# Patient Record
Sex: Female | Born: 1940 | Race: White | Hispanic: No | Smoking: Never smoker
Health system: Southern US, Community
[De-identification: ages and names within clinical notes are randomized; demographics above are authoritative.]

## PROBLEM LIST (undated history)

## (undated) DIAGNOSIS — K649 Unspecified hemorrhoids: Secondary | ICD-10-CM

## (undated) DIAGNOSIS — B029 Zoster without complications: Secondary | ICD-10-CM

## (undated) DIAGNOSIS — I10 Essential (primary) hypertension: Secondary | ICD-10-CM

## (undated) DIAGNOSIS — Z8719 Personal history of other diseases of the digestive system: Secondary | ICD-10-CM

## (undated) DIAGNOSIS — K635 Polyp of colon: Secondary | ICD-10-CM

## (undated) DIAGNOSIS — Z87898 Personal history of other specified conditions: Secondary | ICD-10-CM

## (undated) DIAGNOSIS — F419 Anxiety disorder, unspecified: Secondary | ICD-10-CM

## (undated) DIAGNOSIS — K5900 Constipation, unspecified: Secondary | ICD-10-CM

## (undated) DIAGNOSIS — M8580 Other specified disorders of bone density and structure, unspecified site: Secondary | ICD-10-CM

## (undated) DIAGNOSIS — Z91030 Bee allergy status: Secondary | ICD-10-CM

## (undated) DIAGNOSIS — M109 Gout, unspecified: Secondary | ICD-10-CM

## (undated) DIAGNOSIS — E871 Hypo-osmolality and hyponatremia: Secondary | ICD-10-CM

## (undated) DIAGNOSIS — E785 Hyperlipidemia, unspecified: Secondary | ICD-10-CM

## (undated) DIAGNOSIS — I4891 Unspecified atrial fibrillation: Secondary | ICD-10-CM

## (undated) HISTORY — DX: Unspecified hemorrhoids: K64.9

## (undated) HISTORY — DX: Other specified disorders of bone density and structure, unspecified site: M85.80

## (undated) HISTORY — DX: Zoster without complications: B02.9

## (undated) HISTORY — DX: Bee allergy status: Z91.030

## (undated) HISTORY — DX: Personal history of other diseases of the digestive system: Z87.19

## (undated) HISTORY — DX: Gout, unspecified: M10.9

## (undated) HISTORY — DX: Personal history of other specified conditions: Z87.898

## (undated) HISTORY — DX: Hypo-osmolality and hyponatremia: E87.1

## (undated) HISTORY — DX: Essential (primary) hypertension: I10

## (undated) HISTORY — DX: Anxiety disorder, unspecified: F41.9

## (undated) HISTORY — DX: Constipation, unspecified: K59.00

## (undated) HISTORY — DX: Hyperlipidemia, unspecified: E78.5

## (undated) HISTORY — DX: Polyp of colon: K63.5

---

## 1968-11-02 HISTORY — PX: ABDOMINAL HYSTERECTOMY: SHX81

## 1998-07-01 ENCOUNTER — Other Ambulatory Visit: Admission: RE | Admit: 1998-07-01 | Discharge: 1998-07-01 | Payer: Self-pay | Admitting: *Deleted

## 2000-03-15 ENCOUNTER — Encounter: Admission: RE | Admit: 2000-03-15 | Discharge: 2000-03-15 | Payer: Self-pay | Admitting: *Deleted

## 2001-03-16 ENCOUNTER — Encounter: Admission: RE | Admit: 2001-03-16 | Discharge: 2001-03-16 | Payer: Self-pay | Admitting: Family Medicine

## 2001-03-16 ENCOUNTER — Encounter: Payer: Self-pay | Admitting: Family Medicine

## 2002-03-30 ENCOUNTER — Encounter: Admission: RE | Admit: 2002-03-30 | Discharge: 2002-03-30 | Payer: Self-pay | Admitting: Family Medicine

## 2002-03-30 ENCOUNTER — Encounter: Payer: Self-pay | Admitting: Family Medicine

## 2003-03-20 ENCOUNTER — Encounter (INDEPENDENT_AMBULATORY_CARE_PROVIDER_SITE_OTHER): Payer: Self-pay | Admitting: Specialist

## 2003-03-20 ENCOUNTER — Ambulatory Visit (HOSPITAL_COMMUNITY): Admission: RE | Admit: 2003-03-20 | Discharge: 2003-03-20 | Payer: Self-pay | Admitting: Gastroenterology

## 2003-04-10 ENCOUNTER — Encounter: Payer: Self-pay | Admitting: Family Medicine

## 2003-04-10 ENCOUNTER — Encounter: Admission: RE | Admit: 2003-04-10 | Discharge: 2003-04-10 | Payer: Self-pay | Admitting: Family Medicine

## 2003-11-03 HISTORY — PX: BILATERAL SALPINGOOPHORECTOMY: SHX1223

## 2004-01-07 ENCOUNTER — Encounter: Admission: RE | Admit: 2004-01-07 | Discharge: 2004-01-07 | Payer: Self-pay | Admitting: Family Medicine

## 2004-01-23 ENCOUNTER — Other Ambulatory Visit: Admission: RE | Admit: 2004-01-23 | Discharge: 2004-01-23 | Payer: Self-pay | Admitting: Obstetrics and Gynecology

## 2004-01-30 ENCOUNTER — Ambulatory Visit: Admission: RE | Admit: 2004-01-30 | Discharge: 2004-01-30 | Payer: Self-pay | Admitting: Gynecology

## 2004-02-21 ENCOUNTER — Ambulatory Visit (HOSPITAL_COMMUNITY): Admission: RE | Admit: 2004-02-21 | Discharge: 2004-02-21 | Payer: Self-pay | Admitting: Gynecology

## 2004-02-26 ENCOUNTER — Ambulatory Visit (HOSPITAL_COMMUNITY): Admission: RE | Admit: 2004-02-26 | Discharge: 2004-02-26 | Payer: Self-pay | Admitting: Gynecology

## 2004-02-26 ENCOUNTER — Encounter (INDEPENDENT_AMBULATORY_CARE_PROVIDER_SITE_OTHER): Payer: Self-pay | Admitting: Specialist

## 2004-03-25 ENCOUNTER — Ambulatory Visit: Admission: RE | Admit: 2004-03-25 | Discharge: 2004-03-25 | Payer: Self-pay | Admitting: Gynecology

## 2004-04-10 ENCOUNTER — Encounter: Admission: RE | Admit: 2004-04-10 | Discharge: 2004-04-10 | Payer: Self-pay | Admitting: Family Medicine

## 2005-04-22 ENCOUNTER — Encounter: Admission: RE | Admit: 2005-04-22 | Discharge: 2005-04-22 | Payer: Self-pay | Admitting: Family Medicine

## 2006-06-04 ENCOUNTER — Encounter: Admission: RE | Admit: 2006-06-04 | Discharge: 2006-06-04 | Payer: Self-pay | Admitting: Family Medicine

## 2007-06-20 ENCOUNTER — Encounter: Admission: RE | Admit: 2007-06-20 | Discharge: 2007-06-20 | Payer: Self-pay | Admitting: Family Medicine

## 2008-06-20 ENCOUNTER — Encounter: Admission: RE | Admit: 2008-06-20 | Discharge: 2008-06-20 | Payer: Self-pay | Admitting: Family Medicine

## 2008-06-27 ENCOUNTER — Encounter: Admission: RE | Admit: 2008-06-27 | Discharge: 2008-06-27 | Payer: Self-pay | Admitting: Family Medicine

## 2009-03-01 ENCOUNTER — Ambulatory Visit (HOSPITAL_COMMUNITY): Admission: RE | Admit: 2009-03-01 | Discharge: 2009-03-01 | Payer: Self-pay | Admitting: Family Medicine

## 2009-06-28 ENCOUNTER — Ambulatory Visit (HOSPITAL_COMMUNITY): Admission: RE | Admit: 2009-06-28 | Discharge: 2009-06-28 | Payer: Self-pay | Admitting: Family Medicine

## 2009-07-02 ENCOUNTER — Encounter: Admission: RE | Admit: 2009-07-02 | Discharge: 2009-07-02 | Payer: Self-pay | Admitting: Diagnostic Radiology

## 2009-07-04 ENCOUNTER — Encounter: Admission: RE | Admit: 2009-07-04 | Discharge: 2009-07-04 | Payer: Self-pay | Admitting: Family Medicine

## 2009-11-07 ENCOUNTER — Emergency Department (HOSPITAL_COMMUNITY): Admission: EM | Admit: 2009-11-07 | Discharge: 2009-11-07 | Payer: Self-pay | Admitting: Emergency Medicine

## 2010-03-13 LAB — HM DEXA SCAN

## 2010-07-08 ENCOUNTER — Ambulatory Visit (HOSPITAL_COMMUNITY): Admission: RE | Admit: 2010-07-08 | Discharge: 2010-07-08 | Payer: Self-pay

## 2010-11-24 ENCOUNTER — Encounter: Payer: Self-pay | Admitting: Family Medicine

## 2011-01-18 LAB — DIFFERENTIAL
Basophils Absolute: 0.1 10*3/uL (ref 0.0–0.1)
Eosinophils Absolute: 0.2 10*3/uL (ref 0.0–0.7)
Eosinophils Relative: 1 % (ref 0–5)
Lymphocytes Relative: 31 % (ref 12–46)
Lymphs Abs: 3.6 10*3/uL (ref 0.7–4.0)
Monocytes Relative: 6 % (ref 3–12)
Neutro Abs: 7.1 10*3/uL (ref 1.7–7.7)
Neutrophils Relative %: 61 % (ref 43–77)

## 2011-01-18 LAB — BASIC METABOLIC PANEL
BUN: 14 mg/dL (ref 6–23)
CO2: 24 mEq/L (ref 19–32)
Calcium: 9.1 mg/dL (ref 8.4–10.5)
GFR calc Af Amer: >60 mL/min (ref >60)
Sodium: 132 mEq/L — ABNORMAL LOW (ref 135–145)

## 2011-01-18 LAB — CBC
MCV: 96.8 fL (ref 78.0–100.0)
RBC: 3.99 MIL/uL (ref 3.87–5.11)
WBC: 11.6 10*3/uL — ABNORMAL HIGH (ref 4.0–10.5)

## 2011-01-18 LAB — POCT CARDIAC MARKERS
CKMB, poc: 3.8 ng/mL (ref 1.0–8.0)
Myoglobin, poc: 277 ng/mL (ref 12–200)

## 2011-01-29 ENCOUNTER — Ambulatory Visit (INDEPENDENT_AMBULATORY_CARE_PROVIDER_SITE_OTHER): Payer: Medicare Other | Admitting: Family Medicine

## 2011-01-29 DIAGNOSIS — Z0000 Encounter for general adult medical examination without abnormal findings: Secondary | ICD-10-CM

## 2011-01-29 DIAGNOSIS — E559 Vitamin D deficiency, unspecified: Secondary | ICD-10-CM

## 2011-01-29 DIAGNOSIS — E871 Hypo-osmolality and hyponatremia: Secondary | ICD-10-CM

## 2011-01-29 DIAGNOSIS — Z01419 Encounter for gynecological examination (general) (routine) without abnormal findings: Secondary | ICD-10-CM

## 2011-01-29 DIAGNOSIS — I10 Essential (primary) hypertension: Secondary | ICD-10-CM

## 2011-03-20 NOTE — Op Note (Signed)
   NAME:  Monique James, Monique James                        ACCOUNT NO.:  [id number]   MEDICAL RECORD NO.:  [id number]                   PATIENT TYPE:  AMB   LOCATION:  ENDO                                 FACILITY:  [facility]   PHYSICIAN:  James L. Malon Kindle., M.D.          DATE OF BIRTH:  1941-01-17   DATE OF PROCEDURE:  03/16/2003  DATE OF DISCHARGE:                                 OPERATIVE REPORT   PROCEDURE:  Colonoscopy with coagulation of polyp.   MEDICATIONS:  Fentanyl 150 mcg, Versed 13 mg IV.   INDICATIONS FOR PROCEDURE:  The patient had a sigmoidoscopy done by Dr.  Manus Gunning with a small polyp biopsied at 40 cm that appears to be abnormal.  This procedure is done to evaluate the colon for further polyps.   DESCRIPTION OF PROCEDURE:  The procedure had been explained to the patient  and consent obtained. With the patient in the left lateral decubitus  position, the Olympus scope was inserted and advanced. The patient had a  long tortuous colon. It took some time to reach the cecum, finally in the  right lateral decubitus position, we were able to reach the cecum. The  ileocecal valve and appendiceal orifice were seen.  The scope was withdrawn  and the cecum, ascending colon, hepatic flexure, transverse colon, splenic  flexure, descending and sigmoid colon were seen well. After some time and  multiple positions, we were able to find the polyp it was about 3 mm and  sessile located about 40 cm. It was cauterized with the hot biopsy forceps.  The scope was withdrawn. There was no significant diverticular disease. No  other polyps were seen.   ASSESSMENT:  Sigmoid colon polyp cauterized.   PLAN:  Routine post polypectomy instructions. Would recommend repeating in  three years with the adult colonoscope.                                               James L. Malon Kindle., M.D.    Waldron Session  D:  03/20/2003  T:  03/20/2003  Job:  [id number]   cc:   Bryan Lemma. Manus Gunning, M.D.  [address]  [place]  [place] [address]  Fax: [phone]

## 2011-03-20 NOTE — Consult Note (Signed)
NAME:  Monique James, Monique James                        ACCOUNT NO.:  [id number]   MEDICAL RECORD NO.:  [id number]                   PATIENT TYPE:  OUT   LOCATION:  GYN                                  FACILITY:  [facility]   PHYSICIAN:  De Blanch, M.D.         DATE OF BIRTH:  Jan 20, 1941   DATE OF CONSULTATION:  03/25/2004  DATE OF DISCHARGE:                                   CONSULTATION   IDENTIFYING INFORMATION:  Return patient postoperative note.   REASON FOR CONSULTATION:  A 70 year old white female returns for  postoperative checkup.  She underwent laparoscopic bilateral salpingo-  oophorectomy on April 26th.  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  No evidence of malignancy was noted.  The patient has had  an uncomplicated postoperative course.  She denies any pelvic pain,  pressure, any incisional pain, appetite is good.  She has no GI or GU  symptoms.   PHYSICAL EXAMINATION:  ABDOMEN:  Soft, nontender.  No masses, organomegaly,  ascites, or hernias are noted.  Her 4 laparoscopic incisions are healing  well.  PELVIC:  EGBUS, vagina, bladder, urethra are normal.  Cervix and uterus  surgically absent.  Bimanual reveals no masses, nodularity, or fluid  collections.   IMPRESSION:  Status post bilateral salpingo-oophorectomy (laparoscopic) with  excellent postoperative recovery.  The patient is given the okay to return  to full levels of activity including that she can go return to work  tomorrow.   With regard to follow up, we return her to the care of her primary physician  for annual gynecologic exams and well-woman care.                                               De Blanch, M.D.    DC/MEDQ  D:  03/25/2004  T:  03/25/2004  Job:  [id number]   cc:   Telford Nab, R.N.  [address]  [place], [place] [address]

## 2011-03-20 NOTE — Consult Note (Signed)
NAME:  Monique James, Monique James                        ACCOUNT NO.:  [id number]   MEDICAL RECORD NO.:  [id number]                   PATIENT TYPE:  OUT   LOCATION:  GYN                                  FACILITY:  [facility]   PHYSICIAN:  De Blanch, M.D.         DATE OF BIRTH:  02/04/1941   DATE OF CONSULTATION:  01/30/2004  DATE OF DISCHARGE:  01/30/2004                                   CONSULTATION   REASON FOR CONSULTATION:  A 70 year old white female seen in consultation at  the request of Dr. Dois Davenport A. Rivard regarding management of complex pelvic  mass.  The patient was found to have an incidentally enlarged ovary on  annual exam.  Subsequently underwent an ultrasound examination that showed a  complex cystic structure measuring 5.8 x 5.4 x 6 cm.  There are internal  septations and at least one single nodule.  The patient is essentially  asymptomatic.   PAST MEDICAL HISTORY:  1. Hypertension.  2. Elevated cholesterol.   PAST SURGICAL HISTORY:  In 1971, total abdominal hysterectomy.   CURRENT MEDICATIONS:  1. Cardura.  2. Lotrel.  3. Premarin.  4. Baby aspirin.   ALLERGIES:  1. VANCOMYCIN.  2. IVP DYE.  3. CODEINE.  4. PENICILLIN (nausea).  5. EPINEPHRINE (elevated heart rate).  6. BEE STINGS.  7. SULFA (rash).   FAMILY HISTORY:  An aunt with breast cancer.  There is no other gynecologic  or colon cancer in the family history.   OBSTETRICAL HISTORY:  Gravida 3.   SOCIAL HISTORY:  The patient does not smoke.   REVIEW OF SYSTEMS:  Negative except as noted above.   PHYSICAL EXAMINATION:  VITAL SIGNS:  Weight 200 pounds, height 5 feet 5  inches, blood pressure 150/90.  GENERAL:  The patient is an obese white female in no acute distress.  HEENT:  Negative.  NECK:  Supple without thyromegaly.  There is no supraclavicular or inguinal  adenopathy.  ABDOMEN:  Obese, soft, nontender.  No mass, organomegaly, ascites, or  hernias noted.  The Pfannenstiel incision is  well-healed.  PELVIC:  EGBUS, vagina, bladder, and urethra are normal.  The cuff is white  female.  No lesions are noted.  Bimanual and rectovaginal exam reveal some  fullness at the apex of the vagina without a discrete mass which is probably  hidden by the patient's obesity.   IMPRESSION:  Complex pelvic mass in a menopausal patient.  Her CA-125 is  5.6.   PLAN:  I would recommend the patient undergo surgery to resect the mass.  I  think it would be reasonable to attempt this laparoscopically with the plan  of avoiding rupture by placing the ovary and cyst in an Endo bag and  extracting it through the suprapubic site.  The patient understands that if  this cannot be accomplished, laparotomy would be required.   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  The risks of surgery, including  hemorrhage, infection, injury to adjacent viscera, thrombo-embolic  complications, and anesthetic risks were all discussed with the patient.  She accepts these risks.  We will contact Dr. Estanislado Pandy and coordinate surgery.                                               De Blanch, M.D.    DC/MEDQ  D:  02/04/2004  T:  02/05/2004  Job:  [id number]   cc:   Dois Davenport A. Rivard, M.D.  [address]  [place]  [place] [address]  Fax: [phone]   Telford Nab, R.N.  [phone] [address]  [place], [place] [address]

## 2011-03-20 NOTE — Op Note (Signed)
NAME:  Monique James, Monique James                        ACCOUNT NO.:  [id number]   MEDICAL RECORD NO.:  [id number]                   PATIENT TYPE:  AMB   LOCATION:  DAY                                  FACILITY:  [facility]   PHYSICIAN:  De Blanch, M.D.         DATE OF BIRTH:  22-Feb-1941   DATE OF PROCEDURE:  DATE OF DISCHARGE:                                 OPERATIVE REPORT   PREOPERATIVE DIAGNOSIS:  Complex pelvic mass.   POSTOPERATIVE DIAGNOSIS:  Right ovarian cyst adenofibroma.   PROCEDURE:  Laparoscopic bilateral salpingo-oophorectomy and lysis of  adhesions.   SURGEON:  De Blanch, M.D.   ASSISTANT:  Telford Nab, R.N.   ANESTHESIA:  General with orotracheal tube.   ESTIMATED BLOOD LOSS:  10 cc.   SURGICAL FINDINGS:  At the time of laparoscopy, the upper abdomen, including  the liver, stomach, spleen, omentum, and diaphragms were normal.  The small  bowel appeared normal.  The right ovary was replaced by a 6 cm ovarian cyst,  which was densely adherent in the pelvic sidewall to the vaginal cuff.  The  left tube and ovary appeared essentially normal but were adherent to the  pelvic side wall as well.   PROCEDURE:  The patient was brought to the operating room and after  satisfactory obtainment of general anesthesia, was placed in a modified  lithotomy position in Lyman stirrups.  Her arms were carefully placed at her  side and wrapped and padded.  The anterior abdominal wall, perineum, and  vagina were prepped with Betadine.  A Foley catheter was inserted.  A sponge  stick was placed into the vagina for manipulation, and the patient was  draped.  A subumbilical incision was made, and the peritoneal cavity entered  directly.  The Hasson cannula was placed, and a laparoscope was placed.  The  peritoneal cavity was insufflated, and the pelvis and abdomen explored at  the above-noted findings.  A 13 mm suprapubic port was then placed under  direct  visualization along with two 5 mm ports placed laterally.  The  inferior epigastric vessels were visualized throughout, and the ports were  placed lateral to the vessels.  Peritoneal washings were obtained and sent  for cytopathology.  Patient was placed in steep Trendelenburg position.  The  right pelvic side wall peritoneum was opened.  I identified the external  iliac artery and the ureter.  The ovarian vessels were skeletonized, then  divided and controlled using an Endo GIA stapler.  The peritoneum on the  pelvic side wall was then incised beneath the cyst.  Incision of the  peritoneum extended caudad overlying the bladder flap.  The retroperitoneal  space was further opened.  Further dissection was required in a  circumferential manner around the cyst.  This cyst was densely adherent to  the vaginal cuff.  This is divided using monopolar cautery and Endo shears.  Ultimately, the cyst was totally freed from all attachments.  An EndoCatch  bag was placed in the peritoneal cavity, and the cyst placed in it.  The  cyst was drained and then removed from the peritoneal cavity.  Frozen  section was obtained, showing this to be a benign ovarian cyst adenofibroma.   Attention was turned to the left pelvic side wall.  The lateral peritoneum  was incised.  The vessels were identified.  The ovarian vessels were the  skeletonized and then divided with the Endo GIA stapler.  The peritoneum on  the medial aspect of the pelvic side wall was then incised until the  fallopian tube and ovary were entirely excised.  The pelvis was irrigated  with saline and found to be hemostatic.  Intraperitoneal pressure was  dropped to 8 mmHg, and the pelvis reinspected.  No bleeding was noted.  A #1  Vicryl suture was then placed to close the fascia in the suprapubic site to  avoid subsequent hernias.  The suprapubic port and the lateral ports were  removed under direct visualization.  The pneumoperitoneum was  reduced, and  the umbilical trocar removed.  The umbilical fascia was closed with  interrupted figure-of-eight sutures of 0 Vicryl.  The skin was close with  interrupted subcuticular sutures of 3-0 Vicryl.  Steri-Strips were applied.  Patient was awakened from anesthesia and taken to the recovery room in  satisfactory condition.  Sponge, needle, and instrument counts were correct  x2.                                               De Blanch, M.D.    DC/MEDQ  D:  02/26/2004  T:  02/26/2004  Job:  [id number]   cc:   Dois Davenport A. Rivard, M.D.  [address]  [place]  [place] [address]  Fax: [phone]   Telford Nab, R.N.  [phone] [address]  [place], [place] [address]

## 2011-07-07 ENCOUNTER — Other Ambulatory Visit: Payer: Self-pay | Admitting: Family Medicine

## 2011-07-07 DIAGNOSIS — Z139 Encounter for screening, unspecified: Secondary | ICD-10-CM

## 2011-07-14 ENCOUNTER — Ambulatory Visit (HOSPITAL_COMMUNITY)
Admission: RE | Admit: 2011-07-14 | Discharge: 2011-07-14 | Disposition: A | Discharge status: Home / Self Care | Payer: Medicare Other | Source: Ambulatory Visit | Attending: Family Medicine | Admitting: Family Medicine

## 2011-07-14 DIAGNOSIS — Z1231 Encounter for screening mammogram for malignant neoplasm of breast: Secondary | ICD-10-CM | POA: Insufficient documentation

## 2011-07-14 DIAGNOSIS — Z139 Encounter for screening, unspecified: Secondary | ICD-10-CM

## 2011-07-28 ENCOUNTER — Encounter: Payer: Self-pay | Admitting: Family Medicine

## 2011-07-30 ENCOUNTER — Encounter: Payer: Self-pay | Admitting: Family Medicine

## 2011-07-30 ENCOUNTER — Ambulatory Visit (INDEPENDENT_AMBULATORY_CARE_PROVIDER_SITE_OTHER): Payer: Medicare Other | Admitting: Family Medicine

## 2011-07-30 VITALS — BP 150/80 | HR 68 | Ht 64.0 in | Wt 203.0 lb

## 2011-07-30 DIAGNOSIS — I10 Essential (primary) hypertension: Secondary | ICD-10-CM

## 2011-07-30 DIAGNOSIS — E871 Hypo-osmolality and hyponatremia: Secondary | ICD-10-CM

## 2011-07-30 MED ORDER — VALSARTAN 160 MG PO TABS: 160.0000 mg | ORAL_TABLET | Freq: Every day | ORAL | Status: DC

## 2011-07-30 NOTE — Progress Notes (Signed)
Patient presents for 6 month f/u on hypertension.  BP's at home running 124-133/63-73.  Denies headaches, dizziness, palpitations, chest pain, SOB, edema.  Patient has long standing h/o low sodiums.  Denies excessive water intake, denies any symptoms  Past Medical History  Diagnosis Date  . Hypertension   . Macular degeneration of right eye     Dr. Denman George (mild)  . Colon polyps     Dr. Randa Evens  . Osteoporosis     (awaiting [facility] records for DEXA results)    Past Surgical History  Procedure Date  . Bilateral salpingoophorectomy 2005    benign tumor/cyst (Dr. Loree Fee)  . Abdominal hysterectomy 1970    for perforated uterus from IUD    History   Social History  . Marital Status: Widowed    Spouse Name: N/A    Number of Children: 3  . Years of Education: N/A   Occupational History  . retired [employer] fundraiser    Social History Main Topics  . Smoking status: Never Smoker   . Smokeless tobacco: Never Used  . Alcohol Use: Yes     1-2 glass of wine daily.  . Drug Use: No  . Sexually Active: Not on file   Other Topics Concern  . Not on file   Social History Narrative   Lives alone, 2 dogs    Family History  Problem Relation Age of Onset  . Hypertension Mother   . Heart disease Mother     MI  . Dementia Mother   . Cancer Father     stomach  . Cancer Brother     multiple myeloma  . Diabetes Neg Hx     Current outpatient prescriptions:aspirin 81 MG tablet, Take 81 mg by mouth daily.  , Disp: , Rfl: ;  Calcium Carbonate-Vitamin D (CALCIUM 600+D) 600-400 MG-UNIT per tablet, Take 2 tablets by mouth daily.  , Disp: , Rfl: ;  Multiple Vitamins-Minerals (MULTIVITAMIN WITH MINERALS) tablet, Take 1 tablet by mouth daily.  , Disp: , Rfl: ;  valsartan (DIOVAN) 160 MG tablet, Take 1 tablet (160 mg total) by mouth daily., Disp: 30 tablet, Rfl: 5 DISCONTD: valsartan (DIOVAN) 160 MG tablet, Take 160 mg by mouth daily.  , Disp: , Rfl:   Allergies  Allergen Reactions  . Bee Venom  Anaphylaxis  . Epinephrine Other (See Comments)    High pulse rate.  . Sulfa Antibiotics Nausea And Vomiting  . Vancomycin Nausea And Vomiting  . Influenza Vaccine Live Swelling and Rash  . Ivp Dye (Iodinated Diagnostic Agents) Rash  . Zyloprim (Allopurinol) Rash   ROS: Denies fever, URI symptoms, cough, SOB, rash, GI complaints or other concerns.  Denies confusion or mental status issues  PHYSICAL EXAM: BP 150/80  Pulse 68  Ht 5\' 4"  (1.626 m)  Wt 203 lb (92.08 kg)  BMI 34.84 kg/m2 Well developed, pleasant, overweight female in no distress Neck: no lymphadenopathy, thyromegaly or mass Heart: regular rate and rhythm without murmur Lungs: clear bilaterally without wheezes, rales or ronchi Abdomen: soft, nontender, no mass Extremities: no clubbing, cyanosis or edema Skin: no rash Psych: normal mood, affect, hygiene and grooming  ASSESSMENT/PLAN; 1. Essential hypertension, benign  valsartan (DIOVAN) 160 MG tablet, Basic metabolic panel   well controlled per home numbers  2. Hyponatremia  Basic metabolic panel   HTN--well controlled Hyponatremia--re-check  Records from [facility] never came--will resent [facility] form

## 2011-07-30 NOTE — Patient Instructions (Signed)
Weight loss is encouraged. Daily exercise, of at least 30-60 minutes cardio/aerobic exercise Continue current medications.  We will try again to get records from [facility]

## 2011-07-31 ENCOUNTER — Encounter: Payer: Self-pay | Admitting: Family Medicine

## 2011-07-31 LAB — BASIC METABOLIC PANEL
BUN: 11 mg/dL (ref 6–23)
Calcium: 9.6 mg/dL (ref 8.4–10.5)
Creat: 0.65 mg/dL (ref 0.50–1.10)
Glucose, Bld: 82 mg/dL (ref 70–99)

## 2011-09-10 ENCOUNTER — Other Ambulatory Visit (HOSPITAL_COMMUNITY): Payer: Self-pay | Admitting: General Surgery

## 2011-09-15 ENCOUNTER — Ambulatory Visit (HOSPITAL_COMMUNITY): Admission: RE | Admit: 2011-09-15 | Payer: Medicare Other | Source: Ambulatory Visit

## 2011-09-16 ENCOUNTER — Ambulatory Visit (HOSPITAL_COMMUNITY)
Admission: RE | Admit: 2011-09-16 | Discharge: 2011-09-16 | Disposition: A | Discharge status: Home / Self Care | Payer: Medicare Other | Source: Ambulatory Visit | Attending: General Surgery | Admitting: General Surgery

## 2011-09-16 DIAGNOSIS — R229 Localized swelling, mass and lump, unspecified: Secondary | ICD-10-CM | POA: Insufficient documentation

## 2011-09-16 DIAGNOSIS — M7120 Synovial cyst of popliteal space [Baker], unspecified knee: Secondary | ICD-10-CM | POA: Insufficient documentation

## 2011-09-16 DIAGNOSIS — M25469 Effusion, unspecified knee: Secondary | ICD-10-CM | POA: Insufficient documentation

## 2011-09-16 DIAGNOSIS — R937 Abnormal findings on diagnostic imaging of other parts of musculoskeletal system: Secondary | ICD-10-CM | POA: Insufficient documentation

## 2011-09-16 LAB — CREATININE, SERUM
Creatinine, Ser: 0.79 mg/dL (ref 0.50–1.10)
GFR calc Af Amer: >90 mL/min (ref >90)
GFR calc non Af Amer: 82 mL/min — ABNORMAL LOW (ref >90)

## 2011-09-16 MED ORDER — GADOBENATE DIMEGLUMINE 529 MG/ML IV SOLN: 20.0000 mL | Freq: Once | INTRAVENOUS | Status: AC | PRN

## 2012-01-25 ENCOUNTER — Telehealth: Payer: Self-pay | Admitting: Internal Medicine

## 2012-01-25 DIAGNOSIS — I10 Essential (primary) hypertension: Secondary | ICD-10-CM

## 2012-01-25 MED ORDER — VALSARTAN 160 MG PO TABS: 160.0000 mg | ORAL_TABLET | Freq: Every day | ORAL | Status: DC

## 2012-01-25 NOTE — Telephone Encounter (Signed)
Has appt 4/4.  Refilled #30.  Will due additional refills at [facility]

## 2012-02-04 ENCOUNTER — Ambulatory Visit (INDEPENDENT_AMBULATORY_CARE_PROVIDER_SITE_OTHER): Payer: BLUE CROSS/BLUE SHIELD | Admitting: Family Medicine

## 2012-02-04 ENCOUNTER — Encounter: Payer: Self-pay | Admitting: Family Medicine

## 2012-02-04 VITALS — BP 140/90 | HR 64 | Ht 64.0 in | Wt 207.0 lb

## 2012-02-04 DIAGNOSIS — Z0000 Encounter for general adult medical examination without abnormal findings: Secondary | ICD-10-CM

## 2012-02-04 DIAGNOSIS — I10 Essential (primary) hypertension: Secondary | ICD-10-CM

## 2012-02-04 LAB — COMPREHENSIVE METABOLIC PANEL
ALT: 13 U/L (ref 0–35)
Albumin: 4.4 g/dL (ref 3.5–5.2)
Alkaline Phosphatase: 78 U/L (ref 39–117)
Glucose, Bld: 91 mg/dL (ref 70–99)
Potassium: 4.4 mEq/L (ref 3.5–5.3)
Sodium: 134 mEq/L — ABNORMAL LOW (ref 135–145)
Total Bilirubin: 0.4 mg/dL (ref 0.3–1.2)
Total Protein: 7.4 g/dL (ref 6.0–8.3)

## 2012-02-04 LAB — TSH: TSH: 1.947 u[IU]/mL (ref 0.350–4.500)

## 2012-02-04 LAB — LIPID PANEL
LDL Cholesterol: 133 mg/dL — ABNORMAL HIGH (ref 0–99)
Triglycerides: 62 mg/dL (ref <150)
VLDL: 12 mg/dL (ref 0–40)

## 2012-02-04 LAB — POCT URINALYSIS DIPSTICK
Bilirubin, UA: NEGATIVE
Nitrite, UA: NEGATIVE
pH, UA: 5

## 2012-02-04 MED ORDER — LOSARTAN POTASSIUM 100 MG PO TABS: 100.0000 mg | ORAL_TABLET | Freq: Every day | ORAL | Status: DC

## 2012-02-04 NOTE — Patient Instructions (Addendum)
HEALTH MAINTENANCE RECOMMENDATIONS:  It is recommended that you get at least 30 minutes of aerobic exercise at least 5 days/week (for weight loss, you may need as much as 60-90 minutes). This can be any activity that gets your heart rate up. This can be divided in 10-15 minute intervals if needed, but try and build up your endurance at least once a week.  Weight bearing exercise is also recommended twice weekly.  Eat a healthy diet with lots of vegetables, fruits and fiber.  "Colorful" foods have a lot of vitamins (ie green vegetables, tomatoes, red peppers, etc).  Limit sweet tea, regular sodas and alcoholic beverages, all of which has a lot of calories and sugar.  Up to 1 alcoholic drink daily may be beneficial for women (unless trying to lose weight, watch sugars).  Drink a lot of water.  Calcium recommendations are 1200-1500 mg daily (1500 mg for postmenopausal women or women without ovaries), and vitamin D 1000 IU daily.  This should be obtained from diet and/or supplements (vitamins), and calcium should not be taken all at once, but in divided doses. MAKE SURE YOUR TOTAL INTAKE IS 1500MG , NOT MORE--YOU MAY NEED TO TAKE JUST 1 CALCIUM SUPPLEMENT, DEPENDING ON AMOUNTS IN YOUR VITAMINS AND YOUR DIET  Monthly self breast exams and yearly mammograms for women over the age of 32 is recommended.  Sunscreen of at least SPF 30 should be used on all sun-exposed parts of the skin when outside between the hours of 10 am and 4 pm (not just when at beach or pool, but even with exercise, golf, tennis, and yard work!)  Use a sunscreen that says "broad spectrum" so it covers both UVA and UVB rays, and make sure to reapply every 1-2 hours.  Remember to change the batteries in your smoke detectors when changing your clock times in the spring and fall.  Use your seat belt every time you are in a car, and please drive safely and not be distracted with cell phones and texting while driving.

## 2012-02-04 NOTE — Progress Notes (Signed)
Monique James is a 71 y.o. female who presents for a complete physical.  She has the following concerns:  She is here for physical and to f/u on HTN.  Review of chart shows that her records from [facility] were never received.  BP's have been running 127-137/73-82, pulse 73-82.  Denies headaches, dizziness, chest pain, palpitations. Cost of her med went from $30 to $70/month, wondering if it could be changed to less expensive med.  She noticed a hard lump in her leg in November--had MRI which showed a large Baker's cyst.  Lump eventually resolved. Saw ortho in [place].  Health Maintenance: There is no immunization history on file for this patient. She is allergic to flu shots, and reports allergy to tetanus, last shot in the 70's. She has declined pneumovax and zostavax in previous years. Last Pap smear: n/a, s/p hysterectomy Last mammogram: 07/16/11 Last colonoscopy: due again May, 2013 (h/o polyps, last probably 2008) Last DEXA: done at [facility]--?date and results Dentist: twice yearly Ophtho: appt in 2 weeks, goes every 6 months Exercise: water aerobics 3x/week for an hour.  Past Medical History  Diagnosis Date  . Hypertension   . Macular degeneration of right eye     Dr. Denman George (mild)  . Colon polyps     Dr. Randa Evens  . Osteoporosis     (awaiting [facility] records for DEXA results)    Past Surgical History  Procedure Date  . Bilateral salpingoophorectomy 2005    benign tumor/cyst (Dr. Loree Fee)  . Abdominal hysterectomy 1970    for perforated uterus from IUD    History   Social History  . Marital Status: Widowed    Spouse Name: N/A    Number of Children: 3  . Years of Education: N/A   Occupational History  . retired Charity fundraiser    Social History Main Topics  . Smoking status: Never Smoker   . Smokeless tobacco: Never Used  . Alcohol Use: Yes     1-2 glass of wine daily.  . Drug Use: No  . Sexually Active: Not on file   Other Topics Concern  . Not on file   Social  History Narrative   Lives alone, 2 dogs    Family History  Problem Relation Age of Onset  . Hypertension Mother   . Heart disease Mother     MI  . Dementia Mother   . Cancer Father     stomach  . Cancer Brother     multiple myeloma  . Diabetes Neg Hx    Current Outpatient Prescriptions on File Prior to Visit  Medication Sig Dispense Refill  . aspirin 81 MG tablet Take 81 mg by mouth daily.        . Calcium Carbonate-Vitamin D (CALCIUM 600+D) 600-400 MG-UNIT per tablet Take 2 tablets by mouth daily.        . Multiple Vitamins-Minerals (MULTIVITAMIN WITH MINERALS) tablet Take 1 tablet by mouth daily.        [name] Kitchen losartan (COZAAR) 100 MG tablet Take 1 tablet (100 mg total) by mouth daily.  30 tablet  2  Diovan 160mg  (switched to losartan today)  Allergies  Allergen Reactions  . Bee Venom Anaphylaxis  . Epinephrine Other (See Comments)    High pulse rate.  . Sulfa Antibiotics Nausea And Vomiting  . Vancomycin Nausea And Vomiting  . Adhesive (Tape) Rash    Her skin peels off.  . Influenza Vaccine Live Swelling and Rash  . Ivp Dye (Iodinated Diagnostic  Agents) Rash  . Zyloprim (Allopurinol) Rash   ROS:  The patient denies anorexia, fever, headaches,  vision changes, decreased hearing, ear pain, sore throat, breast concerns, chest pain, palpitations, dizziness, syncope, dyspnea on exertion, cough, swelling, nausea, vomiting, diarrhea, constipation, abdominal pain, melena, hematochezia, indigestion/heartburn, hematuria, incontinence, dysuria, vaginal bleeding, discharge, odor or itch, genital lesions, joint pains, numbness, tingling, weakness, tremor, suspicious skin lesions, depression, anxiety, abnormal bleeding/bruising, or enlarged lymph nodes. +arthritis in hands and R knee +weight gain  PHYSICAL EXAM: BP 140/90  Pulse 64  Ht 5\' 4"  (1.626 m)  Wt 207 lb (93.895 kg)  BMI 35.53 kg/m2  General Appearance:    Alert, cooperative, no distress, appears stated age  Head:     Normocephalic, without obvious abnormality, atraumatic  Eyes:    PERRL, conjunctiva/corneas clear, EOM's intact, fundi    benign  Ears:    Normal TM's and external ear canals  Nose:   Nares normal, mucosa normal, no drainage or sinus   tenderness  Throat:   Lips, mucosa, and tongue normal; teeth and gums normal  Neck:   Supple, no lymphadenopathy;  thyroid:  no   enlargement/tenderness/nodules; no carotid   bruit or JVD  Back:    Spine nontender, no curvature, ROM normal, no CVA     tenderness  Lungs:     Clear to auscultation bilaterally without wheezes, rales or     ronchi; respirations unlabored  Chest Wall:    No tenderness or deformity   Heart:    Regular rate and rhythm, S1 and S2 normal, no murmur, rub   or gallop  Breast Exam:    No tenderness, masses, or nipple discharge or inversion.      No axillary lymphadenopathy  Abdomen:     Soft, non-tender, nondistended, normoactive bowel sounds,    no masses, no hepatosplenomegaly  Genitalia:    Normal external genitalia without lesions.  BUS and vagina normal;  No abnormal vaginal discharge.  Bimanual exam was normal, without any masses appreciable, some slight scar tissue present at R adnexa.  Rectal:    Normal tone, no masses or tenderness; guaiac negative stool  Extremities:   No clubbing, cyanosis or edema  Pulses:   2+ and symmetric all extremities  Skin:   Skin color, texture, turgor normal, no rashes or lesions  Lymph nodes:   Cervical, supraclavicular, and axillary nodes normal  Neurologic:   CNII-XII intact, normal strength, sensation and gait; reflexes 2+ and symmetric throughout          Psych:   Normal mood, affect, hygiene and grooming.    ASSESSMENT/PLAN: 1. Routine general medical examination at a health care facility  POCT Urinalysis Dipstick, Visual acuity screening, TSH  2. Essential hypertension, benign  losartan (COZAAR) 100 MG tablet, Comprehensive metabolic panel, Lipid panel    HTN--cost of medication increased.   Trial changing to losartan 100mg .  She will continue monitoring her BP's, and if they rise >140/90 at home on a regular basis, then will need to switch back to Diovan, or see if there is another preferred ARB from her insurance company. Given just 3 months, so she will be required to notify us of her blood pressure results in order to get refills.  Weight gain--discussed adequate exercise. Restart recumbent bike. Cut back on alcohol.  Await [facility] DEXA results. If it showed osteopenia, then recommend repeat exam --order at [facility], as is convenient for pt and Deboraha Sprang will not allow a f/u exam bc she  isn't [facility] pt anymore.  ADDENDUM:  DEXA results from 03/2010--T-1.2 at L neck.  Only very mild osteopenia.  Doesn't need to be repeated at this time.  Discussed monthly self breast exams and yearly mammograms after the age of 26; at least 30 minutes of aerobic activity at least 5 days/week; proper sunscreen use reviewed; healthy diet, including goals of calcium and vitamin D intake and alcohol recommendations (less than or equal to 1 drink/day) reviewed; regular seatbelt use; changing batteries in smoke detectors.  Immunization recommendations discussed--refuses zostavax and pneumovax.  Colonoscopy recommendations reviewed--due next month

## 2012-02-05 ENCOUNTER — Encounter: Payer: Self-pay | Admitting: Family Medicine

## 2012-02-22 ENCOUNTER — Encounter: Payer: Self-pay | Admitting: Family Medicine

## 2012-04-13 LAB — HM COLONOSCOPY

## 2012-04-27 ENCOUNTER — Telehealth: Payer: Self-pay | Admitting: Family Medicine

## 2012-04-27 DIAGNOSIS — I10 Essential (primary) hypertension: Secondary | ICD-10-CM

## 2012-04-27 MED ORDER — LOSARTAN POTASSIUM 100 MG PO TABS: 100.0000 mg | ORAL_TABLET | Freq: Every day | ORAL | Status: DC

## 2012-04-27 NOTE — Telephone Encounter (Signed)
done

## 2012-07-13 ENCOUNTER — Other Ambulatory Visit: Payer: Self-pay | Admitting: Family Medicine

## 2012-07-13 DIAGNOSIS — Z139 Encounter for screening, unspecified: Secondary | ICD-10-CM

## 2012-07-19 ENCOUNTER — Ambulatory Visit (HOSPITAL_COMMUNITY)
Admission: RE | Admit: 2012-07-19 | Discharge: 2012-07-19 | Disposition: A | Discharge status: Home / Self Care | Payer: Medicare Other | Source: Ambulatory Visit | Attending: Family Medicine | Admitting: Family Medicine

## 2012-07-19 DIAGNOSIS — Z1231 Encounter for screening mammogram for malignant neoplasm of breast: Secondary | ICD-10-CM | POA: Insufficient documentation

## 2012-07-19 DIAGNOSIS — Z139 Encounter for screening, unspecified: Secondary | ICD-10-CM

## 2012-08-04 ENCOUNTER — Ambulatory Visit (INDEPENDENT_AMBULATORY_CARE_PROVIDER_SITE_OTHER): Payer: Medicare Other | Admitting: Family Medicine

## 2012-08-04 ENCOUNTER — Encounter: Payer: Self-pay | Admitting: Family Medicine

## 2012-08-04 VITALS — BP 140/94 | HR 68 | Ht 64.0 in | Wt 203.0 lb

## 2012-08-04 DIAGNOSIS — I10 Essential (primary) hypertension: Secondary | ICD-10-CM

## 2012-08-04 MED ORDER — LOSARTAN POTASSIUM 100 MG PO TABS: 100.0000 mg | ORAL_TABLET | Freq: Every day | ORAL | Status: DC

## 2012-08-04 NOTE — Patient Instructions (Addendum)
BP's at home look good.  Continue to check periodically, and bring a list to your next appointment.  Also bring your monitor to your physical in April, just to make sure it is accurate  Try and get at least 30-60 minutes every day, and continue to try and lose weight.

## 2012-08-04 NOTE — Progress Notes (Signed)
Chief Complaint  Patient presents with  . Hypertension    6 month follow up. Pt declines flu vaccine as she is allergic-see list.   Hypertension follow-up:  Blood pressures elsewhere are 130's/70's (125-141/65-83), pulse 70-82.  Denies dizziness, headaches, chest pain.  Denies side effects of medications..  She was changed from Diovan to losartan due to cost, about 3 months ago.  BP's have remained about the same (slightly better) than when on Diovan.  She denies other complaints.  Past Medical History  Diagnosis Date  . Hypertension   . Macular degeneration of right eye     Dr. Denman George (mild)  . Colon polyps     Dr. Randa Evens  . Osteoporosis     (awaiting [facility] records for DEXA results)   Past Surgical History  Procedure Date  . Bilateral salpingoophorectomy 2005    benign tumor/cyst (Dr. Loree Fee)  . Abdominal hysterectomy 1970    for perforated uterus from IUD   History   Social History  . Marital Status: Widowed    Spouse Name: N/A    Number of Children: 3  . Years of Education: N/A   Occupational History  . retired Charity fundraiser    Social History Main Topics  . Smoking status: Never Smoker   . Smokeless tobacco: Never Used  . Alcohol Use: Yes     1-2 glass of wine daily.  . Drug Use: No  . Sexually Active: Not on file   Other Topics Concern  . Not on file   Social History Narrative   Lives alone, 2 dogs   Current Outpatient Prescriptions on File Prior to Visit  Medication Sig Dispense Refill  . aspirin 81 MG tablet Take 81 mg by mouth daily.        . Calcium Carbonate-Vitamin D (CALCIUM 600+D) 600-400 MG-UNIT per tablet Take 2 tablets by mouth daily.        [name] Kitchen losartan (COZAAR) 100 MG tablet Take 1 tablet (100 mg total) by mouth daily.  30 tablet  5  . Multiple Vitamins-Minerals (HAIR/SKIN/NAILS PO) Take by mouth.      . Multiple Vitamins-Minerals (MULTIVITAMIN WITH MINERALS) tablet Take 1 tablet by mouth daily.        . multivitamin-lutein (OCUVITE-LUTEIN) CAPS  Take 1 capsule by mouth daily.       Allergies  Allergen Reactions  . Bee Venom Anaphylaxis  . Epinephrine Other (See Comments)    High pulse rate.  . Sulfa Antibiotics Nausea And Vomiting  . Tetanus Toxoids Swelling    Entire arm swelled  . Vancomycin Nausea And Vomiting  . Adhesive (Tape) Rash    Her skin peels off.  . Influenza Vaccine Live Swelling and Rash  . Ivp Dye (Iodinated Diagnostic Agents) Rash  . Zyloprim (Allopurinol) Rash   ROS:  Denies fever, URI symptoms, cough, shortness of breath, headaches, dizziness, edema, urinary complaints, joint pains, skin rash, chest pain, palpitations or other concerns.  PHYSICAL EXAM: BP 140/94  Pulse 68  Ht 5\' 4"  (1.626 m)  Wt 203 lb (92.08 kg)  BMI 34.84 kg/m2 150/84 (excited in discussing granddaughter's visit) on repeat by MD RA Well developed, pleasant, overweight female in no distress Neck: no lymphadenopathy, thyromegaly or mass Heart: regular rate and rhythm without murmur Lungs: clear bilaterally Abdomen: soft, nontender Extremities: no edema, 2+ pulse Skin: no rash Neuro: grossly normal Psych: normal mood  ASSESSMENT/PLAN: 1. Essential hypertension, benign  losartan (COZAAR) 100 MG tablet  HTN well controlled on current regimen  F/u 6 months CPE, fasting (labs to be drawn at visit)  Declines pneumovax.  Allergic to tetanus and flu shots. Declines shingles vaccine

## 2012-08-18 ENCOUNTER — Encounter: Payer: Self-pay | Admitting: *Deleted

## 2013-02-02 ENCOUNTER — Encounter: Payer: Self-pay | Admitting: Family Medicine

## 2013-02-02 ENCOUNTER — Ambulatory Visit (INDEPENDENT_AMBULATORY_CARE_PROVIDER_SITE_OTHER): Payer: Medicare Other | Admitting: Family Medicine

## 2013-02-02 ENCOUNTER — Encounter (INDEPENDENT_AMBULATORY_CARE_PROVIDER_SITE_OTHER): Payer: Medicare Other | Admitting: Family Medicine

## 2013-02-02 VITALS — BP 128/82 | HR 99 | Ht 63.5 in | Wt 201.0 lb

## 2013-02-02 DIAGNOSIS — R5383 Other fatigue: Secondary | ICD-10-CM

## 2013-02-02 DIAGNOSIS — I10 Essential (primary) hypertension: Secondary | ICD-10-CM

## 2013-02-02 DIAGNOSIS — E871 Hypo-osmolality and hyponatremia: Secondary | ICD-10-CM

## 2013-02-02 DIAGNOSIS — E7800 Pure hypercholesterolemia, unspecified: Secondary | ICD-10-CM | POA: Insufficient documentation

## 2013-02-02 DIAGNOSIS — Z0000 Encounter for general adult medical examination without abnormal findings: Secondary | ICD-10-CM

## 2013-02-02 DIAGNOSIS — R5381 Other malaise: Secondary | ICD-10-CM

## 2013-02-02 LAB — CBC WITH DIFFERENTIAL/PLATELET
Eosinophils Absolute: 0.2 10*3/uL (ref 0.0–0.7)
Eosinophils Relative: 2 % (ref 0–5)
HCT: 41.9 % (ref 36.0–46.0)
Lymphocytes Relative: 35 % (ref 12–46)
Lymphs Abs: 3.1 10*3/uL (ref 0.7–4.0)
MCH: 31.9 pg (ref 26.0–34.0)
MCV: 95.4 fL (ref 78.0–100.0)
Monocytes Absolute: 0.5 10*3/uL (ref 0.1–1.0)
Platelets: 317 10*3/uL (ref 150–400)
RBC: 4.39 MIL/uL (ref 3.87–5.11)
RDW: 13.2 % (ref 11.5–15.5)

## 2013-02-02 LAB — LIPID PANEL
HDL: 81 mg/dL (ref >39)
Total CHOL/HDL Ratio: 3.2 Ratio
VLDL: 15 mg/dL (ref 0–40)

## 2013-02-02 LAB — COMPREHENSIVE METABOLIC PANEL
AST: 14 U/L (ref 0–37)
Albumin: 4.8 g/dL (ref 3.5–5.2)
Alkaline Phosphatase: 76 U/L (ref 39–117)
Potassium: 4.9 mEq/L (ref 3.5–5.3)
Sodium: 132 mEq/L — ABNORMAL LOW (ref 135–145)
Total Bilirubin: 0.5 mg/dL (ref 0.3–1.2)
Total Protein: 7.2 g/dL (ref 6.0–8.3)

## 2013-02-02 LAB — POCT URINALYSIS DIPSTICK
Bilirubin, UA: NEGATIVE
Blood, UA: NEGATIVE
Ketones, UA: NEGATIVE
Protein, UA: NEGATIVE
Spec Grav, UA: 1.02
pH, UA: 5

## 2013-02-02 MED ORDER — LOSARTAN POTASSIUM 100 MG PO TABS: 100.0000 mg | ORAL_TABLET | Freq: Every day | ORAL | Status: DC

## 2013-02-02 NOTE — Patient Instructions (Signed)
HEALTH MAINTENANCE RECOMMENDATIONS:  It is recommended that you get at least 30 minutes of aerobic exercise at least 5 days/week (for weight loss, you may need as much as 60-90 minutes). This can be any activity that gets your heart rate up. This can be divided in 10-15 minute intervals if needed, but try and build up your endurance at least once a week.  Weight bearing exercise is also recommended twice weekly.  Eat a healthy diet with lots of vegetables, fruits and fiber.  "Colorful" foods have a lot of vitamins (ie green vegetables, tomatoes, red peppers, etc).  Limit sweet tea, regular sodas and alcoholic beverages, all of which has a lot of calories and sugar.  Up to 1 alcoholic drink daily may be beneficial for women (unless trying to lose weight, watch sugars).  Drink a lot of water.  Calcium recommendations are 1200-1500 mg daily (1500 mg for postmenopausal women or women without ovaries), and vitamin D 1000 IU daily.  This should be obtained from diet and/or supplements (vitamins), and calcium should not be taken all at once, but in divided doses.  Monthly self breast exams and yearly mammograms for women over the age of 1 is recommended.  Sunscreen of at least SPF 30 should be used on all sun-exposed parts of the skin when outside between the hours of 10 am and 4 pm (not just when at beach or pool, but even with exercise, golf, tennis, and yard work!)  Use a sunscreen that says "broad spectrum" so it covers both UVA and UVB rays, and make sure to reapply every 1-2 hours.  Remember to change the batteries in your smoke detectors when changing your clock times in the spring and fall.  Use your seat belt every time you are in a car, and please drive safely and not be distracted with cell phones and texting while driving.  Pneumovax and zostavax are recommended.  Feel free to discuss these vaccines with your allergist

## 2013-02-02 NOTE — Progress Notes (Signed)
Chief Complaint  Patient presents with  . Annual Exam    pt had vision checked 12/2012   Monique James is a 72 y.o. female who presents for a complete physical.  She has the following concerns:  Hypertension follow-up:  Blood pressures elsewhere are 130-140/67-80.  Denies dizziness, headaches, chest pain, edema.  Denies side effects of medications. She has no other complaints or concerns.  Sometimes feels like her neck looks swollen (anteriorly, on sides, above clavicle)  Health Maintenance: There is no immunization history on file for this patient. Allergic to tetanus, flu shots.  She refuses pneumovax and zostavax Last Pap smear: n/a, s/p hysterectomy Last mammogram: 07/2012 Last colonoscopy: 04/2012 Last DEXA: 03/2010 Ophtho: every 6 months, last in February Dentist: twice yearly Exercise: aerobics 3x/week plus walking  Past Medical History  Diagnosis Date  . Hypertension   . Macular degeneration of right eye     Dr. Denman George (mild)  . Colon polyps     Dr. Randa Evens  . Osteopenia     mild  . Hyperlipidemia     elevated trigs  . Hx of pelvic mass     complex, benign pathology  . Hemorrhoids   . History of IBS   . Constipation   . Colon polyps     path was small leiomyoma  . Gout     history of (1985ish)  . Hyponatremia     mild  . 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 Beaulah Dinning)    Past Surgical History  Procedure Laterality Date  . Bilateral salpingoophorectomy  2005    benign tumor/cyst (Dr. Loree Fee)  . Abdominal hysterectomy  1970    for perforated uterus from IUD    History   Social History  . Marital Status: Widowed    Spouse Name: N/A    Number of Children: 3  . Years of Education: N/A   Occupational History  . retired Charity fundraiser    Social History Main Topics  . Smoking status: Never Smoker   . Smokeless tobacco: Never Used  . Alcohol Use: Yes     Comment: 1-2 glass of wine daily.  . Drug Use: No  . Sexually Active: Not Currently   Other  Topics Concern  . Not on file   Social History Narrative   Lives alone, 2 dogs    Family History  Problem Relation Age of Onset  . Hypertension Mother   . Heart disease Mother     MI  . Dementia Mother   . Cancer Father     stomach  . Cancer Brother     multiple myeloma  . Diabetes Neg Hx   . Breast cancer Neg Hx   . Colon cancer Neg Hx   . Healthy Daughter   . Healthy Daughter   . Healthy Daughter     Current outpatient prescriptions:aspirin 81 MG tablet, Take 81 mg by mouth daily.  , Disp: , Rfl: ;  Calcium Carbonate-Vitamin D (CALCIUM 600+D) 600-400 MG-UNIT per tablet, Take 2 tablets by mouth daily.  , Disp: , Rfl: ;  losartan (COZAAR) 100 MG tablet, Take 1 tablet (100 mg total) by mouth daily., Disp: 90 tablet, Rfl: 3;  Multiple Vitamins-Minerals (HAIR/SKIN/NAILS PO), Take by mouth., Disp: , Rfl:  Multiple Vitamins-Minerals (MULTIVITAMIN WITH MINERALS) tablet, Take 1 tablet by mouth daily.  , Disp: , Rfl: ;  multivitamin-lutein (OCUVITE-LUTEIN) CAPS, Take 1 capsule by mouth daily., Disp: , Rfl:   Allergies  Allergen  Reactions  . Bee Venom Anaphylaxis  . Epinephrine Other (See Comments)    High pulse rate.  . Sulfa Antibiotics Nausea And Vomiting  . Tetanus Toxoids Swelling    Entire arm swelled  . Vancomycin Nausea And Vomiting  . Adhesive (Tape) Rash    Her skin peels off.  . Influenza Vaccine Live Swelling and Rash  . Ivp Dye (Iodinated Diagnostic Agents) Rash  . Zyloprim (Allopurinol) Rash   ROS: The patient denies anorexia, weight changes, fever, headaches, vision changes, decreased hearing, ear pain, sore throat, breast concerns, chest pain, palpitations, dizziness, syncope, dyspnea on exertion, cough, swelling, nausea, vomiting, diarrhea, constipation, abdominal pain, melena, hematochezia, indigestion/heartburn, hematuria, incontinence, dysuria, vaginal bleeding, discharge, odor or itch, genital lesions, joint pains, numbness, tingling, weakness, tremor,  suspicious skin lesions, depression, anxiety, abnormal bleeding/bruising, or enlarged lymph nodes.  +arthritis in hands and R knee, no significant pain, but notices with weather changes  PHYSICAL EXAM: BP 128/82  Pulse 99  Ht 5' 3.5" (1.613 m)  Wt 201 lb (91.173 kg)  BMI 35.04 kg/m2  SpO2 97%  163/93 pt's machine L arm 162.86 by MD on RA  General Appearance:  Alert, cooperative, no distress, appears stated age   Head:  Normocephalic, without obvious abnormality, atraumatic   Eyes:  PERRL, conjunctiva/corneas clear, EOM's intact, fundi  benign   Ears:  Normal TM's and external ear canals   Nose:  Nares normal, mucosa normal, no drainage or sinus tenderness   Throat:  Lips, mucosa, and tongue normal; teeth and gums normal   Neck:  Supple, no lymphadenopathy; thyroid: no enlargement/tenderness/nodules; no carotid  bruit or JVD   Back:  Spine nontender, no curvature, ROM normal, no CVA tenderness   Lungs:  Clear to auscultation bilaterally without wheezes, rales or ronchi; respirations unlabored   Chest Wall:  No tenderness or deformity   Heart:  Regular rate and rhythm, S1 and S2 normal, no murmur, rub  or gallop   Breast Exam:  No tenderness, masses, or nipple discharge or inversion. No axillary lymphadenopathy   Abdomen:  Soft, non-tender, nondistended, normoactive bowel sounds,  no masses, no hepatosplenomegaly   Genitalia:  Normal external genitalia without lesions. BUS and vagina normal; No abnormal vaginal discharge. Bimanual exam was normal, without any masses appreciable, nontender  Rectal:  Normal tone, no masses or tenderness; guaiac negative stool   Extremities:  No clubbing, cyanosis or edema   Pulses:  2+ and symmetric all extremities   Skin:  Skin color, texture, turgor normal, no rashes or lesions   Lymph nodes:  Cervical, supraclavicular, and axillary nodes normal   Neurologic:  CNII-XII intact, normal strength, sensation and gait; reflexes 2+ and symmetric  throughout          Psych: Normal mood, affect, hygiene and grooming.   ASSESSMENT/PLAN:  Routine general medical examination at a health care facility - Plan: Lipid panel, Comprehensive metabolic panel, TSH, CBC with Differential  Essential hypertension, benign - Plan: POCT urinalysis dipstick, Lipid panel, Comprehensive metabolic panel, losartan (COZAAR) 100 MG tablet  Hyponatremia - Plan: Comprehensive metabolic panel  Other malaise and fatigue - Plan: TSH, CBC with Differential  HTN--adequately controlled.  Component of white coat HTN; her machine is accurate. Continue current meds.  Discussed monthly self breast exams and yearly mammograms after the age of 42; at least 30 minutes of aerobic activity at least 5 days/week; proper sunscreen use reviewed; healthy diet, including goals of calcium and vitamin D intake and alcohol recommendations (less  than or equal to 1 drink/day) reviewed; regular seatbelt use; changing batteries in smoke detectors. Immunization recommendations discussed--refuses zostavax and pneumovax. Colonoscopy recommendations reviewed, UTD.  Consider DEXA next year (only very mild osteopenia, with T-1.2 in 2011)  MOST form filled out.  Info given to discuss with daughters--Living Will and healthcare power of attorney

## 2013-02-02 NOTE — Progress Notes (Signed)
PATIENT IS HERE FOR PHYSICAL TODAY HER LAST EKG:2005 [place] COLON:04/13/12 DR.EDWARDS NORMAL  T-DAP: ALLERGY MAMMO:07/13/12 NORMAL ZOX:WRUEA 1970 VISION FEB. 2014 DEXA:03-13-10

## 2013-02-03 NOTE — Progress Notes (Signed)
This encounter was created in error - please disregard.

## 2013-02-06 ENCOUNTER — Other Ambulatory Visit: Payer: Self-pay | Admitting: *Deleted

## 2013-02-06 DIAGNOSIS — E7800 Pure hypercholesterolemia, unspecified: Secondary | ICD-10-CM

## 2013-03-30 ENCOUNTER — Other Ambulatory Visit: Payer: Self-pay | Admitting: Family Medicine

## 2013-06-21 ENCOUNTER — Encounter: Payer: Self-pay | Admitting: Family Medicine

## 2013-06-21 ENCOUNTER — Ambulatory Visit (INDEPENDENT_AMBULATORY_CARE_PROVIDER_SITE_OTHER): Payer: Medicare Other | Admitting: Family Medicine

## 2013-06-21 VITALS — BP 138/98 | HR 80 | Ht 64.0 in | Wt 202.0 lb

## 2013-06-21 DIAGNOSIS — N63 Unspecified lump in unspecified breast: Secondary | ICD-10-CM

## 2013-06-21 DIAGNOSIS — D1800 Hemangioma unspecified site: Secondary | ICD-10-CM

## 2013-06-21 DIAGNOSIS — N6320 Unspecified lump in the left breast, unspecified quadrant: Secondary | ICD-10-CM

## 2013-06-21 NOTE — Patient Instructions (Signed)
Left breast lumps--seem like prominent papilla, possibly with some blockage rather than true mass.  Recommended warm compresses (10-15 minutes 3x/day or more) and close observation over the next week.  If not resolving, can schedule for diagnostic mammo (?at [facility]?) sooner than routine visit next month. Call us with an update next week.  If developing redness, swelling, pain, please call (for course of antibiotics).  Atypical appearing angioma on left breast--concerning based on atypical shape and larger size, recent appearance.  Need to monitor for stability versus change.  If any change in shape/size/color, will need biopsy.  I recommend taking a picture so you will more easily be able to detect any change.  I will recheck it myself when you come in October

## 2013-06-21 NOTE — Progress Notes (Signed)
Chief Complaint  Patient presents with  . Breast Mass    on Monday night in the shower she found a hard spot on her left nipple that has never been there before, wanted to have evaluated.    Patient noticed a hard lump under her left nipple 2 days ago.  She isn't due for a mammogram until September, so presents for evaluation today.  Denies any nipple inversion or nipple discharge. No significant pain.  Past Medical History  Diagnosis Date  . Hypertension   . Macular degeneration of right eye     Dr. Denman George (mild)  . Colon polyps     Dr. Randa Evens  . Osteopenia     mild  . Hyperlipidemia     elevated trigs  . Hx of pelvic mass     complex, benign pathology  . Hemorrhoids   . History of IBS   . Constipation   . Colon polyps     path was small leiomyoma  . Gout     history of (1985ish)  . Hyponatremia     mild  . 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 Beaulah Dinning)   Past Surgical History  Procedure Laterality Date  . Bilateral salpingoophorectomy  2005    benign tumor/cyst (Dr. Loree Fee)  . Abdominal hysterectomy  1970    for perforated uterus from IUD   Family History  Problem Relation Age of Onset  . Hypertension Mother   . Heart disease Mother     MI  . Dementia Mother   . Cancer Father     stomach  . Cancer Brother     multiple myeloma  . Diabetes Neg Hx   . Breast cancer Neg Hx   . Colon cancer Neg Hx   . Healthy Daughter   . Healthy Daughter   . Healthy Daughter     History   Social History  . Marital Status: Widowed    Spouse Name: N/A    Number of Children: 3  . Years of Education: N/A   Occupational History  . retired Charity fundraiser    Social History Main Topics  . Smoking status: Never Smoker   . Smokeless tobacco: Never Used  . Alcohol Use: Yes     Comment: 1-2 glass of wine daily.  . Drug Use: No  . Sexual Activity: Not Currently   Other Topics Concern  . Not on file   Social History Narrative   Lives alone, 2 dogs   Current  Outpatient Prescriptions on File Prior to Visit  Medication Sig Dispense Refill  . aspirin 81 MG tablet Take 81 mg by mouth daily.        . Calcium Carbonate-Vitamin D (CALCIUM 600+D) 600-400 MG-UNIT per tablet Take 1 tablet by mouth daily.       [name] Kitchen losartan (COZAAR) 100 MG tablet Take 1 tablet (100 mg total) by mouth daily.  90 tablet  3  . Multiple Vitamins-Minerals (HAIR/SKIN/NAILS PO) Take by mouth.      . Multiple Vitamins-Minerals (MULTIVITAMIN WITH MINERALS) tablet Take 1 tablet by mouth daily.        . multivitamin-lutein (OCUVITE-LUTEIN) CAPS Take 1 capsule by mouth daily.       No current facility-administered medications on file prior to visit.   Allergies  Allergen Reactions  . Bee Venom Anaphylaxis  . Epinephrine Other (See Comments)    High pulse rate.  . Sulfa Antibiotics Nausea And Vomiting  . Tetanus Toxoids Swelling  Entire arm swelled  . Vancomycin Nausea And Vomiting  . Adhesive [Tape] Rash    Her skin peels off.  . Influenza Vaccine Live Swelling and Rash  . Ivp Dye [Iodinated Diagnostic Agents] Rash  . Zyloprim [Allopurinol] Rash   ROS:  Denies fevers, URI symptoms, headaches, chest pain, skin rashes, bleeding/bruising, depression or other concerns.  See HPI  PHYSICAL EXAM: BP 138/98  Pulse 80  Ht 5\' 4"  (1.626 m)  Wt 202 lb (91.627 kg)  BMI 34.66 kg/m2  Well developed, mildly anxious female, in no distress  Left breast: there are two prominent nodules/papilla below left nipple, and one on the right side (9 o'clock) of the nipple itself that is smaller/not as firm.  There are some minimally prominent papilla at areola border at 2-4 o'clock, that are not firm.  There is no nipple discharge, no other masses noted.  Skin appears normal.  Breasts are not symmetric--right droops a little more (so when seated, the nipple points more downward on right than the left, which points more straight out).  No xillary lymphadenopathy. There is a skin lesion on her left  breast, on left side that she reports appeared just in the last couple of months.  She has many angiomas throughout her skin.  This one, however, is larger than the others.  It measures 6 x 4 mm, is almost triangular in shape, with a small dot at the bottom of the triangle.  The edges seem flat, but the central portion is mildly raised (and rounder, like a typical angioma).  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  Recommended warm compresses and close observation over the next week.  If not resolving, can schedule for diagnostic mammo (?at [facility]?) sooner than her routine visit next month.  Atypical appearing angioma on left breast--concerning based on atypical shape and large size, recent appearance.  Need to monitor for stability versus change.  If any change in shape/size/color, will need biopsy.   Schedule visit for October to recheck the mole on her breast (when she returns for fasting labs).

## 2013-06-28 ENCOUNTER — Telehealth: Payer: Self-pay | Admitting: Internal Medicine

## 2013-06-28 ENCOUNTER — Other Ambulatory Visit: Payer: Self-pay | Admitting: Family Medicine

## 2013-06-28 NOTE — Telephone Encounter (Signed)
Patient is going to have diagnostic B/L mammo and left breast ultrasound 07/20/13 @ 9:30am. She is to call between now and then if anything changes/worsens and we will get her in sooner.

## 2013-06-28 NOTE — Telephone Encounter (Signed)
Pt called stating that she still has a hard spot in her breast area. She has used hot compresses as instructed and its still there. She wants to know what else to do?

## 2013-06-28 NOTE — Telephone Encounter (Signed)
Please ask if the lumps are at all tender, or red/pink. If so, then can try treating with ABX for a week. If not at all, and completely unchanged from last visit then schedule for diagnostic mammo and u/s (she prefers at [facility], if possible)

## 2013-07-20 ENCOUNTER — Ambulatory Visit
Admission: RE | Admit: 2013-07-20 | Discharge: 2013-07-20 | Disposition: A | Discharge status: Home / Self Care | Payer: Medicare Other | Source: Ambulatory Visit | Attending: Family Medicine | Admitting: Family Medicine

## 2013-08-09 ENCOUNTER — Ambulatory Visit (INDEPENDENT_AMBULATORY_CARE_PROVIDER_SITE_OTHER): Payer: Medicare Other | Admitting: Family Medicine

## 2013-08-09 ENCOUNTER — Encounter: Payer: Self-pay | Admitting: Family Medicine

## 2013-08-09 VITALS — BP 144/80 | HR 80 | Ht 64.0 in | Wt 202.0 lb

## 2013-08-09 DIAGNOSIS — D1800 Hemangioma unspecified site: Secondary | ICD-10-CM

## 2013-08-09 DIAGNOSIS — I10 Essential (primary) hypertension: Secondary | ICD-10-CM

## 2013-08-09 DIAGNOSIS — E7800 Pure hypercholesterolemia, unspecified: Secondary | ICD-10-CM

## 2013-08-09 LAB — LIPID PANEL
HDL: 81 mg/dL (ref >39)
LDL Cholesterol: 130 mg/dL — ABNORMAL HIGH (ref 0–99)
Total CHOL/HDL Ratio: 2.9 Ratio
VLDL: 23 mg/dL (ref 0–40)

## 2013-08-09 NOTE — Patient Instructions (Signed)
We will notify you of your cholesterol results in a few days.  Continue low cholesterol diet, trying to avoid creamy dressings, sauces, soups.  Limit cheese intake, use ground [place] meat rather than ground beef.  Continue to check your blood pressures at home (since they are always a little higher here). Continue efforts at weight loss (diet, exercise)

## 2013-08-09 NOTE — Progress Notes (Signed)
Chief Complaint  Patient presents with  . Follow-up    recheck on left breast mole, patient states it is about the same, no change. Also some fasting labs. Refused flu vaccine-is allergic.    She returns for recheck of mole/angioma noted at last visit on the left breast.  She does not believe there has been any change in the shape, size or color.  The lumps at the left nipple have almost completely resolved--small hard spot remains (the others all resolved).  Ultrasound and mammogram were normal.  Hyperlipidemia:  She has reduced the cheese and processed foods in her diet, eating more fruits and vegetable.  She cut out the creamy dressings.  She mostly eats chicken and some pork, only has a burger on the grill once a month.   Has tuna without mayonnaise. Doesn't eat much eggs.  Her biggest "culprit" in her diet is cheese, and sometimes the creamy dressings.  She is fasting for repeat lipid panel today.  HTN: Hasn't check BP's recently at home, but usually in the low 130's/70's-80.  Denies headaches, dizziness  Past Medical History  Diagnosis Date  . Hypertension   . Macular degeneration of right eye     Dr. Denman George (mild)  . Colon polyps     Dr. Randa Evens  . Osteopenia     mild  . Hyperlipidemia     elevated trigs  . Hx of pelvic mass     complex, benign pathology  . Hemorrhoids   . History of IBS   . Constipation   . Colon polyps     path was small leiomyoma  . Gout     history of (1985ish)  . Hyponatremia     mild  . 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 Beaulah Dinning)   Past Surgical History  Procedure Laterality Date  . Bilateral salpingoophorectomy  2005    benign tumor/cyst (Dr. Loree Fee)  . Abdominal hysterectomy  1970    for perforated uterus from IUD   History   Social History  . Marital Status: Widowed    Spouse Name: N/A    Number of Children: 3  . Years of Education: N/A   Occupational History  . retired Charity fundraiser    Social History Main Topics  . Smoking  status: Never Smoker   . Smokeless tobacco: Never Used  . Alcohol Use: Yes     Comment: 1-2 glass of wine daily.  . Drug Use: No  . Sexual Activity: Not Currently   Other Topics Concern  . Not on file   Social History Narrative   Lives alone, 2 dogs   Current outpatient prescriptions:aspirin 81 MG tablet, Take 81 mg by mouth daily.  , Disp: , Rfl: ;  Calcium Carbonate-Vitamin D (CALCIUM 600+D) 600-400 MG-UNIT per tablet, Take 1 tablet by mouth daily. , Disp: , Rfl: ;  losartan (COZAAR) 100 MG tablet, Take 1 tablet (100 mg total) by mouth daily., Disp: 90 tablet, Rfl: 3;  Multiple Vitamins-Minerals (HAIR/SKIN/NAILS PO), Take by mouth., Disp: , Rfl:  Multiple Vitamins-Minerals (MULTIVITAMIN WITH MINERALS) tablet, Take 1 tablet by mouth daily.  , Disp: , Rfl: ;  multivitamin-lutein (OCUVITE-LUTEIN) CAPS, Take 1 capsule by mouth daily., Disp: , Rfl:   Allergies  Allergen Reactions  . Bee Venom Anaphylaxis  . Epinephrine Other (See Comments)    High pulse rate.  . Sulfa Antibiotics Nausea And Vomiting  . Tetanus Toxoids Swelling    Entire arm swelled  . Vancomycin  Nausea And Vomiting  . Adhesive [Tape] Rash    Her skin peels off.  . Influenza Vaccine Live Swelling and Rash  . Ivp Dye [Iodinated Diagnostic Agents] Rash  . Zyloprim [Allopurinol] Rash   ROS:  Denies fevers, chills, URI symptoms, headaches, dizziness, chest pain, cough, shortness of breath, edema skin lesions/rashes (other than those in HPI); no bleeding/bruising, depression or other concerns.  PHYSICAL EXAM: BP 144/80  Pulse 80  Ht 5\' 4"  (1.626 m)  Wt 202 lb (91.627 kg)  BMI 34.66 kg/m2 Pleasant obese female in no distress Breast: Nipple:  At 6 o'clock there is a slightly firm nodule (with a softer nodule to the left of it), very superficial.  Other nodules are smaller and soft. Skin: Angioma left breast  is almost triangular in shape, with a small dot at the bottom of the triangle, like a trunk on a Christmas tree.   It measures 6 x 4.5 mm (to a little over 6 including the bottom "trunk" of tree) mm, The edges seem flat, but the central portion is mildly raised (and rounder, like a typical angioma).  No significant change from prior exam.  ASSESSMENT/PLAN:  Pure hypercholesterolemia - diet reviewed.  Hoping that LDL will be closer to 130 (goal is <130).  briefly discussed meds vs diet, prefers diet.  Cheese is main culprit, she really enjoys. - Plan: Lipid Panel  Essential hypertension, benign  Angioma  Angioma--atypical shape, but unchanged in size, color.  Continue to monitor Nodule at left nipple is consistent with a cyst/plugged gland, significantly improved.  Continue warm compresses prn

## 2013-08-10 ENCOUNTER — Other Ambulatory Visit: Payer: Self-pay

## 2013-08-10 ENCOUNTER — Encounter: Payer: Self-pay | Admitting: Family Medicine

## 2014-02-08 ENCOUNTER — Ambulatory Visit (INDEPENDENT_AMBULATORY_CARE_PROVIDER_SITE_OTHER): Payer: Medicare Other | Admitting: Family Medicine

## 2014-02-08 ENCOUNTER — Encounter: Payer: Self-pay | Admitting: Family Medicine

## 2014-02-08 VITALS — BP 120/72 | HR 84 | Ht 63.25 in | Wt 204.0 lb

## 2014-02-08 DIAGNOSIS — M8580 Other specified disorders of bone density and structure, unspecified site: Secondary | ICD-10-CM

## 2014-02-08 DIAGNOSIS — I10 Essential (primary) hypertension: Secondary | ICD-10-CM

## 2014-02-08 DIAGNOSIS — E669 Obesity, unspecified: Secondary | ICD-10-CM | POA: Insufficient documentation

## 2014-02-08 DIAGNOSIS — E7800 Pure hypercholesterolemia, unspecified: Secondary | ICD-10-CM

## 2014-02-08 DIAGNOSIS — M949 Disorder of cartilage, unspecified: Secondary | ICD-10-CM

## 2014-02-08 DIAGNOSIS — Z0000 Encounter for general adult medical examination without abnormal findings: Secondary | ICD-10-CM

## 2014-02-08 DIAGNOSIS — E871 Hypo-osmolality and hyponatremia: Secondary | ICD-10-CM

## 2014-02-08 DIAGNOSIS — M899 Disorder of bone, unspecified: Secondary | ICD-10-CM

## 2014-02-08 LAB — COMPREHENSIVE METABOLIC PANEL
ALT: 12 U/L (ref 0–35)
AST: 14 U/L (ref 0–37)
Albumin: 4.1 g/dL (ref 3.5–5.2)
Alkaline Phosphatase: 58 U/L (ref 39–117)
BILIRUBIN TOTAL: 0.5 mg/dL (ref 0.2–1.2)
BUN: 17 mg/dL (ref 6–23)
CO2: 24 mEq/L (ref 19–32)
CREATININE: 0.83 mg/dL (ref 0.50–1.10)
Calcium: 9.2 mg/dL (ref 8.4–10.5)
Chloride: 100 mEq/L (ref 96–112)
Glucose, Bld: 98 mg/dL (ref 70–99)
Potassium: 4.3 mEq/L (ref 3.5–5.3)
Sodium: 133 mEq/L — ABNORMAL LOW (ref 135–145)
Total Protein: 6.8 g/dL (ref 6.0–8.3)

## 2014-02-08 LAB — POCT URINALYSIS DIPSTICK
BILIRUBIN UA: NEGATIVE
GLUCOSE UA: NEGATIVE
Ketones, UA: NEGATIVE
Leukocytes, UA: NEGATIVE
Nitrite, UA: NEGATIVE
Protein, UA: NEGATIVE
SPEC GRAV UA: 1.01
Urobilinogen, UA: NEGATIVE
pH, UA: 5

## 2014-02-08 LAB — LIPID PANEL
Cholesterol: 223 mg/dL — ABNORMAL HIGH (ref 0–200)
HDL: 81 mg/dL (ref >39)
LDL CALC: 123 mg/dL — AB (ref 0–99)
TRIGLYCERIDES: 96 mg/dL (ref <150)
Total CHOL/HDL Ratio: 2.8 Ratio
VLDL: 19 mg/dL (ref 0–40)

## 2014-02-08 MED ORDER — LOSARTAN POTASSIUM 100 MG PO TABS: 100.0000 mg | ORAL_TABLET | Freq: Every day | ORAL | Status: DC

## 2014-02-08 NOTE — Patient Instructions (Signed)
  HEALTH MAINTENANCE RECOMMENDATIONS:  It is recommended that you get at least 30 minutes of aerobic exercise at least 5 days/week (for weight loss, you may need as much as 60-90 minutes). This can be any activity that gets your heart rate up. This can be divided in 10-15 minute intervals if needed, but try and build up your endurance at least once a week.  Weight bearing exercise is also recommended twice weekly.  Eat a healthy diet with lots of vegetables, fruits and fiber.  "Colorful" foods have a lot of vitamins (ie green vegetables, tomatoes, red peppers, etc).  Limit sweet tea, regular sodas and alcoholic beverages, all of which has a lot of calories and sugar.  Up to 1 alcoholic drink daily may be beneficial for women (unless trying to lose weight, watch sugars).  Drink a lot of water.  Calcium recommendations are 1200-1500 mg daily (1500 mg for postmenopausal women or women without ovaries), and vitamin D 1000 IU daily.  This should be obtained from diet and/or supplements (vitamins), and calcium should not be taken all at once, but in divided doses.  Monthly self breast exams and yearly mammograms for women over the age of 49 is recommended.  Sunscreen of at least SPF 30 should be used on all sun-exposed parts of the skin when outside between the hours of 10 am and 4 pm (not just when at beach or pool, but even with exercise, golf, tennis, and yard work!)  Use a sunscreen that says "broad spectrum" so it covers both UVA and UVB rays, and make sure to reapply every 1-2 hours.  Remember to change the batteries in your smoke detectors when changing your clock times in the spring and fall.  Use your seat belt every time you are in a car, and please drive safely and not be distracted with cell phones and texting while driving.  We are ordering a bone density to be done at [facility].  I do not know if they will call you to schedule, or if you need to call them.  Call them if you haven't heard  from them within a week.

## 2014-02-08 NOTE — Progress Notes (Signed)
Chief Complaint  Patient presents with  . Annual Exam    fasting annual exam with pelvic exam. Did not do eye exam jusy ad one with Dr.Cotter a month or two ago. UA showed trace blood-no symptoms. No concerns.    Monique James is a 73 y.o. female who presents for a complete physical.  She has the following concerns:  Hypertension follow-up:  Blood pressures elsewhere are 120's-135/70's.  Denies dizziness, headaches, chest pain.  Denies side effects of medications.  Hyperlipidemia: She has continued to limit her cheese intake, continuing to eat more fruits and vegetables. She cut out the creamy dressings. She mostly eats chicken and some pork, only has a burger on the grill once a month, if even that often. Has tuna without mayonnaise (uses vinegar).  Her last lipid panel 6 months ago was improved, with LDL back down to 130.  AWV: Other doctors caring for patient include: Dentist: Dr. Truman Hayward Ophtho: Dr. Jorja Loa Allergist:  Dr. Shaune Leeks GI: Dr. Oletta Lamas  Depression screen:  Negative.  See scanned questionnaire ADL screen:  Negative HANDS questionnaire--see scanned questionnaire End of Life discussion:  She has filled out Living Will and healthcare power of attorney--hasn't gotten it notarized yet.  There is no immunization history on file for this patient. Allergic to tetanus, flu shots. She refuses pneumovax and zostavax  Last Pap smear: n/a, s/p hysterectomy  Last mammogram: 07/2013 Last colonoscopy: 04/2012  Last DEXA: 03/2010 at [facility] (appears to have been scanned in 02/2012, but actual date of exam was 03/2010) Ophtho: every 6 months, last in February; now told yearly Dentist: twice yearly  Exercise: aerobics 3x/week ,some with weights, plus walking H/o low vitamin D in the past.  Last checked in 2012 and was normal.  She remains on same supplementation  Past Medical History  Diagnosis Date  . Hypertension   . Macular degeneration of right eye     Dr. Mikey Bussing (mild)--MISDIAGNOSED;  12/2013 told scarring, NOT macular degeneration  . Colon polyps     Dr. Oletta Lamas  . Osteopenia     mild  . Hyperlipidemia     elevated trigs and LDL  . Hx of pelvic mass     complex, benign pathology  . Hemorrhoids   . History of IBS   . Constipation   . Colon polyps     path was small leiomyoma  . Gout     history of (1985ish)  . Hyponatremia     mild  . 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 Shaune Leeks)    Past Surgical History  Procedure Laterality Date  . Bilateral salpingoophorectomy  2005    benign tumor/cyst (Dr. Aldean Ast)  . Abdominal hysterectomy  1970    for perforated uterus from IUD    History   Social History  . Marital Status: Widowed    Spouse Name: N/A    Number of Children: 3  . Years of Education: N/A   Occupational History  . retired Therapist, sports    Social History Main Topics  . Smoking status: Never Smoker   . Smokeless tobacco: Never Used  . Alcohol Use: Yes     Comment: 1-2 glass of wine daily.  . Drug Use: No  . Sexual Activity: Not Currently   Other Topics Concern  . Not on file   Social History Narrative   Lives alone, 2 dogs;  Daughter lives nearby.  3 grandchildren.  Other children in [place]  Family History  Problem Relation Age of Onset  . Hypertension Mother   . Heart disease Mother     MI  . Dementia Mother   . Cancer Father     stomach  . Cancer Brother     multiple myeloma  . Diabetes Neg Hx   . Breast cancer Neg Hx   . Colon cancer Neg Hx   . Healthy Daughter   . Healthy Daughter   . Healthy Daughter    Outpatient Encounter Prescriptions as of 02/08/2014  Medication Sig  . aspirin 81 MG tablet Take 81 mg by mouth daily.    . Biotin 5000 MCG TABS Take 1 tablet by mouth daily.  . Calcium Carbonate-Vitamin D (CALCIUM 600+D) 600-400 MG-UNIT per tablet Take 1 tablet by mouth daily.   [name] Kitchen FIBER SELECT GUMMIES PO Take 1 each by mouth daily.  [name] Kitchen losartan (COZAAR) 100 MG tablet Take 1 tablet (100 mg  total) by mouth daily.  . Multiple Vitamins-Minerals (MULTIVITAMIN WITH MINERALS) tablet Take 1 tablet by mouth daily.    . [DISCONTINUED] Multiple Vitamins-Minerals (HAIR/SKIN/NAILS PO) Take by mouth.  . [DISCONTINUED] multivitamin-lutein (OCUVITE-LUTEIN) CAPS Take 1 capsule by mouth daily.     Allergies  Allergen Reactions  . Bee Venom Anaphylaxis  . Epinephrine Other (See Comments)    High pulse rate.  . Sulfa Antibiotics Nausea And Vomiting  . Tetanus Toxoids Swelling    Entire arm swelled  . Vancomycin Nausea And Vomiting  . Adhesive [Tape] Rash    Her skin peels off.  . Influenza Vaccine Live Swelling and Rash  . Ivp Dye [Iodinated Diagnostic Agents] Rash  . Zyloprim [Allopurinol] Rash   ROS: The patient denies anorexia, weight changes, fever, headaches, vision changes, decreased hearing, ear pain, sore throat, breast concerns, chest pain, palpitations, dizziness, syncope, dyspnea on exertion, cough, swelling, nausea, vomiting, diarrhea, constipation, abdominal pain, melena, hematochezia, hematuria, incontinence, dysuria, vaginal bleeding, discharge, odor or itch, genital lesions, numbness, tingling, weakness, tremor, suspicious skin lesions, depression, anxiety, abnormal bleeding/bruising, or enlarged lymph nodes.  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 Occasional heartburn with fatty foods  PHYSICAL EXAM:  BP 120/72  Pulse 84  Ht 5' 3.25" (1.607 m)  Wt 204 lb (92.534 kg)  BMI 35.83 kg/m2   General Appearance:  Alert, cooperative, no distress, appears stated age   Head:  Normocephalic, without obvious abnormality, atraumatic   Eyes:  PERRL, conjunctiva/corneas clear, EOM's intact, fundi  benign   Ears:  Normal TM's and external ear canals   Nose:  Nares normal, mucosa normal, no drainage or sinus tenderness   Throat:  Lips, mucosa, and tongue normal; teeth and gums normal   Neck:  Supple, no lymphadenopathy; thyroid: no  enlargement/tenderness/nodules; no carotid  bruit or JVD   Back:  Spine nontender, no curvature, ROM normal, no CVA tenderness   Lungs:  Clear to auscultation bilaterally without wheezes, rales or ronchi; respirations unlabored   Chest Wall:  No tenderness or deformity   Heart:  Regular rate and rhythm, S1 and S2 normal, no murmur, rub  or gallop   Breast Exam:  No tenderness, masses, or nipple discharge or inversion. No axillary lymphadenopathy   Abdomen:  Soft, non-tender, nondistended, normoactive bowel sounds,  no masses, no hepatosplenomegaly   Genitalia:  Normal external genitalia without lesions, mild atrophic changes. BUS and vagina normal; No abnormal vaginal discharge. Bimanual exam was normal, without any masses appreciable, nontender   Rectal:  Normal tone, no masses or tenderness; guaiac negative stool   Extremities:  No clubbing, cyanosis or edema   Pulses:  2+ and symmetric all extremities   Skin:  Skin color, texture, turgor normal, no rashes or lesions; Angioma left breast is almost triangular in shape, with a small dot at the bottom of the triangle, like a trunk on a Christmas tree. It measures 6 x 4.5 mm (to a little over 6 including the bottom "trunk" of tree) mm, The edges seem flat, but the central portion is mildly raised (and rounder, like a typical angioma). No significant change from prior exam.  Lymph nodes:  Cervical, supraclavicular, and axillary nodes normal   Neurologic:  CNII-XII intact, normal strength, sensation and gait; reflexes 2+ and symmetric throughout          Psych: Normal mood, affect, hygiene and grooming.    ASSESSMENT/PLAN:  Routine general medical examination at a health care facility - Plan: POCT Urinalysis Dipstick, TSH  Essential hypertension, benign - controlled - Plan: Comprehensive metabolic panel, losartan (COZAAR) 100 MG tablet  Hyponatremia - chronic, mild.  due for monitoring - Plan: Comprehensive metabolic panel, TSH  Pure  hypercholesterolemia - continue low cholesterol diet - Plan: Lipid panel  Osteopenia - Plan: DG Bone Density  Obesity (BMI 30-39.9) - her weight has been stable with her current exercise routine and diet.  Discussed ways to cut back portions, wine, increase exercise; reviewed goals, risks   Discussed monthly self breast exams and yearly mammograms after the age of 51; at least 30 minutes of aerobic activity at least 5 days/week; proper sunscreen use reviewed; healthy diet, including goals of calcium and vitamin D intake and alcohol recommendations (less than or equal to 1 drink/day) reviewed; regular seatbelt use; changing batteries in smoke detectors. Immunization recommendations discussed--refuses zostavax and pneumovax (due to allergies to other vaccines). Colonoscopy recommendations reviewed, UTD. DEXA ordered--to be done at [facility] form reviewed and updated.  Reminded to get forms notarized, and will drop off copy for her chart here.   F/u in 1 year, sooner if labs warrant sooner visit, or if any other concerns

## 2014-02-09 ENCOUNTER — Encounter: Payer: Self-pay | Admitting: Family Medicine

## 2014-02-09 LAB — TSH: TSH: 2.043 u[IU]/mL (ref 0.350–4.500)

## 2014-02-27 ENCOUNTER — Ambulatory Visit (HOSPITAL_COMMUNITY)
Admission: RE | Admit: 2014-02-27 | Discharge: 2014-02-27 | Disposition: A | Discharge status: Home / Self Care | Payer: Medicare Other | Source: Ambulatory Visit | Attending: Family Medicine | Admitting: Family Medicine

## 2014-02-27 DIAGNOSIS — M949 Disorder of cartilage, unspecified: Principal | ICD-10-CM

## 2014-02-27 DIAGNOSIS — M899 Disorder of bone, unspecified: Secondary | ICD-10-CM | POA: Insufficient documentation

## 2014-02-27 DIAGNOSIS — M8580 Other specified disorders of bone density and structure, unspecified site: Secondary | ICD-10-CM

## 2014-07-25 ENCOUNTER — Other Ambulatory Visit: Payer: Self-pay | Admitting: Family Medicine

## 2014-07-25 DIAGNOSIS — Z1231 Encounter for screening mammogram for malignant neoplasm of breast: Secondary | ICD-10-CM

## 2014-08-01 ENCOUNTER — Ambulatory Visit (HOSPITAL_COMMUNITY): Payer: Medicare Other

## 2014-08-01 ENCOUNTER — Ambulatory Visit (HOSPITAL_COMMUNITY)
Admission: RE | Admit: 2014-08-01 | Discharge: 2014-08-01 | Disposition: A | Discharge status: Home / Self Care | Payer: Medicare Other | Source: Ambulatory Visit | Attending: Family Medicine | Admitting: Family Medicine

## 2014-08-01 DIAGNOSIS — Z1231 Encounter for screening mammogram for malignant neoplasm of breast: Secondary | ICD-10-CM | POA: Diagnosis not present

## 2014-09-03 ENCOUNTER — Encounter: Payer: Self-pay | Admitting: Family Medicine

## 2014-11-05 ENCOUNTER — Encounter (HOSPITAL_COMMUNITY): Payer: Self-pay | Admitting: Emergency Medicine

## 2014-11-05 ENCOUNTER — Inpatient Hospital Stay (HOSPITAL_COMMUNITY)
Admission: EM | Admit: 2014-11-05 | Discharge: 2014-11-07 | DRG: 309 | Disposition: A | Discharge status: Home / Self Care | Payer: Medicare Other | Attending: Internal Medicine | Admitting: Internal Medicine

## 2014-11-05 ENCOUNTER — Emergency Department (HOSPITAL_COMMUNITY): Payer: Medicare Other

## 2014-11-05 DIAGNOSIS — M109 Gout, unspecified: Secondary | ICD-10-CM | POA: Diagnosis not present

## 2014-11-05 DIAGNOSIS — R000 Tachycardia, unspecified: Secondary | ICD-10-CM | POA: Diagnosis not present

## 2014-11-05 DIAGNOSIS — Z7982 Long term (current) use of aspirin: Secondary | ICD-10-CM

## 2014-11-05 DIAGNOSIS — Z91048 Other nonmedicinal substance allergy status: Secondary | ICD-10-CM

## 2014-11-05 DIAGNOSIS — M8580 Other specified disorders of bone density and structure, unspecified site: Secondary | ICD-10-CM | POA: Diagnosis not present

## 2014-11-05 DIAGNOSIS — E871 Hypo-osmolality and hyponatremia: Secondary | ICD-10-CM | POA: Diagnosis not present

## 2014-11-05 DIAGNOSIS — F419 Anxiety disorder, unspecified: Secondary | ICD-10-CM | POA: Diagnosis not present

## 2014-11-05 DIAGNOSIS — E6609 Other obesity due to excess calories: Secondary | ICD-10-CM | POA: Diagnosis present

## 2014-11-05 DIAGNOSIS — K589 Irritable bowel syndrome without diarrhea: Secondary | ICD-10-CM | POA: Diagnosis not present

## 2014-11-05 DIAGNOSIS — Z6834 Body mass index (BMI) 34.0-34.9, adult: Secondary | ICD-10-CM

## 2014-11-05 DIAGNOSIS — H3530 Unspecified macular degeneration: Secondary | ICD-10-CM | POA: Diagnosis present

## 2014-11-05 DIAGNOSIS — Z91041 Radiographic dye allergy status: Secondary | ICD-10-CM | POA: Diagnosis not present

## 2014-11-05 DIAGNOSIS — I10 Essential (primary) hypertension: Secondary | ICD-10-CM | POA: Diagnosis not present

## 2014-11-05 DIAGNOSIS — Z882 Allergy status to sulfonamides status: Secondary | ICD-10-CM | POA: Diagnosis not present

## 2014-11-05 DIAGNOSIS — R51 Headache: Secondary | ICD-10-CM | POA: Diagnosis not present

## 2014-11-05 DIAGNOSIS — R42 Dizziness and giddiness: Secondary | ICD-10-CM | POA: Diagnosis not present

## 2014-11-05 DIAGNOSIS — I4891 Unspecified atrial fibrillation: Principal | ICD-10-CM | POA: Diagnosis present

## 2014-11-05 DIAGNOSIS — R0602 Shortness of breath: Secondary | ICD-10-CM | POA: Diagnosis not present

## 2014-11-05 DIAGNOSIS — D72829 Elevated white blood cell count, unspecified: Secondary | ICD-10-CM | POA: Diagnosis present

## 2014-11-05 DIAGNOSIS — Z90710 Acquired absence of both cervix and uterus: Secondary | ICD-10-CM | POA: Diagnosis not present

## 2014-11-05 DIAGNOSIS — Z86010 Personal history of colonic polyps: Secondary | ICD-10-CM

## 2014-11-05 DIAGNOSIS — E669 Obesity, unspecified: Secondary | ICD-10-CM | POA: Diagnosis not present

## 2014-11-05 DIAGNOSIS — R079 Chest pain, unspecified: Secondary | ICD-10-CM | POA: Diagnosis not present

## 2014-11-05 DIAGNOSIS — Z91030 Bee allergy status: Secondary | ICD-10-CM

## 2014-11-05 DIAGNOSIS — I498 Other specified cardiac arrhythmias: Secondary | ICD-10-CM | POA: Diagnosis not present

## 2014-11-05 DIAGNOSIS — I517 Cardiomegaly: Secondary | ICD-10-CM | POA: Diagnosis not present

## 2014-11-05 DIAGNOSIS — D649 Anemia, unspecified: Secondary | ICD-10-CM | POA: Diagnosis not present

## 2014-11-05 DIAGNOSIS — E785 Hyperlipidemia, unspecified: Secondary | ICD-10-CM | POA: Diagnosis present

## 2014-11-05 DIAGNOSIS — I369 Nonrheumatic tricuspid valve disorder, unspecified: Secondary | ICD-10-CM | POA: Diagnosis not present

## 2014-11-05 HISTORY — DX: Unspecified atrial fibrillation: I48.91

## 2014-11-05 LAB — CBC WITH DIFFERENTIAL/PLATELET
BASOS ABS: 0 10*3/uL (ref 0.0–0.1)
BASOS PCT: 0 % (ref 0–1)
Eosinophils Absolute: 0.1 10*3/uL (ref 0.0–0.7)
Eosinophils Relative: 1 % (ref 0–5)
HEMATOCRIT: 42.5 % (ref 36.0–46.0)
HEMOGLOBIN: 14.1 g/dL (ref 12.0–15.0)
LYMPHS ABS: 2.6 10*3/uL (ref 0.7–4.0)
LYMPHS PCT: 21 % (ref 12–46)
MCH: 32.1 pg (ref 26.0–34.0)
MCHC: 33.2 g/dL (ref 30.0–36.0)
MCV: 96.8 fL (ref 78.0–100.0)
MONO ABS: 0.8 10*3/uL (ref 0.1–1.0)
MONOS PCT: 6 % (ref 3–12)
Neutro Abs: 8.6 10*3/uL — ABNORMAL HIGH (ref 1.7–7.7)
Neutrophils Relative %: 72 % (ref 43–77)
Platelets: 351 10*3/uL (ref 150–400)
RBC: 4.39 MIL/uL (ref 3.87–5.11)
RDW: 13.2 % (ref 11.5–15.5)
WBC: 12 10*3/uL — AB (ref 4.0–10.5)

## 2014-11-05 LAB — COMPREHENSIVE METABOLIC PANEL
ALT: 18 U/L (ref 0–35)
ANION GAP: 9 (ref 5–15)
AST: 21 U/L (ref 0–37)
Albumin: 4.1 g/dL (ref 3.5–5.2)
Alkaline Phosphatase: 57 U/L (ref 39–117)
BUN: 17 mg/dL (ref 6–23)
CALCIUM: 9.9 mg/dL (ref 8.4–10.5)
CO2: 25 mmol/L (ref 19–32)
CREATININE: 1.05 mg/dL (ref 0.50–1.10)
Chloride: 100 mEq/L (ref 96–112)
GFR, EST AFRICAN AMERICAN: 59 mL/min — AB (ref >90)
GFR, EST NON AFRICAN AMERICAN: 51 mL/min — AB (ref >90)
Glucose, Bld: 118 mg/dL — ABNORMAL HIGH (ref 70–99)
Potassium: 4.2 mmol/L (ref 3.5–5.1)
Sodium: 134 mmol/L — ABNORMAL LOW (ref 135–145)
TOTAL PROTEIN: 7.1 g/dL (ref 6.0–8.3)
Total Bilirubin: 0.4 mg/dL (ref 0.3–1.2)

## 2014-11-05 LAB — URINALYSIS, ROUTINE W REFLEX MICROSCOPIC
BILIRUBIN URINE: NEGATIVE
Glucose, UA: NEGATIVE mg/dL
Ketones, ur: NEGATIVE mg/dL
LEUKOCYTES UA: NEGATIVE
Nitrite: NEGATIVE
PROTEIN: NEGATIVE mg/dL
Specific Gravity, Urine: 1.02 (ref 1.005–1.030)
Urobilinogen, UA: 0.2 mg/dL (ref 0.0–1.0)
pH: 5.5 (ref 5.0–8.0)

## 2014-11-05 LAB — T4, FREE: Free T4: 1.21 ng/dL (ref 0.80–1.80)

## 2014-11-05 LAB — APTT: aPTT: 26 seconds (ref 24–37)

## 2014-11-05 LAB — URINE MICROSCOPIC-ADD ON

## 2014-11-05 LAB — HEPARIN LEVEL (UNFRACTIONATED): Heparin Unfractionated: 0.67 IU/mL (ref 0.30–0.70)

## 2014-11-05 LAB — MRSA PCR SCREENING: MRSA BY PCR: NEGATIVE

## 2014-11-05 LAB — TROPONIN I: TROPONIN I: 0.03 ng/mL (ref <0.031)

## 2014-11-05 LAB — PROTIME-INR
INR: 0.96 (ref 0.00–1.49)
Prothrombin Time: 12.8 seconds (ref 11.6–15.2)

## 2014-11-05 LAB — TSH: TSH: 2.721 u[IU]/mL (ref 0.350–4.500)

## 2014-11-05 MED ORDER — METOPROLOL TARTRATE 1 MG/ML IV SOLN
5.0000 mg | Freq: Once | INTRAVENOUS | Status: AC
  Administered 2014-11-05: 5 mg via INTRAVENOUS

## 2014-11-05 MED ORDER — SODIUM CHLORIDE 0.9 % IV SOLN
INTRAVENOUS | Status: DC
  Administered 2014-11-05: 1000 mL via INTRAVENOUS

## 2014-11-05 MED ORDER — METOPROLOL TARTRATE 25 MG PO TABS
25.0000 mg | ORAL_TABLET | Freq: Two times a day (BID) | ORAL | Status: DC
  Administered 2014-11-05 – 2014-11-06 (×3): 25 mg via ORAL
  Filled 2014-11-05 (×3): qty 1

## 2014-11-05 MED ORDER — DIGOXIN 0.25 MG/ML IJ SOLN
0.5000 mg | Freq: Once | INTRAMUSCULAR | Status: AC
  Administered 2014-11-05: 0.5 mg via INTRAVENOUS
  Filled 2014-11-05: qty 2

## 2014-11-05 MED ORDER — METOPROLOL TARTRATE 1 MG/ML IV SOLN
INTRAVENOUS | Status: AC
  Filled 2014-11-05: qty 5

## 2014-11-05 MED ORDER — ALPRAZOLAM 0.25 MG PO TABS
0.2500 mg | ORAL_TABLET | Freq: Three times a day (TID) | ORAL | Status: DC | PRN
  Administered 2014-11-05: 0.25 mg via ORAL
  Filled 2014-11-05 (×2): qty 1

## 2014-11-05 MED ORDER — DILTIAZEM HCL 100 MG IV SOLR
5.0000 mg/h | INTRAVENOUS | Status: DC
  Administered 2014-11-05: 12 mg/h via INTRAVENOUS
  Administered 2014-11-05: 15 mg/h via INTRAVENOUS
  Administered 2014-11-06: 5 mg/h via INTRAVENOUS
  Filled 2014-11-05: qty 100

## 2014-11-05 MED ORDER — SODIUM CHLORIDE 0.9 % IV BOLUS (SEPSIS)
500.0000 mL | Freq: Once | INTRAVENOUS | Status: AC
  Administered 2014-11-05: 500 mL via INTRAVENOUS

## 2014-11-05 MED ORDER — DILTIAZEM LOAD VIA INFUSION
20.0000 mg | Freq: Once | INTRAVENOUS | Status: AC
  Administered 2014-11-05: 20 mg via INTRAVENOUS
  Filled 2014-11-05: qty 20

## 2014-11-05 MED ORDER — DILTIAZEM HCL 100 MG IV SOLR
5.0000 mg/h | INTRAVENOUS | Status: DC
  Administered 2014-11-05: 5 mg/h via INTRAVENOUS
  Filled 2014-11-05: qty 100

## 2014-11-05 MED ORDER — DIGOXIN 0.25 MG/ML IJ SOLN
0.2500 mg | Freq: Four times a day (QID) | INTRAMUSCULAR | Status: AC
  Administered 2014-11-05: 0.25 mg via INTRAVENOUS
  Filled 2014-11-05: qty 2

## 2014-11-05 MED ORDER — HEPARIN (PORCINE) IN NACL 100-0.45 UNIT/ML-% IJ SOLN
1150.0000 [IU]/h | INTRAMUSCULAR | Status: DC
  Administered 2014-11-05 – 2014-11-06 (×2): 1250 [IU]/h via INTRAVENOUS
  Filled 2014-11-05 (×2): qty 250

## 2014-11-05 MED ORDER — HEPARIN BOLUS VIA INFUSION
4000.0000 [IU] | Freq: Once | INTRAVENOUS | Status: AC
  Administered 2014-11-05: 4000 [IU] via INTRAVENOUS

## 2014-11-05 NOTE — Progress Notes (Signed)
ANTICOAGULATION CONSULT NOTE - Follow Up Consult  Pharmacy Consult for Heparin Indication: atrial fibrillation  Allergies  Allergen Reactions  . Bee Venom Anaphylaxis  . Epinephrine Other (See Comments)    High pulse rate.  . Sulfa Antibiotics Nausea And Vomiting  . Tetanus Toxoids Swelling    Entire arm swelled  . Vancomycin Nausea And Vomiting  . Adhesive [Tape] Rash    Her skin peels off.  . Influenza Vaccine Live Swelling and Rash  . Ivp Dye [Iodinated Diagnostic Agents] Rash  . Zyloprim [Allopurinol] Rash    Patient Measurements: Height: 5\' 5"  (165.1 cm) Weight: 205 lb 0.4 oz (93 kg) IBW/kg (Calculated) : 57 Heparin Dosing Weight: 78 kg  Vital Signs: Temp: 97.6 F (36.4 C) (01/04 0846) Temp Source: Oral (01/04 0846) BP: 134/81 mmHg (01/04 1700) Pulse Rate: 116 (01/04 1700)  Labs:  Recent Labs  11/05/14 0904 11/05/14 1803  HGB 14.1  --   HCT 42.5  --   PLT 351  --   APTT 26  --   LABPROT 12.8  --   INR 0.96  --   HEPARINUNFRC  --  0.67  CREATININE 1.05  --   TROPONINI 0.03  --     Estimated Creatinine Clearance: 53 mL/min (by C-G formula based on Cr of 1.05).   Medications:  Scheduled:  . digoxin  0.25 mg Intravenous Q6H  . metoprolol tartrate  25 mg Oral BID   Infusions:  . diltiazem (CARDIZEM) infusion 15 mg/hr (11/05/14 1700)  . heparin 1,250 Units/hr (11/05/14 1700)    Assessment: Okay for Protocol, Heparin level @ goal.  CHADS2Vasc score is 3, continue heparin for now. Likely change over to eliquis tomorrow per cards.  Goal of Therapy:  Heparin level 0.3-0.7 units/ml Monitor platelets by anticoagulation protocol: Yes   Plan:  Continue Heparin current rate. F/U daily labs.  Monique James 11/05/2014,6:52 PM

## 2014-11-05 NOTE — H&P (Signed)
History and Physical  Monique James TGP:[id number] DOB: 1941/08/21 DOA: 11/05/2014  Referring physician: Delphina Cahill, ER physician PCP: Vikki Ports, MD   Chief Complaint: Fast heart rate  HPI: Monique James is a 74 y.o. female  Past mental history of obesity and hypertension who is quite active when today she was scraping frost off of her windshield and she suddenly started feeling very lightheaded, weak and sweaty. She came back in side and checked her blood pressure and noted that her heart rate was quite elevated. Patient called her daughter and came to the emergency room where she was found to be in rapid atrial fibrillation. Patient has not had any previous history of this and has noted no previous episodes. She has not felt any skipped beats or palpitations, just the sudden onset of sweatiness and weakness. She is not recalled any previous episodes. Lab work done noted only a minimally elevated troponin 0.03 a white blood cell count of 12 with a normal shift. Patient was given a dose of IV Cardizem but did not convert and started on Cardizem drip. Hospitalists were called for further evaluation. Cardiology saw patient in consultation   Review of Systems:  Patient seen after arrival to stepdown unit. Pt with no complaints.  Pt denies any headaches, vision changes, dysphagia, chest pain, palpitations, shortness of breath, wheeze, cough, abdominal pain, hematuria, dysuria, constipation, diarrhea, focal extremity numbness or weakness or pain.  Review of systems are otherwise negative  Past Medical History  Diagnosis Date  . Hypertension   . Macular degeneration of right eye     Dr. Mikey Bussing (mild)--MISDIAGNOSED; 12/2013 told scarring, NOT macular degeneration  . Colon polyps     Dr. Oletta Lamas  . Osteopenia     mild  . Hyperlipidemia     elevated trigs and LDL  . Hx of pelvic mass     complex, benign pathology  . Hemorrhoids   . History of IBS   . Constipation   . Colon polyps     path was small leiomyoma  . Gout     history of (1985ish)  . Hyponatremia     mild  . 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 Shaune Leeks)   Past Surgical History  Procedure Laterality Date  . Bilateral salpingoophorectomy  2005    benign tumor/cyst (Dr. Aldean Ast)  . Abdominal hysterectomy  1970    for perforated uterus from IUD   Social History:  reports that she has never smoked. She has never used smokeless tobacco. She reports that she drinks about 8.4 oz of alcohol per week. She reports that she does not use illicit drugs. Patient lives at home by herself & is able to participate in activities of daily living with out assistance, she is quite active  Allergies  Allergen Reactions  . Bee Venom Anaphylaxis  . Epinephrine Other (See Comments)    High pulse rate.  . Sulfa Antibiotics Nausea And Vomiting  . Tetanus Toxoids Swelling    Entire arm swelled  . Vancomycin Nausea And Vomiting  . Adhesive [Tape] Rash    Her skin peels off.  . Influenza Vaccine Live Swelling and Rash  . Ivp Dye [Iodinated Diagnostic Agents] Rash  . Zyloprim [Allopurinol] Rash    Family History  Problem Relation Age of Onset  . Hypertension Mother   . Heart disease Mother     MI  . Dementia Mother   . Cancer Father     stomach  .  Cancer Brother     multiple myeloma  . Diabetes Neg Hx   . Breast cancer Neg Hx   . Colon cancer Neg Hx   . Healthy Daughter   . Healthy Daughter   . Healthy Daughter     As discussed with patient  Prior to Admission medications   Medication Sig Start Date End Date Taking? Authorizing Provider  aspirin EC 81 MG tablet Take 81 mg by mouth daily.   Yes Historical Provider, MD  Biotin 5000 MCG TABS Take 1 tablet by mouth daily.   Yes Historical Provider, MD  Calcium Carbonate-Vitamin D (CALCIUM 600+D) 600-400 MG-UNIT per tablet Take 1 tablet by mouth daily.    Yes Historical Provider, MD  FIBER SELECT GUMMIES PO Take 1 each by mouth daily.   Yes  Historical Provider, MD  losartan (COZAAR) 100 MG tablet Take 1 tablet (100 mg total) by mouth daily. 02/08/14 02/14/15 Yes Rita Ohara, MD  Multiple Vitamins-Minerals (MULTIVITAMIN WITH MINERALS) tablet Take 1 tablet by mouth daily.     Yes Historical Provider, MD    Physical Exam: BP 134/81 mmHg  Pulse 116  Temp(Src) 97.6 F (36.4 C) (Oral)  Resp 17  Ht _0  (1.651 m)  Wt 93 kg (205 lb 0.4 oz)  BMI 34.12 kg/m2  SpO2 98%  General:  Alert and oriented 3, no acute distress Eyes: Sclera nonicteric, extraocular movements are intact ENT: Normocephalic, atraumatic, mucous members are moist Neck: No JVD Cardiovascular: Irregular rhythm, tachycardic Respiratory: Clear to auscultation bilaterally Abdomen: Soft, nontender, obese, positive bowel sounds Skin: No skin breaks, tears or lesions Musculoskeletal: No clubbing or cyanosis or edema Psychiatric: Patient is appropriate, no evidence of psychoses Neurologic: No focal deficits           Labs on Admission:  Basic Metabolic Panel:  Recent Labs Lab 11/05/14 0904  NA 134*  K 4.2  CL 100  CO2 25  GLUCOSE 118*  BUN 17  CREATININE 1.05  CALCIUM 9.9   Liver Function Tests:  Recent Labs Lab 11/05/14 0904  AST 21  ALT 18  ALKPHOS 57  BILITOT 0.4  PROT 7.1  ALBUMIN 4.1   No results for input(s): LIPASE, AMYLASE in the last 168 hours. No results for input(s): AMMONIA in the last 168 hours. CBC:  Recent Labs Lab 11/05/14 0904  WBC 12.0*  NEUTROABS 8.6*  HGB 14.1  HCT 42.5  MCV 96.8  PLT 351   Cardiac Enzymes:  Recent Labs Lab 11/05/14 0904  TROPONINI 0.03    BNP (last 3 results) No results for input(s): PROBNP in the last 8760 hours. CBG: No results for input(s): GLUCAP in the last 168 hours.  Radiological Exams on Admission: Dg Chest Portable 1 View  11/05/2014   CLINICAL DATA:  Dizziness and chest pain.  Shortness of breath.  EXAM: PORTABLE CHEST - 1 VIEW  COMPARISON:  03/01/2009.  FINDINGS:  Cardiomegaly. No infiltrates or failure. No effusion or pneumothorax.  IMPRESSION: Cardiomegaly.  No active disease.  Similar appearance to priors.   Electronically Signed   By: Rolla Flatten M.D.   On: 11/05/2014 09:33    EKG: Independently reviewed. Atrial fibrillation with rapid ventricular rate  Assessment/Plan Present on Admission:  . Atrial fibrillation with rapid ventricular response: Hopefully will self convert. Try to get heart rate down with beta blocker and IV Cardizem. If not, will check echo and may need cardioversion  . Hypertension: Continue ARB. Blood pressure slightly soft given rapid atrial fibrillation  .  Obesity: Patient meets criteria with BMI greater than 30  Consultants: Cardiology  Code Status: Full code  Family Communication: Daughter at the bedside   Disposition Plan: Hopefully will self convert or be cardioverted. Home after  Time spent: 35 minutes  [facility] Pager [phone]

## 2014-11-05 NOTE — ED Notes (Signed)
Increased cardizem to 10mg /hr via pump.  Hr 130-150's.

## 2014-11-05 NOTE — ED Notes (Signed)
Hr greater than 200, Afib on EMS monitor.  Given cardizem 30mg  total.

## 2014-11-05 NOTE — ED Notes (Signed)
MD at bedside. 

## 2014-11-05 NOTE — ED Notes (Signed)
Hospitalist at bedside 

## 2014-11-05 NOTE — ED Notes (Signed)
Following getting dose of lopressor 5mg  hr 109-114.  Afib.

## 2014-11-05 NOTE — Care Management Utilization Note (Signed)
UR complete 

## 2014-11-05 NOTE — Consult Note (Signed)
CARDIOLOGY CONSULT NOTE   Patient ID: Monique James MRN: [id number] DOB/AGE: 03/13/1941 74 y.o.  Admit Date: 11/05/2014 Referring Physician: ER Physician Primary Physician: Monique Ports, MD Consulting Cardiologist: Monique Dolly MD Primary Cardiologist: New Reason for Consultation: New onset Atrial fib  Clinical Summary 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 She was in her usual state of health when she was outside scraping frost off of her windshield, got very sweaty, and started feeling weak. Thought it was related to the exertion. He came inside and took her blood pressure and noticed that her heart rate was very elevated. Called her daughter who brought her to the ER. She is unaware that her heart rate is elevated, does not know how long it has been going on, that did have symptoms, only this morning. She has been under a lot of stress and feeling fatigued over the holidays, it is also the anniversary of her husband who is a former 66 death, and felt her fatigue was related to all of that. She denies prior history of obstructive sleep apnea, thyroid disease, recent infections, or sick contacts.  On arrival to ER, the patient was found to be in A. fib RVR with inferior lateral ST depression, likely rate related.  She was not found to be anemic, she did have elevated white blood cells 12.0. Creatinine 1.05. Initial troponin negative at 0.03. TSH normal at 2.721. Chest x-ray revealed cardiomegaly without evidence of pneumonia or CHF. She was treated with IV diltiazem and started on a drip, which has been titrated to 15 mg an hour. Heart rate remains elevated at 140-157 bpm during my assessment. She denies chest pain, dizziness, or dyspnea.    Allergies  Allergen Reactions  . Bee Venom Anaphylaxis  . Epinephrine Other (See Comments)    High pulse rate.  . Sulfa  Antibiotics Nausea And Vomiting  . Tetanus Toxoids Swelling    Entire arm swelled  . Vancomycin Nausea And Vomiting  . Adhesive [Tape] Rash    Her skin peels off.  . Influenza Vaccine Live Swelling and Rash  . Ivp Dye [Iodinated Diagnostic Agents] Rash  . Zyloprim [Allopurinol] Rash    Medications Scheduled Medications:    Infusions: . diltiazem (CARDIZEM) infusion 15 mg/hr (11/05/14 1005)    PRN Medications:     Past Medical History  Diagnosis Date  . Hypertension   . Macular degeneration of right eye     Dr. Mikey James (mild)--MISDIAGNOSED; 12/2013 told scarring, NOT macular degeneration  . Colon polyps     Dr. Oletta James  . Osteopenia     mild  . Hyperlipidemia     elevated trigs and LDL  . Hx of pelvic mass     complex, benign pathology  . Hemorrhoids   . History of IBS   . Constipation   . Colon polyps     path was small leiomyoma  . Gout     history of (1985ish)  . Hyponatremia     mild  . 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 Bilateral salpingoophorectomy  2005    benign tumor/cyst (Dr. Aldean James)  . Abdominal hysterectomy  1970    for perforated uterus from IUD    Family History  Problem Relation Age of Onset  . Hypertension Mother   .  Heart disease Mother     MI  . Dementia Mother   . Cancer Father     stomach  . Cancer Brother     multiple myeloma  . Diabetes Neg Hx   . Breast cancer Neg Hx   . Colon cancer Neg Hx   . Healthy Daughter   . Healthy Daughter   . Healthy Daughter     Social History [name] reports that she has never smoked. She has never used smokeless tobacco. [name] reports that she drinks alcohol.  Review of Systems Complete review of systems are found to be negative unless outlined in H&P above.  Physical Examination Blood pressure 114/82, pulse 155, temperature 97.6 F (36.4 C), temperature source Oral, resp. rate 22, height _0   (1.651 m), weight 200 lb (90.719 kg), SpO2 97 %. No intake or output data in the 24 hours ending 11/05/14 1048  Telemetry:  GEN: HEENT: Conjunctiva and lids normal, oropharynx clear with moist mucosa. Neck: Supple, no elevated JVP or carotid bruits, no thyromegaly. Lungs: Clear to auscultation, nonlabored breathing at rest. Cardiac: Regular rate and rhythm, no S3 or significant systolic murmur, no pericardial rub. Abdomen: Soft, nontender, no hepatomegaly, bowel sounds present, no guarding or rebound. Extremities: No pitting edema, distal pulses 2+. Skin: Warm and dry. Musculoskeletal: No kyphosis. Neuropsychiatric: Alert and oriented x3, affect grossly appropriate.  Prior Cardiac Testing/Procedures None Lab Results  Basic Metabolic Panel:  Recent Labs Lab 11/05/14 0904  NA 134*  K 4.2  CL 100  CO2 25  GLUCOSE 118*  BUN 17  CREATININE 1.05  CALCIUM 9.9    Liver Function Tests:  Recent Labs Lab 11/05/14 0904  James 21  ALT 18  ALKPHOS 57  BILITOT 0.4  PROT 7.1  ALBUMIN 4.1    CBC:  Recent Labs Lab 11/05/14 0904  WBC 12.0*  NEUTROABS 8.6*  HGB 14.1  HCT 42.5  MCV 96.8  PLT 351    Cardiac Enzymes:  Recent Labs Lab 11/05/14 0904  TROPONINI 0.03     Radiology: Dg Chest Portable 1 View  11/05/2014   CLINICAL DATA:  Dizziness and chest pain.  Shortness of breath.  EXAM: PORTABLE CHEST - 1 VIEW  COMPARISON:  03/01/2009.  FINDINGS: Cardiomegaly. No infiltrates or failure. No effusion or pneumothorax.  IMPRESSION: Cardiomegaly.  No active disease.  Similar appearance to priors.   Electronically Signed   By: Rolla Flatten M.D.   On: 11/05/2014 09:33     NLG:XQJJHE fib with RVR, rate of 151 bpm    Impression and Recommendations  1. New Onset Afib: Patient is uncertain how long this has been occurring that states she became symptomatic this a.m. while cleaning up her windshield. She is unable to tell that her heart rate is elevated, but has been  fatigued over the last several weeks , since Thanksgiving. She has no prior cardiac history. She is currently on a diltiazem drip at 15 mg an hour after a bolus. I will give her one dose of IV Lopressor 5 mg, put her on a heparin drip. Can consider TEE cardioversion if she does not convert on her own. TSH is normal. If she does not respond to beta blocker or diltiazem, amiodarone could be considered Recommend admission to ICU for medication titration on diltiazem gtt and close monitoring.   2. Hypertension: Blood pressure is elevated. Uncertain of accuracy with elevated heart rate. We will monitor closely. Echocardiogram will be completed when HR is better controlled. [name] Kitchen  3. Mild leukocytosis: No evidence of pneumonia or CHF. Will check UA.  4. Situational anxiety: Will give one dose of Xanax 0.25 mg X 1. Will defer to PTH to continue if they feel this is clinically necessary.   Signed: Phill Myron. Lawrence NP [place]  11/05/2014, 10:48 AM Co-Sign MD  Patient seen and discussed with NP Purcell Nails. 74 yo female hx of HTN, hyperlipidemia but no cardiac history admitted with dizziness. In ER noted to be in afib with RVR, a new diagnosis for the patient. She was started on dilt gtt for rate control.    Cr 1.05, GFR 51, WBC 12, Hgb 14.1, Plt 351, TSH 2.7, trop neg x 1 CXR cardiomegaly, no acute process EKG afib rates 150   New onset afib, the exact onset is unclear as her main symptoms are generalized fatigue. Difficult to pinpoint if initial onset today or if has had prior episodes. Rates not controlled with IV dilt gtt, bp's somewhat soft. Will load with IV digoxin 0.39m IV x1, then 0.246mIV q 6 hrs for total of 1 mg. Likely start oral maintence digoxin tomorrow.  CHADS2Vasc score is 3, continue heparin for now. Likely change over to eliquis tomorrow. Obtain echo once rates better controlled.   J Zandra AbtsD

## 2014-11-05 NOTE — Progress Notes (Signed)
ANTICOAGULATION CONSULT NOTE - Initial Consult  Pharmacy Consult for Heparin Indication: atrial fibrillation  Allergies  Allergen Reactions  . Bee Venom Anaphylaxis  . Epinephrine Other (See Comments)    High pulse rate.  . Sulfa Antibiotics Nausea And Vomiting  . Tetanus Toxoids Swelling    Entire arm swelled  . Vancomycin Nausea And Vomiting  . Adhesive [Tape] Rash    Her skin peels off.  . Influenza Vaccine Live Swelling and Rash  . Ivp Dye [Iodinated Diagnostic Agents] Rash  . Zyloprim [Allopurinol] Rash    Patient Measurements: Height: 5\' 5"  (165.1 cm) Weight: 200 lb (90.719 kg) IBW/kg (Calculated) : 57 Heparin dosing weight:  77Kg  Vital Signs: Temp: 97.6 F (36.4 C) (01/04 0846) Temp Source: Oral (01/04 0846) BP: 119/87 mmHg (01/04 1116) Pulse Rate: 115 (01/04 1115)  Labs:  Recent Labs  11/05/14 0904  HGB 14.1  HCT 42.5  PLT 351  APTT 26  LABPROT 12.8  INR 0.96  CREATININE 1.05  TROPONINI 0.03    Estimated Creatinine Clearance: 52.3 mL/min (by C-G formula based on Cr of 1.05).   Medical History: Past Medical History  Diagnosis Date  . Hypertension   . Macular degeneration of right eye     Dr. Mikey Bussing (mild)--MISDIAGNOSED; 12/2013 told scarring, NOT macular degeneration  . Colon polyps     Dr. Oletta Lamas  . Osteopenia     mild  . Hyperlipidemia     elevated trigs and LDL  . Hx of pelvic mass     complex, benign pathology  . Hemorrhoids   . History of IBS   . Constipation   . Colon polyps     path was small leiomyoma  . Gout     history of (1985ish)  . Hyponatremia     mild  . 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 Shaune Leeks)    Assessment: Asked to initiate Heparin on this 74yo female for atrial fibrillation.  Baseline coags reviewed.    Goal of Therapy:  Achieve Heparin level 0.3 - 0.7 w/in 24hrs of initiation Monitor platelets by anticoagulation protocol: Yes   Plan:  Heparin 4000 units IV now x 1 Heparin infusion at 1250  units/hr Heparin level in 6 hrs then daily CBC daily while on Heparin  [place] Crane, Keysi Oelkers A 11/05/2014,11:35 AM

## 2014-11-05 NOTE — ED Provider Notes (Signed)
CSN: [id number]     Arrival date & time 11/05/14  7048 History  This chart was scribed for [facility] R. Alvino Chapel, MD by Stephania Fragmin, ED Scribe. This patient was seen in room APA18/APA18 and the patient's care was started at 8:57 AM.    Chief Complaint  Patient presents with  . Dizziness   The history is provided by the patient. No language interpreter was used.     HPI Comments:  Monique James is a 74 y.o. female brought in by ambulance who presents to the Emergency Department complaining of sudden onset dizziness that occurred this morning when patient was wiping frost from her car window to take her dog to the vet. She checked her pulse on a home blood pressure monitor, and it was high. Patient also started to have some chest tightness and diaphoresis, which prompted her daughter to call for EMS. Patient denies history of Afib. She denies recent cough, pneumonia, stimulant usage, smoking, leg swelling, and a history thyroid problems. Patient does not have a cardiologist. Dr. Tomi Bamberger is her PCP.  Past Medical History  Diagnosis Date  . Hypertension   . Macular degeneration of right eye     Dr. Mikey Bussing (mild)--MISDIAGNOSED; 12/2013 told scarring, NOT macular degeneration  . Colon polyps     Dr. Oletta Lamas  . Osteopenia     mild  . Hyperlipidemia     elevated trigs and LDL  . Hx of pelvic mass     complex, benign pathology  . Hemorrhoids   . History of IBS   . Constipation   . Colon polyps     path was small leiomyoma  . Gout     history of (1985ish)  . Hyponatremia     mild  . 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 Shaune Leeks)   Past Surgical History  Procedure Laterality Date  . Bilateral salpingoophorectomy  2005    benign tumor/cyst (Dr. Aldean Ast)  . Abdominal hysterectomy  1970    for perforated uterus from IUD   Family History  Problem Relation Age of Onset  . Hypertension Mother   . Heart disease Mother     MI  . Dementia Mother   . Cancer Father     stomach  .  Cancer Brother     multiple myeloma  . Diabetes Neg Hx   . Breast cancer Neg Hx   . Colon cancer Neg Hx   . Healthy Daughter   . Healthy Daughter   . Healthy Daughter    History  Substance Use Topics  . Smoking status: Never Smoker   . Smokeless tobacco: Never Used  . Alcohol Use: Yes     Comment: 1-2 glass of wine daily.   OB History    Gravida Para Term Preterm AB TAB SAB Ectopic Multiple Living   _0 Obstetric Comments   1 set of twins; 3 pregnancies, 1 miscarriage     Review of Systems  Constitutional: Positive for diaphoresis.  Respiratory: Positive for chest tightness. Negative for cough.   Cardiovascular: Negative for leg swelling.  All other systems reviewed and are negative.     Allergies  Bee venom; Epinephrine; Sulfa antibiotics; Tetanus toxoids; Vancomycin; Adhesive; Influenza vaccine live; Ivp dye; and Zyloprim  Home Medications   Prior to Admission medications   Medication Sig Start Date End Date Taking? Authorizing Provider  aspirin EC 81 MG  tablet Take 81 mg by mouth daily.   Yes Historical Provider, MD  Biotin 5000 MCG TABS Take 1 tablet by mouth daily.   Yes Historical Provider, MD  Calcium Carbonate-Vitamin D (CALCIUM 600+D) 600-400 MG-UNIT per tablet Take 1 tablet by mouth daily.    Yes Historical Provider, MD  FIBER SELECT GUMMIES PO Take 1 each by mouth daily.   Yes Historical Provider, MD  losartan (COZAAR) 100 MG tablet Take 1 tablet (100 mg total) by mouth daily. 02/08/14 02/14/15 Yes Rita Ohara, MD  Multiple Vitamins-Minerals (MULTIVITAMIN WITH MINERALS) tablet Take 1 tablet by mouth daily.     Yes Historical Provider, MD   BP 114/82 mmHg  Pulse 155  Temp(Src) 97.6 F (36.4 C) (Oral)  Resp 22  Ht _0  (1.651 m)  Wt 200 lb (90.719 kg)  BMI 33.28 kg/m2  SpO2 97% Physical Exam  Constitutional: She is oriented to person, place, and time. She appears well-developed and well-nourished. No distress.  HENT:  Head: Normocephalic  and atraumatic.  Eyes: Conjunctivae and EOM are normal.  Neck: Neck supple. No JVD present. No tracheal deviation present. No thyromegaly present.  Cardiovascular:  Tachycardic. No peripheral swelling.   Pulmonary/Chest: Effort normal and breath sounds normal. No respiratory distress. She has no wheezes. She has no rales.  Lungs are clear to auscultation.  Musculoskeletal: Normal range of motion.  Neurological: She is alert and oriented to person, place, and time.  Skin: Skin is warm and dry.  Psychiatric: She has a normal mood and affect. Her behavior is normal.  Nursing note and vitals reviewed.   ED Course  Procedures (including critical care time)  DIAGNOSTIC STUDIES: Oxygen Saturation is 99% on room air, normal by my interpretation.    COORDINATION OF CARE: 9:01 AM - Discussed treatment plan with pt at bedside which includes medication and monitoring, tests, and likely hospital admission, and pt agreed to plan.   Labs Review Labs Reviewed  CBC WITH DIFFERENTIAL - Abnormal; Notable for the following:    WBC 12.0 (*)    Neutro Abs 8.6 (*)    All other components within normal limits  COMPREHENSIVE METABOLIC PANEL - Abnormal; Notable for the following:    Sodium 134 (*)    Glucose, Bld 118 (*)    GFR calc non Af Amer 51 (*)    GFR calc Af Amer 59 (*)    All other components within normal limits  TROPONIN I  TSH  PROTIME-INR  APTT    Imaging Review Dg Chest Portable 1 View  11/05/2014   CLINICAL DATA:  Dizziness and chest pain.  Shortness of breath.  EXAM: PORTABLE CHEST - 1 VIEW  COMPARISON:  03/01/2009.  FINDINGS: Cardiomegaly. No infiltrates or failure. No effusion or pneumothorax.  IMPRESSION: Cardiomegaly.  No active disease.  Similar appearance to priors.   Electronically Signed   By: Rolla Flatten M.D.   On: 11/05/2014 09:33     EKG Interpretation   Date/Time:  Monday November 05 2014 08:54:23 EST Ventricular Rate:  150 PR Interval:    QRS Duration: 86 QT  Interval:  284 QTC Calculation: 449 R Axis:   58 Text Interpretation:  Atrial fibrillation with rapid V-rate Low voltage,  precordial leads Repolarization abnormality, prob rate related afib is new  Confirmed by Alvino Chapel  MD, Toshiyuki Fredell [phone]) on 11/05/2014 9:26:37 AM      MDM   Final diagnoses:  Atrial fibrillation  Atrial fibrillation with rapid ventricular response   Patient with  new onset A. fib with RVR. Began firmly around a half hour prior to arrival. States she feels somewhat bad all over. No chest pain. Rate still poorly controlled with Cardizem drip. EKG reassuring for ischemia. Negative enzymes. Will admit to internal medicine to a step down bed. Discussed with Dr. branch from cardiology who will also see the patient.  I personally performed the services described in this documentation, which was scribed in my presence. The recorded information has been reviewed and is accurate.  CRITICAL CARE Performed by: Mackie Pai Total critical care time: 30 Critical care time was exclusive of separately billable procedures and treating other patients. Critical care was necessary to treat or prevent imminent or life-threatening deterioration. Critical care was time spent personally by me on the following activities: development of treatment plan with patient and/or surrogate as well as nursing, discussions with consultants, evaluation of patient's response to treatment, examination of patient, obtaining history from patient or surrogate, ordering and performing treatments and interventions, ordering and review of laboratory studies, ordering and review of radiographic studies, pulse oximetry and re-evaluation of patient's condition.         Jasper Riling. Alvino Chapel, MD 11/05/14 1054

## 2014-11-06 DIAGNOSIS — I369 Nonrheumatic tricuspid valve disorder, unspecified: Secondary | ICD-10-CM

## 2014-11-06 LAB — BASIC METABOLIC PANEL
Anion gap: 7 (ref 5–15)
BUN: 11 mg/dL (ref 6–23)
CO2: 25 mmol/L (ref 19–32)
Calcium: 8.6 mg/dL (ref 8.4–10.5)
Chloride: 99 mEq/L (ref 96–112)
Creatinine, Ser: 0.86 mg/dL (ref 0.50–1.10)
GFR calc Af Amer: 75 mL/min — ABNORMAL LOW (ref >90)
GFR calc non Af Amer: 65 mL/min — ABNORMAL LOW (ref >90)
Glucose, Bld: 108 mg/dL — ABNORMAL HIGH (ref 70–99)
POTASSIUM: 3.5 mmol/L (ref 3.5–5.1)
SODIUM: 131 mmol/L — AB (ref 135–145)

## 2014-11-06 LAB — HEPARIN LEVEL (UNFRACTIONATED): HEPARIN UNFRACTIONATED: 0.73 [IU]/mL — AB (ref 0.30–0.70)

## 2014-11-06 LAB — CBC
HCT: 39.2 % (ref 36.0–46.0)
HEMOGLOBIN: 13 g/dL (ref 12.0–15.0)
MCH: 32.5 pg (ref 26.0–34.0)
MCHC: 33.2 g/dL (ref 30.0–36.0)
MCV: 98 fL (ref 78.0–100.0)
PLATELETS: 332 10*3/uL (ref 150–400)
RBC: 4 MIL/uL (ref 3.87–5.11)
RDW: 13.6 % (ref 11.5–15.5)
WBC: 10 10*3/uL (ref 4.0–10.5)

## 2014-11-06 MED ORDER — DILTIAZEM HCL 30 MG PO TABS
30.0000 mg | ORAL_TABLET | Freq: Three times a day (TID) | ORAL | Status: DC
  Administered 2014-11-06: 30 mg via ORAL
  Filled 2014-11-06: qty 1

## 2014-11-06 MED ORDER — OFF THE BEAT BOOK
Freq: Once | Status: AC
  Administered 2014-11-06: 18:00:00
  Filled 2014-11-06: qty 1

## 2014-11-06 MED ORDER — DILTIAZEM HCL 30 MG PO TABS
30.0000 mg | ORAL_TABLET | Freq: Four times a day (QID) | ORAL | Status: DC
  Administered 2014-11-06 – 2014-11-07 (×4): 30 mg via ORAL
  Filled 2014-11-06 (×4): qty 1

## 2014-11-06 MED ORDER — APIXABAN 5 MG PO TABS
5.0000 mg | ORAL_TABLET | Freq: Two times a day (BID) | ORAL | Status: DC
  Administered 2014-11-06 – 2014-11-07 (×3): 5 mg via ORAL
  Filled 2014-11-06 (×3): qty 1

## 2014-11-06 NOTE — Progress Notes (Signed)
  Echocardiogram 2D Echocardiogram has been performed.  [place], [place] 11/06/2014, 4:40 PM

## 2014-11-06 NOTE — Care Management Note (Addendum)
    Page 1 of 1   11/07/2014     11:46:45 AM CARE MANAGEMENT NOTE 11/07/2014  Patient:  Monique James, Monique James   Account Number:  [id number]  Date Initiated:  11/06/2014  Documentation initiated by:  Jolene Provost  Subjective/Objective Assessment:   Pt admitted with new onset a-fib and is now converted to NSR. Pt is from home with self care, pt lives alone and independent with ADL's. Pt has no HH services, DME's or med needs prior to admission.     Action/Plan:   Pt plans to discharge home with self care. Pt started on Eliquis, benefits check for cost of drug pending but pre-auth is required. MD has been notified. Will continue to follow for CM needs.   Anticipated DC Date:  11/07/2014   Anticipated DC Plan:  [place]  CM consult      Choice offered to / List presented to:             Status of service:  Completed, signed off Medicare Important Message given?   (If response is "NO", the following Medicare IM given date fields will be blank) Date Medicare IM given:   Medicare IM given by:   Date Additional Medicare IM given:   Additional Medicare IM given by:    Discharge Disposition:  HOME/SELF CARE  Per UR Regulation:    If discussed at Long Length of Stay Meetings, dates discussed:    Comments:  11/07/14 [place], RN BSN CM Pt discharged home today. No CM needs noted. Pt has eliquis and cardiology will do pre auth.  11/06/2014 [place], RN, MSN, PCCN Pt called and updated insurance information (per benefits check) and notified of deductible and co-pay for Eliquis. Pt has no objections to cost of medication.  11/06/2014 [place], RN, MSN, [employer]

## 2014-11-06 NOTE — Progress Notes (Signed)
Called report to 300 floor RN and patient is to be transported after lunch

## 2014-11-06 NOTE — Progress Notes (Signed)
Consulting cardiologist: Carlyle Dolly MD Primary Cardiologist:Miasia Crabtree, Roderic Palau MD  Cardiology Specific Problem List: 1.New Onset Atrial fib with RVR 2. Hypertension   Subjective:    Feels much better. Converted to NSR around 11:30 last night.  Objective:   Temp:  [97.6 F (36.4 C)-98.4 F (36.9 C)] 98.3 F (36.8 C) (01/05 0400) Pulse Rate:  [34-165] 69 (01/05 0630) Resp:  [14-36] 15 (01/05 0630) BP: (102-153)/(39-102) 132/68 mmHg (01/05 0630) SpO2:  [93 %-99 %] 98 % (01/05 0630) Weight:  [200 lb (90.719 kg)-206 lb 5.6 oz (93.6 kg)] 206 lb 5.6 oz (93.6 kg) (01/05 0500) Last BM Date: 11/05/14  Filed Weights   11/05/14 0846 11/05/14 1354 11/06/14 0500  Weight: 200 lb (90.719 kg) 205 lb 0.4 oz (93 kg) 206 lb 5.6 oz (93.6 kg)    Intake/Output Summary (Last 24 hours) at 11/06/14 0746 Last data filed at 11/06/14 0600  Gross per 24 hour  Intake 566.83 ml  Output    850 ml  Net -283.17 ml    Telemetry:NSR rates in the 60's. Exam:  General: No acute distress.  HEENT: Conjunctiva and lids normal, oropharynx clear.  Lungs: Clear to auscultation, nonlabored.  Cardiac: No elevated JVP or bruits. RRR, no gallop or rub.   Abdomen: Normoactive bowel sounds, nontender, nondistended.  Extremities: No pitting edema, distal pulses full.  Neuropsychiatric: Alert and oriented x3, affect appropriate.   Lab Results:  Basic Metabolic Panel:  Recent Labs Lab 11/05/14 0904 11/06/14 0430  NA 134* 131*  K 4.2 3.5  CL 100 99  CO2 25 25  GLUCOSE 118* 108*  BUN 17 11  CREATININE 1.05 0.86  CALCIUM 9.9 8.6    Liver Function Tests:  Recent Labs Lab 11/05/14 0904  AST 21  ALT 18  ALKPHOS 57  BILITOT 0.4  PROT 7.1  ALBUMIN 4.1    CBC:  Recent Labs Lab 11/05/14 0904 11/06/14 0430  WBC 12.0* 10.0  HGB 14.1 13.0  HCT 42.5 39.2  MCV 96.8 98.0  PLT 351 332    Cardiac Enzymes:  Recent Labs Lab 11/05/14 0904  TROPONINI 0.03     Coagulation:  Recent Labs Lab 11/05/14 0904  INR 0.96    Radiology: Dg Chest Portable 1 View  11/05/2014   CLINICAL DATA:  Dizziness and chest pain.  Shortness of breath.  EXAM: PORTABLE CHEST - 1 VIEW  COMPARISON:  03/01/2009.  FINDINGS: Cardiomegaly. No infiltrates or failure. No effusion or pneumothorax.  IMPRESSION: Cardiomegaly.  No active disease.  Similar appearance to priors.   Electronically Signed   By: Rolla Flatten M.D.   On: 11/05/2014 09:33     HAL:[id number] this am.   Medications:   Scheduled Medications: . digoxin  0.25 mg Intravenous Q6H  . metoprolol tartrate  25 mg Oral BID    Infusions: . diltiazem (CARDIZEM) infusion 5 mg/hr (11/06/14 0600)  . heparin 1,250 Units/hr (11/06/14 0600)    PRN Medications: ALPRAZolam   Assessment and Plan:   1.Atrial fib with RVR: Now converted to NSR around 11:30 last evening. I will d/c IV diltiazem. Transition to po 30 mg Q 8 hrs. Then long acting on discharge. Continue dig and metoprolol. Check echo today. She states she has been under extreme stress with granddaughter who is ill and also holiday stress. She denies sleep apnea. May need to consider sleep study as OP. CHADS Score of 3. Will stop heparin and begin Eliquis 5 mg BID (creatinine 0.86, wt 96 kg). Can move  out of the unit and ambulate to assess her HR response to activity.  2. Hypertension: BP is well controlled. Was on ARB at home, but will not restart with CCB and BB.   Phill Myron. Lawrence NP [place]  11/06/2014, 7:46 AM   Attending Note Patient seen and discussed with NP Purcell Nails, I agree with her documentation above. Converted to NSR, will start oral dilt. Continue metoprolol as appears she will require 2 AV nodal agents for control. Will not start oral digoxin at this time. Agree with stopping heparin and starting eliquis 5mg  bid. Will order echo for today.  Ok to transfer to floor, anticipate likely discharge tomorrow.  Zandra Abts MD

## 2014-11-06 NOTE — Progress Notes (Signed)
[place] for Heparin Indication: atrial fibrillation  Allergies  Allergen Reactions  . Bee Venom Anaphylaxis  . Epinephrine Other (See Comments)    High pulse rate.  . Sulfa Antibiotics Nausea And Vomiting  . Tetanus Toxoids Swelling    Entire arm swelled  . Vancomycin Nausea And Vomiting  . Adhesive [Tape] Rash    Her skin peels off.  . Influenza Vaccine Live Swelling and Rash  . Ivp Dye [Iodinated Diagnostic Agents] Rash  . Zyloprim [Allopurinol] Rash    Patient Measurements: Height: 5\' 5"  (165.1 cm) Weight: 206 lb 5.6 oz (93.6 kg) IBW/kg (Calculated) : 57 Heparin Dosing Weight: 78 kg  Vital Signs: Temp: 98.3 F (36.8 C) (01/05 0400) Temp Source: Oral (01/05 0400) BP: 132/68 mmHg (01/05 0630) Pulse Rate: 69 (01/05 0630)  Labs:  Recent Labs  11/05/14 0904 11/05/14 1803 11/06/14 0430  HGB 14.1  --  13.0  HCT 42.5  --  39.2  PLT 351  --  332  APTT 26  --   --   LABPROT 12.8  --   --   INR 0.96  --   --   HEPARINUNFRC  --  0.67 0.73*  CREATININE 1.05  --  0.86  TROPONINI 0.03  --   --     Estimated Creatinine Clearance: 64.9 mL/min (by C-G formula based on Cr of 0.86).   Medications:  Scheduled:  . digoxin  0.25 mg Intravenous Q6H  . metoprolol tartrate  25 mg Oral BID   Infusions:  . diltiazem (CARDIZEM) infusion 5 mg/hr (11/06/14 0600)  . heparin 1,250 Units/hr (11/06/14 0600)    Assessment: 67 yoF admitted with new onset Afib.  Heparin level slightly above goal this morning. No bleeding noted.  CHADS2Vasc score = 3.  Noted cardiology plans to likely transition to Eliquis today.  Goal of Therapy:  Heparin level 0.3-0.7 units/ml Monitor platelets by anticoagulation protocol: Yes   Plan:  Decrease heparin to 1150 units/hr F/U daily heparin level & CBC F/U plans for long-term oral anticoagulation  Khali Albanese, Lavonia Drafts 11/06/2014,8:00 AM

## 2014-11-06 NOTE — Progress Notes (Signed)
PROGRESS NOTE  Monique James UKG:[id number] DOB: 02-16-1941 DOA: 11/05/2014 PCP: Vikki Ports, MD  HPI/Recap of past 67 hours: 74 year old female past mental history of obesity and hypertension presented with sudden onset lightheadedness and perspiration and found to be in rapid atrial fibrillation. Patient initially placed in stepdown unit on high dose IV Cardizem. Cardiology consulted  Converted. She is feeling much better. No complaints of chest pain or shortness of breath   Assessment/Plan: Active Problems:   Atrial fibrillation with rapid ventricular response: Currently rate controlled, patient cardiology help. Weaned off of Cardizem drip and on oral Cardizem and beta blocker. Echocardiogram ordered and is pending.    Hypertension: ARB will likely be discontinued in exchange for beta blocker and Cardizem    Obesity: Patient meets criteria with BMI greater than 30   Code Status: Full code  Family Communication: spoke with daughter yesterday   Disposition Plan: Monitor overnight, transfer to floor. Awaiting echocardiogram. Likely home tomorrow.    Consultants: Cardiology  Procedures:  Echocardiogram pending  Antibiotics:  None   Objective: BP 130/67 mmHg  Pulse 72  Temp(Src) 98.5 F (36.9 C) (Oral)  Resp 20  Ht 5\' 5"  (1.651 m)  Wt 93.6 kg (206 lb 5.6 oz)  BMI 34.34 kg/m2  SpO2 98%  Intake/Output Summary (Last 24 hours) at 11/06/14 1454 Last data filed at 11/06/14 0815  Gross per 24 hour  Intake 566.83 ml  Output   1150 ml  Net -583.17 ml   Filed Weights   11/05/14 0846 11/05/14 1354 11/06/14 0500  Weight: 90.719 kg (200 lb) 93 kg (205 lb 0.4 oz) 93.6 kg (206 lb 5.6 oz)    Exam:   General:  Alert and oriented 3, no acute distress   Cardiovascular: Currently regular rate and rhythm, S1-S2   Respiratory: clear to auscultation bilaterally  Abdomen: soft, nontender, nondistended, positive bowel sounds  Musculoskeletal: No clubbing or cyanosis,  trace edema    Data Reviewed: Basic Metabolic Panel:  Recent Labs Lab 11/05/14 0904 11/06/14 0430  NA 134* 131*  K 4.2 3.5  CL 100 99  CO2 25 25  GLUCOSE 118* 108*  BUN 17 11  CREATININE 1.05 0.86  CALCIUM 9.9 8.6   Liver Function Tests:  Recent Labs Lab 11/05/14 0904  AST 21  ALT 18  ALKPHOS 57  BILITOT 0.4  PROT 7.1  ALBUMIN 4.1   No results for input(s): LIPASE, AMYLASE in the last 168 hours. No results for input(s): AMMONIA in the last 168 hours. CBC:  Recent Labs Lab 11/05/14 0904 11/06/14 0430  WBC 12.0* 10.0  NEUTROABS 8.6*  --   HGB 14.1 13.0  HCT 42.5 39.2  MCV 96.8 98.0  PLT 351 332   Cardiac Enzymes:    Recent Labs Lab 11/05/14 0904  TROPONINI 0.03   BNP (last 3 results) No results for input(s): PROBNP in the last 8760 hours. CBG: No results for input(s): GLUCAP in the last 168 hours.  Recent Results (from the past 240 hour(s))  MRSA PCR Screening     Status: None   Collection Time: 11/05/14  1:45 PM  Result Value Ref Range Status   MRSA by PCR NEGATIVE NEGATIVE Final    Comment:        The GeneXpert MRSA Assay (FDA approved for NASAL specimens only), is one component of a comprehensive MRSA colonization surveillance program. It is not intended to diagnose MRSA infection nor to guide or monitor treatment for MRSA infections.  Studies: Dg Chest Portable 1 View  11/05/2014   CLINICAL DATA:  Dizziness and chest pain.  Shortness of breath.  EXAM: PORTABLE CHEST - 1 VIEW  COMPARISON:  03/01/2009.  FINDINGS: Cardiomegaly. No infiltrates or failure. No effusion or pneumothorax.  IMPRESSION: Cardiomegaly.  No active disease.  Similar appearance to priors.   Electronically Signed   By: Rolla Flatten M.D.   On: 11/05/2014 09:33    Scheduled Meds: . apixaban  5 mg Oral BID  . diltiazem  30 mg Oral 4 times per day  . metoprolol tartrate  25 mg Oral BID    Continuous Infusions:    Time spent:  15 minutes  [place] [facility] Pager [phone]. If 7PM-7AM, please contact night-coverage at [email or web address], password [facility] 11/06/2014, 2:54 PM  LOS: 1 day

## 2014-11-07 LAB — CBC
HCT: 39 % (ref 36.0–46.0)
HEMOGLOBIN: 12.6 g/dL (ref 12.0–15.0)
MCH: 31.9 pg (ref 26.0–34.0)
MCHC: 32.3 g/dL (ref 30.0–36.0)
MCV: 98.7 fL (ref 78.0–100.0)
PLATELETS: 302 10*3/uL (ref 150–400)
RBC: 3.95 MIL/uL (ref 3.87–5.11)
RDW: 13.2 % (ref 11.5–15.5)
WBC: 7.6 10*3/uL (ref 4.0–10.5)

## 2014-11-07 MED ORDER — ALPRAZOLAM 0.25 MG PO TABS: 0.2500 mg | ORAL_TABLET | Freq: Three times a day (TID) | ORAL | Status: DC | PRN

## 2014-11-07 MED ORDER — LOSARTAN POTASSIUM 25 MG PO TABS: 25.0000 mg | ORAL_TABLET | Freq: Every day | ORAL | Status: DC

## 2014-11-07 MED ORDER — DILTIAZEM HCL ER COATED BEADS 180 MG PO CP24
180.0000 mg | ORAL_CAPSULE | Freq: Every day | ORAL | Status: DC
  Administered 2014-11-07: 180 mg via ORAL
  Filled 2014-11-07: qty 1

## 2014-11-07 MED ORDER — APIXABAN 5 MG PO TABS: 5.0000 mg | ORAL_TABLET | Freq: Two times a day (BID) | ORAL | Status: DC

## 2014-11-07 MED ORDER — DILTIAZEM HCL ER COATED BEADS 180 MG PO CP24: 180.0000 mg | ORAL_CAPSULE | Freq: Every day | ORAL | Status: DC

## 2014-11-07 NOTE — Discharge Summary (Signed)
Physician Discharge Summary  Lynnix Schoneman Mccollom RAQ:[id number] DOB: 1941-08-23 DOA: 11/05/2014  PCP: Vikki Ports, MD  Admit date: 11/05/2014 Discharge date: 11/07/2014  Time spent: Greater than 30 minutes  Recommendations for Outpatient Follow-up:  1. Recommend rechecking the patient's serum sodium at the follow-up appointments.    Discharge Diagnoses:  1. Newly diagnosed atrial fibrillation with rapid ventricular response. 2. Hypertension. 3. Obesity. 4. Mild hyponatremia.  Discharge Condition: Improved.  Diet recommendation: Heart healthy.  Filed Weights   11/05/14 0846 11/05/14 1354 11/06/14 0500  Weight: 90.719 kg (200 lb) 93 kg (205 lb 0.4 oz) 93.6 kg (206 lb 5.6 oz)    History of present illness:  The patient is a 74 year old woman with a history of hypertension, who presented to the emergency department on 11/05/14 with a chief complaint of lightheadedness and sweatiness. She checked her blood pressure at home and found that her heart rate was quite elevated. On arrival to the emergency department, her heart rate was 157 with a blood pressure 130/80. She was oxygenating 97% on room air and she was afebrile. Her EKG revealed atrial fibrillation with a heart rate of 150 bpm. Her chest x-ray revealed cardiomegaly, but no active disease. Her troponin I was within normal limits. Her electrolytes were unremarkable with exception of a serum sodium of 134. She was admitted for further evaluation and management.  Hospital Course:    The patient was bolused with IV diltiazem and then started on a diltiazem drip in the stepdown unit. Losartan was continued, but was eventually titrated down and then discontinued due to "soft" blood pressures. For further evaluation, number studies were ordered. Cardiology was also consulted. Her troponin I was within normal limits. Her TSH was within normal limits at 2.7. Per cardiologist, Dr. Nelly Laurence initial assessment, he agreed with medical management. However,  he gave her 1 dose of IV metoprolol and consider TEE conversion if she did not convert on her own. Also, if she did not respond to beta blocker or diltiazem, amiodarone could be considered. He also added as needed Xanax for situational anxiety. Eventually, the patient's rhythm converted to normal sinus rhythm. Diltiazem was discontinued and she was transitioned to oral diltiazem and oral metoprolol. IV heparin was started empirically. Losartan was discontinued secondary to dual treatment for atrial fibrillation.  Her 2-D echocardiogram revealed moderate LVH and a left ventricular systolic ejection fraction of 65-70%. Per Dr. Harl Bowie, her CHADS score was 3. Heparin was discontinued and Eliquis was was started. (Cardiology provided the patient with some samples). Eventually, metoprolol was discontinued as it was felt that her heart rate could be controlled with monotherapy.  She remained in normal sinus rhythm. However, prior to discharge, she was moderately hypertensive, and therefore, losartan was restarted at a lower dose of 25 mg daily (down from her usual home dose of 100 mg daily). She was discharged in improved and stable condition. Would recommend rechecking her serum sodium at the hospital follow-up appointment as her serum sodium fell from 134-131 without any side effects or sequelae.   Procedures:  2-D echocardiogram: - Left ventricle: The cavity size was normal. Wall thickness was increased in a pattern of moderate LVH. Systolic function was vigorous. The estimated ejection fraction was in the range of 65% to 70%. Diastolic function is abnormal, indeterminant grade. Wall motion was normal; there were no regional wall motion abnormalities. - Aortic valve: Mildly calcified annulus. Trileaflet; mildly thickened leaflets. - Mitral valve: Mildly calcified annulus. Mildly thickened leaflets .  Consultations:  Cardiology  Discharge Exam: Filed Vitals:   11/07/14 0538  BP:  145/76  Pulse: 77  Temp: 98.1 F (36.7 C)  Resp: 16    General: Pleasant alert obese 74 year old woman sitting up in bed, in no acute distress. Cardiovascular: S1, S2, with no murmurs rubs or gallops. Respiratory: Clear to auscultation bilaterally.  Discharge Instructions   Discharge Instructions    Diet - low sodium heart healthy    Complete by:  As directed      Increase activity slowly    Complete by:  As directed           Current Discharge Medication List    START taking these medications   Details  ALPRAZolam (XANAX) 0.25 MG tablet Take 1 tablet (0.25 mg total) by mouth 3 (three) times daily as needed for anxiety. Qty: 15 tablet, Refills: 0    apixaban (ELIQUIS) 5 MG TABS tablet Take 1 tablet (5 mg total) by mouth 2 (two) times daily. "Blood Thinner" Qty: 60 tablet, Refills: 3    diltiazem (CARDIZEM CD) 180 MG 24 hr capsule Take 1 capsule (180 mg total) by mouth daily. Medication to control your heart rate. Qty: 30 capsule, Refills: 3      CONTINUE these medications which have CHANGED   Details  losartan (COZAAR) 25 MG tablet Take 1 tablet (25 mg total) by mouth daily. Qty: 30 tablet, Refills: 3      CONTINUE these medications which have NOT CHANGED   Details  Biotin 5000 MCG TABS Take 1 tablet by mouth daily.    Calcium Carbonate-Vitamin D (CALCIUM 600+D) 600-400 MG-UNIT per tablet Take 1 tablet by mouth daily.     FIBER SELECT GUMMIES PO Take 1 each by mouth daily.    Multiple Vitamins-Minerals (MULTIVITAMIN WITH MINERALS) tablet Take 1 tablet by mouth daily.        STOP taking these medications     aspirin EC 81 MG tablet        Allergies  Allergen Reactions  . Bee Venom Anaphylaxis  . Epinephrine Other (See Comments)    High pulse rate.  . Sulfa Antibiotics Nausea And Vomiting  . Tetanus Toxoids Swelling    Entire arm swelled  . Vancomycin Nausea And Vomiting  . Adhesive [Tape] Rash    Her skin peels off.  . Influenza Vaccine Live  Swelling and Rash  . Ivp Dye [Iodinated Diagnostic Agents] Rash  . Zyloprim [Allopurinol] Rash   Follow-up Information    Follow up with Jory Sims, NP.   Specialty:  Nurse Practitioner   Why:  Call the cardiology office if you do not hear from them in 3-5 days.   Contact information:   [place] [id number] [phone]        The results of significant diagnostics from this hospitalization (including imaging, microbiology, ancillary and laboratory) are listed below for reference.    Significant Diagnostic Studies: Dg Chest Portable 1 View  11/05/2014   CLINICAL DATA:  Dizziness and chest pain.  Shortness of breath.  EXAM: PORTABLE CHEST - 1 VIEW  COMPARISON:  03/01/2009.  FINDINGS: Cardiomegaly. No infiltrates or failure. No effusion or pneumothorax.  IMPRESSION: Cardiomegaly.  No active disease.  Similar appearance to priors.   Electronically Signed   By: Rolla Flatten M.D.   On: 11/05/2014 09:33    Microbiology: Recent Results (from the past 240 hour(s))  MRSA PCR Screening     Status: None   Collection Time: 11/05/14  1:45 PM  Result Value Ref Range Status   MRSA by PCR NEGATIVE NEGATIVE Final    Comment:        The GeneXpert MRSA Assay (FDA approved for NASAL specimens only), is one component of a comprehensive MRSA colonization surveillance program. It is not intended to diagnose MRSA infection nor to guide or monitor treatment for MRSA infections.      Labs: Basic Metabolic Panel:  Recent Labs Lab 11/05/14 0904 11/06/14 0430  NA 134* 131*  K 4.2 3.5  CL 100 99  CO2 25 25  GLUCOSE 118* 108*  BUN 17 11  CREATININE 1.05 0.86  CALCIUM 9.9 8.6   Liver Function Tests:  Recent Labs Lab 11/05/14 0904  AST 21  ALT 18  ALKPHOS 57  BILITOT 0.4  PROT 7.1  ALBUMIN 4.1   No results for input(s): LIPASE, AMYLASE in the last 168 hours. No results for input(s): AMMONIA in the last 168 hours. CBC:  Recent Labs Lab 11/05/14 0904  11/06/14 0430 11/07/14 0603  WBC 12.0* 10.0 7.6  NEUTROABS 8.6*  --   --   HGB 14.1 13.0 12.6  HCT 42.5 39.2 39.0  MCV 96.8 98.0 98.7  PLT 351 332 302   Cardiac Enzymes:  Recent Labs Lab 11/05/14 0904  TROPONINI 0.03   BNP: BNP (last 3 results) No results for input(s): PROBNP in the last 8760 hours. CBG: No results for input(s): GLUCAP in the last 168 hours.     Signed:  Beadie Matsunaga  [facility] 11/07/2014, 11:08 AM

## 2014-11-07 NOTE — Discharge Instructions (Signed)

## 2014-11-07 NOTE — Progress Notes (Signed)
Discharge instructions reviewed with pt. No current questions or concerns. Medication regimen reviewed with pt and pt verbalized understanding. Pt will be leaving shortly

## 2014-11-07 NOTE — Progress Notes (Signed)
Patient ID: Monique James, female   DOB: 1941/02/22, 74 y.o.   MRN: [id number]     Subjective:   No symptoms overnight   Objective:   Temp:  [97.8 F (36.6 C)-98.7 F (37.1 C)] 98.1 F (36.7 C) (01/06 0538) Pulse Rate:  [72-77] 77 (01/06 0538) Resp:  [15-21] 16 (01/06 0538) BP: (116-145)/(61-76) 145/76 mmHg (01/06 0538) SpO2:  [97 %-99 %] 97 % (01/06 0538) Last BM Date: 11/05/14  Filed Weights   11/05/14 0846 11/05/14 1354 11/06/14 0500  Weight: 200 lb (90.719 kg) 205 lb 0.4 oz (93 kg) 206 lb 5.6 oz (93.6 kg)    Intake/Output Summary (Last 24 hours) at 11/07/14 0839 Last data filed at 11/07/14 0538  Gross per 24 hour  Intake    240 ml  Output   1200 ml  Net   -960 ml    Telemetry: NSR  Exam:  General: NAD  Resp: CTAB  Cardiac: RRR, no m/r/g, no JVD  HX:TAVWPVX soft, NT, ND  MSK:no LE edema  Neuro: no focal deficits  Psych: appropriate affect  Lab Results:  Basic Metabolic Panel:  Recent Labs Lab 11/05/14 0904 11/06/14 0430  NA 134* 131*  K 4.2 3.5  CL 100 99  CO2 25 25  GLUCOSE 118* 108*  BUN 17 11  CREATININE 1.05 0.86  CALCIUM 9.9 8.6    Liver Function Tests:  Recent Labs Lab 11/05/14 0904  AST 21  ALT 18  ALKPHOS 57  BILITOT 0.4  PROT 7.1  ALBUMIN 4.1    CBC:  Recent Labs Lab 11/05/14 0904 11/06/14 0430 11/07/14 0603  WBC 12.0* 10.0 7.6  HGB 14.1 13.0 12.6  HCT 42.5 39.2 39.0  MCV 96.8 98.0 98.7  PLT 351 332 302    Cardiac Enzymes:  Recent Labs Lab 11/05/14 0904  TROPONINI 0.03    BNP: No results for input(s): PROBNP in the last 8760 hours.  Coagulation:  Recent Labs Lab 11/05/14 0904  INR 0.96    ECG:   Medications:   Scheduled Medications: . apixaban  5 mg Oral BID  . diltiazem  30 mg Oral 4 times per day  . metoprolol tartrate  25 mg Oral BID     Infusions:     PRN Medications:  ALPRAZolam     Assessment/Plan     1. Afib - new diagnosis this admission, rate controlled  with dilt and metoprolol. Eliquis started for stroke prevention, CHADS2Vasc score of 3 - will change to long acting dilt 180mg  daily, stop lopressor. Rates have been very well controlled, likely can control with single agent.  - echo normal LVEF 65-70%, indeterminant diastolic function  - ok for discharge from cardio standpoint, we will set up f/u appt in 2 weeks with NP Purcell Nails. Will give samples of eliquis 5mg  bid.     Carlyle Dolly, M.D.

## 2014-11-08 ENCOUNTER — Encounter (HOSPITAL_COMMUNITY): Payer: Self-pay | Admitting: Internal Medicine

## 2014-11-09 ENCOUNTER — Telehealth: Payer: Self-pay | Admitting: *Deleted

## 2014-11-09 NOTE — Telephone Encounter (Signed)
Pt has been having a small amount of bleeding in her gums and needs to know if she needs to be concerned, she is on eliquis.

## 2014-11-09 NOTE — Telephone Encounter (Signed)
Spoke with patient and noted she used dental floss to remove food stuck in rear molar and noted faint pink bleeding.She then used her water pick and has no sx's since.just needed reassurance

## 2014-11-23 ENCOUNTER — Encounter: Payer: Medicare Other | Admitting: Adult Health

## 2014-11-29 NOTE — Progress Notes (Signed)
HPI: this is a 74 year old patient of Dr. Harl Bowie, that we are seeing post hospitalization after admission for April for correlation with RVR.  The patient had complaints of chest pressure with lightheadedness and diaphoresis.  The patient was placed on a diltiazem drip.  The patient converted to normal sinus rhythm.  She was placed on oral diltiazem and oral metoprolol.  Her 2-D echocardiogram revealed an EF of 65-70%. CHADS VASC score 3.  She was started on Eliquis , with samples provided.  She is here for post hospitalization followup.  She is doing well, and has had no recurrences of rapid HR. She is tolerating the medication well, no bleeding issues. No dizziness or weakness. She has multiple questions.  Allergies  Allergen Reactions  . Bee Venom Anaphylaxis  . Epinephrine Other (See Comments)    High pulse rate.  . Sulfa Antibiotics Nausea And Vomiting  . Tetanus Toxoids Swelling    Entire arm swelled  . Vancomycin Nausea And Vomiting  . Adhesive [Tape] Rash    Her skin peels off.  . Influenza Vaccine Live Swelling and Rash  . Ivp Dye [Iodinated Diagnostic Agents] Rash  . Zyloprim [Allopurinol] Rash    Current Outpatient Prescriptions  Medication Sig Dispense Refill  . ALPRAZolam (XANAX) 0.25 MG tablet Take 1 tablet (0.25 mg total) by mouth 3 (three) times daily as needed for anxiety. 15 tablet 0  . apixaban (ELIQUIS) 5 MG TABS tablet Take 1 tablet (5 mg total) by mouth 2 (two) times daily. "Blood Thinner" 60 tablet 3  . Biotin 5000 MCG TABS Take 1 tablet by mouth daily.    . Calcium Carbonate-Vitamin D (CALCIUM 600+D) 600-400 MG-UNIT per tablet Take 1 tablet by mouth daily.     [name] Kitchen diltiazem (CARDIZEM CD) 180 MG 24 hr capsule Take 1 capsule (180 mg total) by mouth daily. Medication to control your heart rate. 30 capsule 3  . FIBER SELECT GUMMIES PO Take 1 each by mouth daily.    [name] Kitchen losartan (COZAAR) 25 MG tablet Take 1 tablet (25 mg total) by mouth daily. 30 tablet 3  . Multiple  Vitamins-Minerals (MULTIVITAMIN WITH MINERALS) tablet Take 1 tablet by mouth daily.       No current facility-administered medications for this visit.    Past Medical History  Diagnosis Date  . Hypertension   . Macular degeneration of right eye     Dr. Mikey Bussing (mild)--MISDIAGNOSED; 12/2013 told scarring, NOT macular degeneration  . Colon polyps     Dr. Oletta Lamas  . Osteopenia     mild  . Hyperlipidemia     elevated trigs and LDL  . Hx of pelvic mass     complex, benign pathology  . Hemorrhoids   . History of IBS   . Constipation   . Colon polyps     path was small leiomyoma  . Gout     history of (1985ish)  . Hyponatremia     mild  . 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 Shaune Leeks)  . Atrial fibrillation with rapid ventricular response 11/05/2014    New dx    Past Surgical History  Procedure Laterality Date  . Bilateral salpingoophorectomy  2005    benign tumor/cyst (Dr. Aldean Ast)  . Abdominal hysterectomy  1970    for perforated uterus from IUD    IAX:[id number] review of systems performed and found to be negative unless outlined above  PHYSICAL EXAM BP 138/82 mmHg  Pulse 96  Ht  5\' 5"  (1.651 m)  Wt 200 lb (90.719 kg)  BMI 33.28 kg/m2 General: Well developed, well nourished, in no acute distress Head: Eyes PERRLA, No xanthomas.   Normal cephalic and atramatic  Lungs: Clear bilaterally to auscultation and percussion. Heart: HRRR S1 S2, without MRG.  Pulses are 2+ & equal.            No carotid bruit. No JVD.  No abdominal bruits. No femoral bruits. Abdomen: Bowel sounds are positive, abdomen soft and non-tender without masses or                  Hernia's noted. Msk:  Back normal, normal gait. Normal strength and tone for age. Extremities: No clubbing, cyanosis or edema.  DP +1 Neuro: Alert and oriented X 3. Psych:  Good affect, responds appropriately  ASSESSMENT AND PLAN

## 2014-11-30 ENCOUNTER — Ambulatory Visit (INDEPENDENT_AMBULATORY_CARE_PROVIDER_SITE_OTHER): Payer: Medicare Other | Admitting: Adult Health

## 2014-11-30 ENCOUNTER — Encounter: Payer: Self-pay | Admitting: Adult Health

## 2014-11-30 VITALS — BP 138/82 | HR 96 | Ht 65.0 in | Wt 200.0 lb

## 2014-11-30 DIAGNOSIS — I4891 Unspecified atrial fibrillation: Secondary | ICD-10-CM | POA: Diagnosis not present

## 2014-11-30 NOTE — Progress Notes (Deleted)
Name: Monique James    DOB: 1940-11-27  Age: 74 y.o.  MR#: [id number]       PCP:  Vikki Ports, MD      Insurance: Payor: [employer] MEDICARE / Plan: [facility] MEDICARE / Product Type: *No Product type* /   CC:    Chief Complaint  Patient presents with  . Atrial Fibrillation  . Hypertension    VS Filed Vitals:   11/30/14 1319  BP: 138/82  Pulse: 96  Height: 5\' 5"  (1.651 m)  Weight: 200 lb (90.719 kg)    Weights Current Weight  11/30/14 200 lb (90.719 kg)  11/06/14 206 lb 5.6 oz (93.6 kg)  02/08/14 204 lb (92.534 kg)    Blood Pressure  BP Readings from Last 3 Encounters:  11/30/14 138/82  11/07/14 145/76  02/08/14 120/72     Admit date:  (Not on file) Last encounter with RMR:  Visit date not found   Allergy Bee venom; Epinephrine; Sulfa antibiotics; Tetanus toxoids; Vancomycin; Adhesive; Influenza vaccine live; Ivp dye; and Zyloprim  Current Outpatient Prescriptions  Medication Sig Dispense Refill  . ALPRAZolam (XANAX) 0.25 MG tablet Take 1 tablet (0.25 mg total) by mouth 3 (three) times daily as needed for anxiety. 15 tablet 0  . apixaban (ELIQUIS) 5 MG TABS tablet Take 1 tablet (5 mg total) by mouth 2 (two) times daily. "Blood Thinner" 60 tablet 3  . Biotin 5000 MCG TABS Take 1 tablet by mouth daily.    . Calcium Carbonate-Vitamin D (CALCIUM 600+D) 600-400 MG-UNIT per tablet Take 1 tablet by mouth daily.     [name] Kitchen diltiazem (CARDIZEM CD) 180 MG 24 hr capsule Take 1 capsule (180 mg total) by mouth daily. Medication to control your heart rate. 30 capsule 3  . FIBER SELECT GUMMIES PO Take 1 each by mouth daily.    [name] Kitchen losartan (COZAAR) 25 MG tablet Take 1 tablet (25 mg total) by mouth daily. 30 tablet 3  . Multiple Vitamins-Minerals (MULTIVITAMIN WITH MINERALS) tablet Take 1 tablet by mouth daily.       No current facility-administered medications for this visit.    Discontinued Meds:   There are no discontinued medications.  Patient Active Problem List   Diagnosis  Date Noted  . Atrial fibrillation with rapid ventricular response 11/05/2014  . Hypertension 11/05/2014  . Obesity 11/05/2014  . A-fib 11/05/2014  . Obesity (BMI 30-39.9) 02/08/2014  . Pure hypercholesterolemia 02/02/2013  . Essential hypertension, benign 07/30/2011  . Hyponatremia 07/30/2011    LABS    Component Value Date/Time   NA 131* 11/06/2014 0430   NA 134* 11/05/2014 0904   NA 133* 02/08/2014 1018   K 3.5 11/06/2014 0430   K 4.2 11/05/2014 0904   K 4.3 02/08/2014 1018   CL 99 11/06/2014 0430   CL 100 11/05/2014 0904   CL 100 02/08/2014 1018   CO2 25 11/06/2014 0430   CO2 25 11/05/2014 0904   CO2 24 02/08/2014 1018   GLUCOSE 108* 11/06/2014 0430   GLUCOSE 118* 11/05/2014 0904   GLUCOSE 98 02/08/2014 1018   BUN 11 11/06/2014 0430   BUN 17 11/05/2014 0904   BUN 17 02/08/2014 1018   CREATININE 0.86 11/06/2014 0430   CREATININE 1.05 11/05/2014 0904   CREATININE 0.83 02/08/2014 1018   CREATININE 0.83 02/02/2013 0915   CREATININE 0.80 02/04/2012 0944   CREATININE 0.79 09/16/2011 1230   CALCIUM 8.6 11/06/2014 0430   CALCIUM 9.9 11/05/2014 0904   CALCIUM 9.2 02/08/2014 1018  GFRNONAA 65* 11/06/2014 0430   GFRNONAA 51* 11/05/2014 0904   GFRNONAA 82* 09/16/2011 1230   GFRAA 75* 11/06/2014 0430   GFRAA 59* 11/05/2014 0904   GFRAA >90 09/16/2011 1230   CMP     Component Value Date/Time   NA 131* 11/06/2014 0430   K 3.5 11/06/2014 0430   CL 99 11/06/2014 0430   CO2 25 11/06/2014 0430   GLUCOSE 108* 11/06/2014 0430   BUN 11 11/06/2014 0430   CREATININE 0.86 11/06/2014 0430   CREATININE 0.83 02/08/2014 1018   CALCIUM 8.6 11/06/2014 0430   PROT 7.1 11/05/2014 0904   ALBUMIN 4.1 11/05/2014 0904   AST 21 11/05/2014 0904   ALT 18 11/05/2014 0904   ALKPHOS 57 11/05/2014 0904   BILITOT 0.4 11/05/2014 0904   GFRNONAA 65* 11/06/2014 0430   GFRAA 75* 11/06/2014 0430       Component Value Date/Time   WBC 7.6 11/07/2014 0603   WBC 10.0 11/06/2014 0430   WBC  12.0* 11/05/2014 0904   HGB 12.6 11/07/2014 0603   HGB 13.0 11/06/2014 0430   HGB 14.1 11/05/2014 0904   HCT 39.0 11/07/2014 0603   HCT 39.2 11/06/2014 0430   HCT 42.5 11/05/2014 0904   MCV 98.7 11/07/2014 0603   MCV 98.0 11/06/2014 0430   MCV 96.8 11/05/2014 0904    Lipid Panel     Component Value Date/Time   CHOL 223* 02/08/2014 1018   TRIG 96 02/08/2014 1018   HDL 81 02/08/2014 1018   CHOLHDL 2.8 02/08/2014 1018   VLDL 19 02/08/2014 1018   LDLCALC 123* 02/08/2014 1018    ABG No results found for: PHART, PCO2ART, PO2ART, HCO3, TCO2, ACIDBASEDEF, O2SAT   Lab Results  Component Value Date   TSH 2.721 11/05/2014   BNP (last 3 results) No results for input(s): PROBNP in the last 8760 hours. Cardiac Panel (last 3 results) No results for input(s): CKTOTAL, CKMB, TROPONINI, RELINDX in the last 72 hours.  Iron/TIBC/Ferritin/ %Sat No results found for: IRON, TIBC, FERRITIN, IRONPCTSAT   EKG Orders placed or performed during the hospital encounter of 11/05/14  . ED EKG  . ED EKG  . EKG 12-Lead  . EKG 12-Lead  . EKG 12-Lead  . EKG 12-Lead  . EKG 12-Lead  . EKG 12-Lead  . EKG     Prior Assessment and Plan Problem List as of 11/30/2014      Cardiovascular and Mediastinum   Hypertension   Essential hypertension, benign   Atrial fibrillation with rapid ventricular response   A-fib     Other   Obesity   Hyponatremia   Pure hypercholesterolemia   Obesity (BMI 30-39.9)       Imaging: Dg Chest Portable 1 View  11/05/2014   CLINICAL DATA:  Dizziness and chest pain.  Shortness of breath.  EXAM: PORTABLE CHEST - 1 VIEW  COMPARISON:  03/01/2009.  FINDINGS: Cardiomegaly. No infiltrates or failure. No effusion or pneumothorax.  IMPRESSION: Cardiomegaly.  No active disease.  Similar appearance to priors.   Electronically Signed   By: Rolla Flatten M.D.   On: 11/05/2014 09:33

## 2014-11-30 NOTE — Progress Notes (Deleted)
Name: Monique James    DOB: [birth year]  Age: 74 y.o.  MR#: [id number]       PCP:  Vikki Ports, MD      Insurance: Payor: [employer] MEDICARE / Plan: [facility] MEDICARE / Product Type: *No Product type* /   CC:    Chief Complaint  Patient presents with  . Atrial Fibrillation  . Hypertension    VS Filed Vitals:   11/30/14 1319  Pulse: 96  Height: 5\' 5"  (1.651 m)  Weight: 200 lb (90.719 kg)    Weights Current Weight  11/30/14 200 lb (90.719 kg)  11/06/14 206 lb 5.6 oz (93.6 kg)  02/08/14 204 lb (92.534 kg)    Blood Pressure  BP Readings from Last 3 Encounters:  11/07/14 145/76  02/08/14 120/72  08/09/13 144/80     Admit date:  (Not on file) Last encounter with RMR:  Visit date not found   Allergy Bee venom; Epinephrine; Sulfa antibiotics; Tetanus toxoids; Vancomycin; Adhesive; Influenza vaccine live; Ivp dye; and Zyloprim  Current Outpatient Prescriptions  Medication Sig Dispense Refill  . ALPRAZolam (XANAX) 0.25 MG tablet Take 1 tablet (0.25 mg total) by mouth 3 (three) times daily as needed for anxiety. 15 tablet 0  . apixaban (ELIQUIS) 5 MG TABS tablet Take 1 tablet (5 mg total) by mouth 2 (two) times daily. "Blood Thinner" 60 tablet 3  . Biotin 5000 MCG TABS Take 1 tablet by mouth daily.    . Calcium Carbonate-Vitamin D (CALCIUM 600+D) 600-400 MG-UNIT per tablet Take 1 tablet by mouth daily.     [name] Kitchen diltiazem (CARDIZEM CD) 180 MG 24 hr capsule Take 1 capsule (180 mg total) by mouth daily. Medication to control your heart rate. 30 capsule 3  . FIBER SELECT GUMMIES PO Take 1 each by mouth daily.    [name] Kitchen losartan (COZAAR) 25 MG tablet Take 1 tablet (25 mg total) by mouth daily. 30 tablet 3  . Multiple Vitamins-Minerals (MULTIVITAMIN WITH MINERALS) tablet Take 1 tablet by mouth daily.       No current facility-administered medications for this visit.    Discontinued Meds:   There are no discontinued medications.  Patient Active Problem List   Diagnosis Date Noted  .  Atrial fibrillation with rapid ventricular response 11/05/2014  . Hypertension 11/05/2014  . Obesity 11/05/2014  . A-fib 11/05/2014  . Obesity (BMI 30-39.9) 02/08/2014  . Pure hypercholesterolemia 02/02/2013  . Essential hypertension, benign 07/30/2011  . Hyponatremia 07/30/2011    LABS    Component Value Date/Time   NA 131* 11/06/2014 0430   NA 134* 11/05/2014 0904   NA 133* 02/08/2014 1018   K 3.5 11/06/2014 0430   K 4.2 11/05/2014 0904   K 4.3 02/08/2014 1018   CL 99 11/06/2014 0430   CL 100 11/05/2014 0904   CL 100 02/08/2014 1018   CO2 25 11/06/2014 0430   CO2 25 11/05/2014 0904   CO2 24 02/08/2014 1018   GLUCOSE 108* 11/06/2014 0430   GLUCOSE 118* 11/05/2014 0904   GLUCOSE 98 02/08/2014 1018   BUN 11 11/06/2014 0430   BUN 17 11/05/2014 0904   BUN 17 02/08/2014 1018   CREATININE 0.86 11/06/2014 0430   CREATININE 1.05 11/05/2014 0904   CREATININE 0.83 02/08/2014 1018   CREATININE 0.83 02/02/2013 0915   CREATININE 0.80 02/04/2012 0944   CREATININE 0.79 09/16/2011 1230   CALCIUM 8.6 11/06/2014 0430   CALCIUM 9.9 11/05/2014 0904   CALCIUM 9.2 02/08/2014 1018   GFRNONAA 65*  11/06/2014 0430   GFRNONAA 51* 11/05/2014 0904   GFRNONAA 82* 09/16/2011 1230   GFRAA 75* 11/06/2014 0430   GFRAA 59* 11/05/2014 0904   GFRAA >90 09/16/2011 1230   CMP     Component Value Date/Time   NA 131* 11/06/2014 0430   K 3.5 11/06/2014 0430   CL 99 11/06/2014 0430   CO2 25 11/06/2014 0430   GLUCOSE 108* 11/06/2014 0430   BUN 11 11/06/2014 0430   CREATININE 0.86 11/06/2014 0430   CREATININE 0.83 02/08/2014 1018   CALCIUM 8.6 11/06/2014 0430   PROT 7.1 11/05/2014 0904   ALBUMIN 4.1 11/05/2014 0904   AST 21 11/05/2014 0904   ALT 18 11/05/2014 0904   ALKPHOS 57 11/05/2014 0904   BILITOT 0.4 11/05/2014 0904   GFRNONAA 65* 11/06/2014 0430   GFRAA 75* 11/06/2014 0430       Component Value Date/Time   WBC 7.6 11/07/2014 0603   WBC 10.0 11/06/2014 0430   WBC 12.0* 11/05/2014  0904   HGB 12.6 11/07/2014 0603   HGB 13.0 11/06/2014 0430   HGB 14.1 11/05/2014 0904   HCT 39.0 11/07/2014 0603   HCT 39.2 11/06/2014 0430   HCT 42.5 11/05/2014 0904   MCV 98.7 11/07/2014 0603   MCV 98.0 11/06/2014 0430   MCV 96.8 11/05/2014 0904    Lipid Panel     Component Value Date/Time   CHOL 223* 02/08/2014 1018   TRIG 96 02/08/2014 1018   HDL 81 02/08/2014 1018   CHOLHDL 2.8 02/08/2014 1018   VLDL 19 02/08/2014 1018   LDLCALC 123* 02/08/2014 1018    ABG No results found for: PHART, PCO2ART, PO2ART, HCO3, TCO2, ACIDBASEDEF, O2SAT   Lab Results  Component Value Date   TSH 2.721 11/05/2014   BNP (last 3 results) No results for input(s): PROBNP in the last 8760 hours. Cardiac Panel (last 3 results) No results for input(s): CKTOTAL, CKMB, TROPONINI, RELINDX in the last 72 hours.  Iron/TIBC/Ferritin/ %Sat No results found for: IRON, TIBC, FERRITIN, IRONPCTSAT   EKG Orders placed or performed during the hospital encounter of 11/05/14  . ED EKG  . ED EKG  . EKG 12-Lead  . EKG 12-Lead  . EKG 12-Lead  . EKG 12-Lead  . EKG 12-Lead  . EKG 12-Lead  . EKG     Prior Assessment and Plan Problem List as of 11/30/2014      Cardiovascular and Mediastinum   Hypertension   Essential hypertension, benign   Atrial fibrillation with rapid ventricular response   A-fib     Other   Obesity   Hyponatremia   Pure hypercholesterolemia   Obesity (BMI 30-39.9)       Imaging: Dg Chest Portable 1 View  11/05/2014   CLINICAL DATA:  Dizziness and chest pain.  Shortness of breath.  EXAM: PORTABLE CHEST - 1 VIEW  COMPARISON:  03/01/2009.  FINDINGS: Cardiomegaly. No infiltrates or failure. No effusion or pneumothorax.  IMPRESSION: Cardiomegaly.  No active disease.  Similar appearance to priors.   Electronically Signed   By: Rolla Flatten M.D.   On: 11/05/2014 09:33

## 2014-11-30 NOTE — Patient Instructions (Signed)
Your physician wants you to follow-up in: 6 months with K Lawrence NP You will receive a reminder letter in the mail two months in advance. If you don't receive a letter, please call our office to schedule the follow-up appointment.    Your physician recommends that you continue on your current medications as directed. Please refer to the Current Medication list given to you today.    Thank you for choosing [facility] !        

## 2014-11-30 NOTE — Assessment & Plan Note (Signed)
She remains in normal sinus rhythm and is asymptomatic concerning side effects of medications. She will stay on Eliquis as directed, CHADS Score of 3. Will continue current regimen. She will be seen again in 6 months unless symptomatic. She is to avoid caffeine.

## 2014-12-03 ENCOUNTER — Telehealth: Payer: Self-pay | Admitting: *Deleted

## 2014-12-03 NOTE — Telephone Encounter (Signed)
ID # [id number]    Appoved thru 11/2015   PA # [id number]   [facility] notified of approval ,I spoke with Richardson Landry   Pt notified by [facility]

## 2014-12-03 NOTE — Telephone Encounter (Signed)
Pt needs authorization for eliquis [id number]

## 2014-12-19 ENCOUNTER — Telehealth: Payer: Self-pay | Admitting: Adult Health

## 2014-12-19 NOTE — Telephone Encounter (Signed)
Patient wants to know if she can take Tylenol Arthritis with all her other medications. / tgs

## 2014-12-20 NOTE — Telephone Encounter (Signed)
Reassured pt tylenol was safe to use

## 2014-12-25 DIAGNOSIS — D485 Neoplasm of uncertain behavior of skin: Secondary | ICD-10-CM | POA: Diagnosis not present

## 2014-12-25 DIAGNOSIS — Z1283 Encounter for screening for malignant neoplasm of skin: Secondary | ICD-10-CM | POA: Diagnosis not present

## 2014-12-25 DIAGNOSIS — D225 Melanocytic nevi of trunk: Secondary | ICD-10-CM | POA: Diagnosis not present

## 2014-12-27 DIAGNOSIS — T63441A Toxic effect of venom of bees, accidental (unintentional), initial encounter: Secondary | ICD-10-CM | POA: Diagnosis not present

## 2015-02-01 DIAGNOSIS — B029 Zoster without complications: Secondary | ICD-10-CM

## 2015-02-01 HISTORY — DX: Zoster without complications: B02.9

## 2015-02-11 DIAGNOSIS — D485 Neoplasm of uncertain behavior of skin: Secondary | ICD-10-CM | POA: Diagnosis not present

## 2015-02-11 DIAGNOSIS — D2262 Melanocytic nevi of left upper limb, including shoulder: Secondary | ICD-10-CM | POA: Diagnosis not present

## 2015-02-11 DIAGNOSIS — B029 Zoster without complications: Secondary | ICD-10-CM | POA: Diagnosis not present

## 2015-02-12 DIAGNOSIS — H52223 Regular astigmatism, bilateral: Secondary | ICD-10-CM | POA: Diagnosis not present

## 2015-02-12 DIAGNOSIS — H524 Presbyopia: Secondary | ICD-10-CM | POA: Diagnosis not present

## 2015-02-12 DIAGNOSIS — H5203 Hypermetropia, bilateral: Secondary | ICD-10-CM | POA: Diagnosis not present

## 2015-02-12 DIAGNOSIS — H31011 Macula scars of posterior pole (postinflammatory) (post-traumatic), right eye: Secondary | ICD-10-CM | POA: Diagnosis not present

## 2015-02-14 ENCOUNTER — Encounter: Payer: Self-pay | Admitting: Family Medicine

## 2015-02-14 ENCOUNTER — Ambulatory Visit (INDEPENDENT_AMBULATORY_CARE_PROVIDER_SITE_OTHER): Payer: Medicare Other | Admitting: Family Medicine

## 2015-02-14 VITALS — BP 142/82 | HR 80 | Ht 63.5 in | Wt 199.8 lb

## 2015-02-14 DIAGNOSIS — Z0000 Encounter for general adult medical examination without abnormal findings: Secondary | ICD-10-CM | POA: Diagnosis not present

## 2015-02-14 DIAGNOSIS — I10 Essential (primary) hypertension: Secondary | ICD-10-CM

## 2015-02-14 DIAGNOSIS — Z7901 Long term (current) use of anticoagulants: Secondary | ICD-10-CM | POA: Diagnosis not present

## 2015-02-14 DIAGNOSIS — B029 Zoster without complications: Secondary | ICD-10-CM

## 2015-02-14 DIAGNOSIS — M8580 Other specified disorders of bone density and structure, unspecified site: Secondary | ICD-10-CM

## 2015-02-14 DIAGNOSIS — I480 Paroxysmal atrial fibrillation: Secondary | ICD-10-CM | POA: Diagnosis not present

## 2015-02-14 DIAGNOSIS — E871 Hypo-osmolality and hyponatremia: Secondary | ICD-10-CM | POA: Diagnosis not present

## 2015-02-14 DIAGNOSIS — E559 Vitamin D deficiency, unspecified: Secondary | ICD-10-CM | POA: Diagnosis not present

## 2015-02-14 DIAGNOSIS — E7800 Pure hypercholesterolemia, unspecified: Secondary | ICD-10-CM

## 2015-02-14 LAB — CBC WITH DIFFERENTIAL/PLATELET
Basophils Absolute: 0 10*3/uL (ref 0.0–0.1)
Basophils Relative: 0 % (ref 0–1)
EOS PCT: 2 % (ref 0–5)
Eosinophils Absolute: 0.2 10*3/uL (ref 0.0–0.7)
HEMATOCRIT: 43.2 % (ref 36.0–46.0)
HEMOGLOBIN: 14.8 g/dL (ref 12.0–15.0)
LYMPHS ABS: 2.8 10*3/uL (ref 0.7–4.0)
LYMPHS PCT: 37 % (ref 12–46)
MCH: 31.4 pg (ref 26.0–34.0)
MCHC: 34.3 g/dL (ref 30.0–36.0)
MCV: 91.7 fL (ref 78.0–100.0)
MONOS PCT: 9 % (ref 3–12)
MPV: 9.9 fL (ref 8.6–12.4)
Monocytes Absolute: 0.7 10*3/uL (ref 0.1–1.0)
Neutro Abs: 3.9 10*3/uL (ref 1.7–7.7)
Neutrophils Relative %: 52 % (ref 43–77)
Platelets: 317 10*3/uL (ref 150–400)
RBC: 4.71 MIL/uL (ref 3.87–5.11)
RDW: 13.7 % (ref 11.5–15.5)
WBC: 7.5 10*3/uL (ref 4.0–10.5)

## 2015-02-14 LAB — POCT URINALYSIS DIPSTICK
BILIRUBIN UA: NEGATIVE
Glucose, UA: NEGATIVE
KETONES UA: NEGATIVE
LEUKOCYTES UA: NEGATIVE
Nitrite, UA: NEGATIVE
Protein, UA: NEGATIVE
Spec Grav, UA: 1.015
Urobilinogen, UA: NEGATIVE
pH, UA: 6.5

## 2015-02-14 NOTE — Progress Notes (Signed)
Chief Complaint  Patient presents with  . Annual Exam    fasting annual exam with pelvic. UA showed trace blood, patient is asymptomatic. Did not do eye exam-she had appt this past Tues at [facility] in [place]. No concerns. Diagnosed with Shingles by Dr.Hall this past Monday.    Monique James is a 74 y.o. female who presents for a complete physical.  She has the following concerns:  Atrial fibrillation--diagnosed in January (hospitalized).  She was started on Eliquis, as well as Cardizem (and losartan dose was decreased), and has seen cardiologist in f/u.  She was in NSR at time of visit (1/29) and is due to f/u in June/July.  She hasn't had any symptoms since then--no tachycardia, palpitations, chest pain, shortness of breath.  Her symptoms had been diaphoresis when P about 200, with some dyspnea.  She denies any bleeding, bruising or other problems.  Shingles--behind her left ear, on neck and on her left shoulder.  She was diagnosed by Dr. Nevada Crane earlier this week, and is now on Valtrex.  She has some discomfort behind her left ear, intermittent, sometimes shooting.  She has been taking tylenol (didn't have anything stronger), but pain seems to be diminishing.  Hypertension follow-up: Blood pressures elsewhere are 130-135/70's.Pulse 80-88. Med changes as mentioned above. Denies dizziness, headaches, chest pain. Denies side effects of medications.  Hyperlipidemia: Diet remains the same--she has continued to limit her cheese intake, continuing to eat more fruits and vegetables. She cut out the creamy dressings. She mostly eats chicken and some pork, only has a burger on the grill once a month, if even that often. Has tuna without mayonnaise (uses vinegar). She reports she has lost 7# since hospital discharge.  Other doctors caring for patient include: Dentist: Dr. Truman Hayward Ophtho: Dr. Jorja Loa Allergist: Dr. Shaune Leeks GI: Dr. Oletta Lamas Cardiologist: Dr. Harl Bowie Dermatologist: Dr. Nevada Crane  End  of Life discussion: She has filled out Living Will and healthcare power of attorney--still hasn't gotten it notarized yet.   There is no immunization history on file for this patient.  Allergic to tetanus, flu shots. She refuses pneumovax, prevnar and zostavax  Last Pap smear: n/a, s/p hysterectomy  Last mammogram: 07/2014 Last colonoscopy: 04/2012 due again in 2018  Last D[facility]A: 01/2014 at [facility]  T-1.3 Ophtho: yearly, seen this week (02/2015) Dentist: twice yearly  Exercise: aerobics 3x/week ,some with weights, plus walking H/o low vitamin D in the past. Last checked in 2012 and was normal. She remains on same supplementation  Past Medical History  Diagnosis Date  . Hypertension   . Macular degeneration of right eye     Dr. Mikey Bussing (mild)--MISDIAGNOSED; 12/2013 told scarring, NOT macular degeneration  . Colon polyps     Dr. Oletta Lamas  . Osteopenia     mild (T-1.3 in 01/2014)  . Hyperlipidemia     elevated trigs and LDL  . Hx of pelvic mass     complex, benign pathology  . Hemorrhoids   . History of IBS   . Constipation   . Colon polyps     path was small leiomyoma  . Gout     history of (1985ish)  . Hyponatremia     mild  . 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 Shaune Leeks)  . Atrial fibrillation with rapid ventricular response 11/05/2014    New dx  . Shingles 02/2015    Past Surgical History  Procedure Laterality Date  . Bilateral salpingoophorectomy  2005  benign tumor/cyst (Dr. Aldean Ast)  . Abdominal hysterectomy  1970    for perforated uterus from IUD    History   Social History  . Marital Status: Widowed    Spouse Name: N/A  . Number of Children: 3  . Years of Education: N/A   Occupational History  . retired Therapist, sports    Social History Main Topics  . Smoking status: Never Smoker   . Smokeless tobacco: Never Used  . Alcohol Use: 8.4 oz/week    14 Glasses of wine per week     Comment: 1 glass of wine daily, occasionally 2  . Drug Use: No  .  Sexual Activity: Not Currently   Other Topics Concern  . Not on file   Social History Narrative   Lives alone, 2 dogs;  Daughter lives nearby.  3 grandchildren.  Other children in [place]    Family History  Problem Relation Age of Onset  . Hypertension Mother   . Heart disease Mother     MI  . Dementia Mother   . Cancer Father     stomach  . Cancer Brother     multiple myeloma  . Diabetes Neg Hx   . Breast cancer Neg Hx   . Colon cancer Neg Hx   . Healthy Daughter   . Healthy Daughter   . Healthy Daughter     Outpatient Encounter Prescriptions as of 02/14/2015  Medication Sig Note  . apixaban (ELIQUIS) 5 MG TABS tablet Take 1 tablet (5 mg total) by mouth 2 (two) times daily. "Blood Thinner"   . Biotin 5000 MCG TABS Take 1 tablet by mouth daily.   . Calcium Carbonate-Vitamin D (CALCIUM 600+D) 600-400 MG-UNIT per tablet Take 1 tablet by mouth daily.    [name] Kitchen diltiazem (CARDIZEM CD) 180 MG 24 hr capsule Take 1 capsule (180 mg total) by mouth daily. Medication to control your heart rate.   [name] Kitchen FIBER SELECT GUMMIES PO Take 1 each by mouth daily.   [name] Kitchen losartan (COZAAR) 25 MG tablet Take 1 tablet (25 mg total) by mouth daily.   . Multiple Vitamins-Minerals (MULTIVITAMIN WITH MINERALS) tablet Take 1 tablet by mouth daily.     . valACYclovir (VALTREX) 1000 MG tablet Take 1,000 mg by mouth 3 (three) times daily.  02/14/2015: Started 3 days ago for shingles from dermatologist  . ALPRAZolam (XANAX) 0.25 MG tablet Take 1 tablet (0.25 mg total) by mouth 3 (three) times daily as needed for anxiety. (Patient not taking: Reported on 02/14/2015)     Allergies  Allergen Reactions  . Bee Venom Anaphylaxis  . Epinephrine Other (See Comments)    High pulse rate.  . Sulfa Antibiotics Nausea And Vomiting  . Tetanus Toxoids Swelling    Entire arm swelled  . Vancomycin Nausea And Vomiting  . Adhesive [Tape] Rash    Her skin peels off.  . Influenza Vaccine Live Swelling and Rash  . Ivp  Dye [Iodinated Diagnostic Agents] Rash  . Zyloprim [Allopurinol] Rash   ROS: The patient denies anorexia, fever, headaches, vision changes, decreased hearing, ear pain, sore throat, breast concerns, chest pain, palpitations, dizziness, syncope, dyspnea on exertion, cough, swelling, nausea, vomiting, diarrhea,abdominal pain, melena, hematochezia, hematuria, incontinence, dysuria, vaginal bleeding, discharge, odor or itch, genital lesions, numbness, tingling, weakness, tremor, suspicious skin lesions, depression, anxiety, abnormal bleeding/bruising, or enlarged lymph nodes (has one behind her left ear currently, improving as shingles outbreak is drying up).  +arthritis in hands and R knee, no  significant pain, but notices with weather changes. Occasional heartburn with fatty foods (rare, relieved by Tums). Some mild constipation, controlled with high fiber diet and stool softeners. 5# weight loss since CPE last year   PHYSICAL EXAM:  BP 154/84 mmHg  Pulse 80  Ht 5' 3.5" (1.613 m)  Wt 199 lb 12.8 oz (90.629 kg)  BMI 34.83 kg/m2 142/82 on repeat by MD  General Appearance:  Alert, cooperative, no distress, appears stated age   Head:  Normocephalic, without obvious abnormality, atraumatic   Eyes:  PERRL, conjunctiva/corneas clear, EOM's intact, fundi  benign   Ears:  Normal TM's and external ear canals   Nose:  Nares normal, mucosa normal, no drainage or sinus tenderness   Throat:  Lips, mucosa, and tongue normal; teeth and gums normal   Neck:  Supple, no lymphadenopathy; thyroid: no enlargement/tenderness/nodules; no carotid  bruit or JVD   Back:  Spine nontender, no curvature, ROM normal, no CVA tenderness   Lungs:  Clear to auscultation bilaterally without wheezes, rales or ronchi; respirations unlabored   Chest Wall:  No tenderness or deformity   Heart:  Regular rate and rhythm, S1 and S2 normal, no murmur, rub  or gallop   Breast Exam:  No tenderness, masses,  or nipple discharge or inversion. No axillary lymphadenopathy   Abdomen:  Soft, non-tender, nondistended, normoactive bowel sounds,  no masses, no hepatosplenomegaly   Genitalia:  Normal external genitalia without lesions, mild atrophic changes. BUS and vagina normal; No abnormal vaginal discharge. Bimanual exam was normal, without any masses appreciable, nontender   Rectal:  Normal tone, no masses or tenderness; guaiac negative stool   Extremities:  No clubbing, cyanosis or edema   Pulses:  2+ and symmetric all extremities   Skin:  Skin color, texture, turgor normal.  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 It measures 6 x 4.5 mm (to a little over 6 including the bottom "trunk" of tree) mm, The edges seem flat, but the central portion is mildly raised (and rounder, like a typical angioma)). L anterior shoulder--erythema with vesicles in a patch Small patch below left ear and at the base of her neck centrally.  Lymph nodes:  Cervical, supraclavicular, and axillary nodes normal   Neurologic:  CNII-XII intact, normal strength, sensation and gait; reflexes 2+ and symmetric throughout   Psych: Normal mood, affect, hygiene and grooming.         ASSESSMENT/PLAN:  Annual physical exam - Plan: POCT Urinalysis Dipstick  Pure hypercholesterolemia - has been controlled with diet.  due for recheck today - Plan: Lipid panel  Hyponatremia - recheck today, has been chronic and mild  Essential hypertension, benign - higher in office today, at goal at home.  continue current medications - Plan: Comprehensive metabolic panel  Paroxysmal atrial fibrillation - doing well on Eliquis.  in NSR.  f/u with cardiologist as scheduled  Long term current use of anticoagulant therapy - Plan: CBC with Differential/Platelet  Osteopenia - mild.  reviewed calcium  and vitamin D recommendations, weight-bearing exercise. - Plan: Vit D  25 hydroxy (rtn osteoporosis monitoring)  Vitamin D deficiency - Plan: Vit D  25 hydroxy (rtn osteoporosis monitoring)  Shingles - complete course of Valtrex.  normal course reviewed. pain tolerable   Discussed monthly self breast exams and yearly mammograms; at least 30 minutes of aerobic activity at least 5 days/week, weight-bearing exercise  2-3x/wk; proper sunscreen use reviewed; healthy diet, including goals of calcium and vitamin D intake and alcohol recommendations (less than or equal to 1 drink/day) reviewed; regular seatbelt use; changing batteries in smoke detectors. Immunization recommendations discussed--refuses zostavax and pneumovax (due to allergies to other vaccines). I strongly encouraged that she consider Prevnar--maybe to discuss and get at her allergist's office. Colonoscopy recommendations reviewed, UTD.   Reminded to get forms notarized, and will drop off copy for her chart here.  F/u 1 year, sooner prn. F/u with cardiologist as scheduled

## 2015-02-14 NOTE — Patient Instructions (Signed)
  HEALTH MAINTENANCE RECOMMENDATIONS:  It is recommended that you get at least 30 minutes of aerobic exercise at least 5 days/week (for weight loss, you may need as much as 60-90 minutes). This can be any activity that gets your heart rate up. This can be divided in 10-15 minute intervals if needed, but try and build up your endurance at least once a week.  Weight bearing exercise is also recommended twice weekly.  Eat a healthy diet with lots of vegetables, fruits and fiber.  "Colorful" foods have a lot of vitamins (ie green vegetables, tomatoes, red peppers, etc).  Limit sweet tea, regular sodas and alcoholic beverages, all of which has a lot of calories and sugar.  Up to 1 alcoholic drink daily may be beneficial for women (unless trying to lose weight, watch sugars).  Drink a lot of water.  Calcium recommendations are 1200-1500 mg daily (1500 mg for postmenopausal women or women without ovaries), and vitamin D 1000 IU daily.  This should be obtained from diet and/or supplements (vitamins), and calcium should not be taken all at once, but in divided doses.  Monthly self breast exams and yearly mammograms for women over the age of 3 is recommended.  Sunscreen of at least SPF 30 should be used on all sun-exposed parts of the skin when outside between the hours of 10 am and 4 pm (not just when at beach or pool, but even with exercise, golf, tennis, and yard work!)  Use a sunscreen that says "broad spectrum" so it covers both UVA and UVB rays, and make sure to reapply every 1-2 hours.  Remember to change the batteries in your smoke detectors when changing your clock times in the spring and fall.  Use your seat belt every time you are in a car, and please drive safely and not be distracted with cell phones and texting while driving.  Please remember to get your Living Will and healthcare power of attorney notarized and send [place] a copy to scan into your chart.  Consider getting Prevnar (a type of  pneumonia vaccine)--I strongly encourage this.  Consider getting through your allergist's office if you are concerned about a reaction.

## 2015-02-15 LAB — LIPID PANEL
CHOL/HDL RATIO: 2.7 ratio
CHOLESTEROL: 230 mg/dL — AB (ref 0–200)
HDL: 84 mg/dL (ref >=46)
LDL Cholesterol: 126 mg/dL — ABNORMAL HIGH (ref 0–99)
TRIGLYCERIDES: 101 mg/dL (ref <150)
VLDL: 20 mg/dL (ref 0–40)

## 2015-02-15 LAB — COMPREHENSIVE METABOLIC PANEL
ALBUMIN: 4.3 g/dL (ref 3.5–5.2)
ALK PHOS: 64 U/L (ref 39–117)
ALT: 18 U/L (ref 0–35)
AST: 15 U/L (ref 0–37)
BILIRUBIN TOTAL: 0.4 mg/dL (ref 0.2–1.2)
BUN: 10 mg/dL (ref 6–23)
CO2: 26 mEq/L (ref 19–32)
Calcium: 10.1 mg/dL (ref 8.4–10.5)
Chloride: 94 mEq/L — ABNORMAL LOW (ref 96–112)
Creat: 0.76 mg/dL (ref 0.50–1.10)
GLUCOSE: 93 mg/dL (ref 70–99)
POTASSIUM: 4.4 meq/L (ref 3.5–5.3)
Sodium: 130 mEq/L — ABNORMAL LOW (ref 135–145)
TOTAL PROTEIN: 7.3 g/dL (ref 6.0–8.3)

## 2015-02-15 LAB — VITAMIN D 25 HYDROXY (VIT D DEFICIENCY, FRACTURES): Vit D, 25-Hydroxy: 29 ng/mL — ABNORMAL LOW (ref 30–100)

## 2015-02-21 DIAGNOSIS — T63441A Toxic effect of venom of bees, accidental (unintentional), initial encounter: Secondary | ICD-10-CM | POA: Diagnosis not present

## 2015-03-04 ENCOUNTER — Telehealth: Payer: Self-pay | Admitting: *Deleted

## 2015-03-04 MED ORDER — DILTIAZEM HCL ER COATED BEADS 180 MG PO CP24: 180.0000 mg | ORAL_CAPSULE | Freq: Every day | ORAL | Status: DC

## 2015-03-04 MED ORDER — LOSARTAN POTASSIUM 25 MG PO TABS: 25.0000 mg | ORAL_TABLET | Freq: Every day | ORAL | Status: DC

## 2015-03-04 NOTE — Telephone Encounter (Signed)
losartan (COZAAR) 25 MG tablet diltiazem (CARDIZEM CD) 180 MG 24 hr capsule  [facility] in [place]

## 2015-03-27 ENCOUNTER — Other Ambulatory Visit: Payer: Self-pay | Admitting: *Deleted

## 2015-03-27 MED ORDER — APIXABAN 5 MG PO TABS: 5.0000 mg | ORAL_TABLET | Freq: Two times a day (BID) | ORAL | Status: DC

## 2015-04-19 DIAGNOSIS — T63441A Toxic effect of venom of bees, accidental (unintentional), initial encounter: Secondary | ICD-10-CM | POA: Diagnosis not present

## 2015-05-13 DIAGNOSIS — D485 Neoplasm of uncertain behavior of skin: Secondary | ICD-10-CM | POA: Diagnosis not present

## 2015-05-13 DIAGNOSIS — D225 Melanocytic nevi of trunk: Secondary | ICD-10-CM | POA: Diagnosis not present

## 2015-05-20 DIAGNOSIS — T7840XA Allergy, unspecified, initial encounter: Secondary | ICD-10-CM | POA: Diagnosis not present

## 2015-05-30 ENCOUNTER — Ambulatory Visit (INDEPENDENT_AMBULATORY_CARE_PROVIDER_SITE_OTHER): Payer: Medicare Other | Admitting: Adult Health

## 2015-05-30 ENCOUNTER — Encounter: Payer: Self-pay | Admitting: Adult Health

## 2015-05-30 VITALS — BP 140/88 | HR 93 | Ht 65.0 in | Wt 197.0 lb

## 2015-05-30 DIAGNOSIS — I10 Essential (primary) hypertension: Secondary | ICD-10-CM

## 2015-05-30 DIAGNOSIS — I480 Paroxysmal atrial fibrillation: Secondary | ICD-10-CM | POA: Diagnosis not present

## 2015-05-30 NOTE — Progress Notes (Signed)
Cardiology Office Note   Date:  05/30/2015   ID:  Monique James, DOB 1941-02-23, MRN [id number]  PCP:  Vikki Ports, MD  Cardiologist:  Cloria Spring, NP   Chief Complaint  Patient presents with  . Atrial Fibrillation  . Hypertension      History of Present Illness: Monique James is a 74 y.o. female who presents for ongoing assessment and management of atrial fibrillation.  The patient was originally seen in April 2015 in the hospital in the setting A. Fib RVR.  She was last in the office in January 2016, was doing well without any complaints. She has CHADS VASC Score of 3 and is on Eliquis  She comes today completely asymptomatic. No issues with bleeding, palpitations or chest pain. She is medically compliant. She states that she is waiting for [facility] ot refill her Eliquis. They had trouble stocking it. She has seen her PCP in April and has had labs drawn.   Past Medical History  Diagnosis Date  . Hypertension   . Macular degeneration of right eye     Dr. Mikey Bussing (mild)--MISDIAGNOSED; 12/2013 told scarring, NOT macular degeneration  . Colon polyps     Dr. Oletta Lamas  . Osteopenia     mild (T-1.3 in 01/2014)  . Hyperlipidemia     elevated trigs and LDL  . Hx of pelvic mass     complex, benign pathology  . Hemorrhoids   . History of IBS   . Constipation   . Colon polyps     path was small leiomyoma  . Gout     history of (1985ish)  . Hyponatremia     mild  . 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 Shaune Leeks)  . Atrial fibrillation with rapid ventricular response 11/05/2014    New dx  . Shingles 02/2015    Past Surgical History  Procedure Laterality Date  . Bilateral salpingoophorectomy  2005    benign tumor/cyst (Dr. Aldean Ast)  . Abdominal hysterectomy  1970    for perforated uterus from IUD     Current Outpatient Prescriptions  Medication Sig Dispense Refill  . ALPRAZolam (XANAX) 0.25 MG tablet Take 1 tablet (0.25 mg total) by mouth 3 (three)  times daily as needed for anxiety. 15 tablet 0  . apixaban (ELIQUIS) 5 MG TABS tablet Take 1 tablet (5 mg total) by mouth 2 (two) times daily. "Blood Thinner" 60 tablet 6  . Biotin 5000 MCG TABS Take 1 tablet by mouth daily.    . Calcium Carbonate-Vitamin D (CALCIUM 600+D) 600-400 MG-UNIT per tablet Take 1 tablet by mouth daily.     [name] Kitchen diltiazem (CARDIZEM CD) 180 MG 24 hr capsule Take 1 capsule (180 mg total) by mouth daily. Medication to control your heart rate. 90 capsule 3  . FIBER SELECT GUMMIES PO Take 1 each by mouth daily.    [name] Kitchen losartan (COZAAR) 25 MG tablet Take 1 tablet (25 mg total) by mouth daily. 90 tablet 3  . Multiple Vitamins-Minerals (MULTIVITAMIN WITH MINERALS) tablet Take 1 tablet by mouth daily.      . valACYclovir (VALTREX) 1000 MG tablet Take 1,000 mg by mouth 3 (three) times daily.      No current facility-administered medications for this visit.    Allergies:   Bee venom; Epinephrine; Sulfa antibiotics; Tetanus toxoids; Vancomycin; Adhesive; Influenza vaccine live; Ivp dye; and Zyloprim    Social History:  The patient  reports that she has never smoked. She has  never used smokeless tobacco. She reports that she drinks about 8.4 oz of alcohol per week. She reports that she does not use illicit drugs.   Family History:  The patient's family history includes Cancer in her brother and father; Dementia in her mother; Healthy in her daughter, daughter, and daughter; Heart disease in her mother; Hypertension in her mother. There is no history of Diabetes, Breast cancer, or Colon cancer.    ROS: .   All other systems are reviewed and negative.Unless otherwise mentioned in  H&P above.   PHYSICAL EXAM: VS:  BP 140/88 mmHg  Pulse 93  Ht 5\' 5"  (1.651 m)  Wt 197 lb (89.359 kg)  BMI 32.78 kg/m2  SpO2 97% , BMI Body mass index is 32.78 kg/(m^2). GEN: Well nourished, well developed, in no acute distress HEENT: normal Neck: no JVD, carotid bruits, or masses Cardiac: IRRR  (regularly irregular); no murmurs, rubs, or gallops,no edema  Respiratory:  clear to auscultation bilaterally, normal work of breathing GI: soft, nontender, nondistended, + BS MS: no deformity or atrophy Skin: warm and dry, no rash Neuro:  Strength and sensation are intact Psych: euthymic mood, full affect  Recent Labs: 11/05/2014: TSH 2.721 02/14/2015: ALT 18; BUN 10; Creat 0.76; Hemoglobin 14.8; Platelets 317; Potassium 4.4; Sodium 130*    Lipid Panel    Component Value Date/Time   CHOL 230* 02/14/2015 0001   TRIG 101 02/14/2015 0001   HDL 84 02/14/2015 0001   CHOLHDL 2.7 02/14/2015 0001   VLDL 20 02/14/2015 0001   LDLCALC 126* 02/14/2015 0001      Wt Readings from Last 3 Encounters:  05/30/15 197 lb (89.359 kg)  02/14/15 199 lb 12.8 oz (90.629 kg)  11/30/14 200 lb (90.719 kg)      Other studies Reviewed: Additional studies/ records that were reviewed today include: Labs from April 2016.  Review of the above records demonstrates: Mild hyponatremia.    ASSESSMENT AND PLAN:  1. Atrial KMQ:KMMNO VASC Score of 2. Heart rate is well controlled, she has not issues with bleeding. I have reviewed labs from April. Continue current medication regimen. Samples of Eliquis are provided to assist her until her Rx is filled.   2. Hypertension: BP is currently well controlled. No changes. See her in 6 months. .    Current medicines are reviewed at length with the patient today.    Labs/ tests ordered today include: None No orders of the defined types were placed in this encounter.     Disposition:   FU with  6 months.  Signed, Jory Sims, NP  05/30/2015 12:58 PM    [facility] [address] [place], [place] [address] Phone: [phone]; Fax: [phone]

## 2015-05-30 NOTE — Progress Notes (Deleted)
Name: Monique James    DOB: 1941/02/01  Age: 74 y.o.  MR#: [id number]       PCP:  Vikki Ports, MD      Insurance: Payor: [employer] MEDICARE / Plan: [facility] MEDICARE / Product Type: *No Product type* /   CC:    Chief Complaint  Patient presents with  . Atrial Fibrillation  . Hypertension    VS Filed Vitals:   05/30/15 1247  BP: 140/88  Pulse: 93  Height: 5\' 5"  (1.651 m)  Weight: 197 lb (89.359 kg)  SpO2: 97%    Weights Current Weight  05/30/15 197 lb (89.359 kg)  02/14/15 199 lb 12.8 oz (90.629 kg)  11/30/14 200 lb (90.719 kg)    Blood Pressure  BP Readings from Last 3 Encounters:  05/30/15 140/88  02/14/15 142/82  11/30/14 138/82     Admit date:  (Not on file) Last encounter with RMR:  12/19/2014   Allergy Bee venom; Epinephrine; Sulfa antibiotics; Tetanus toxoids; Vancomycin; Adhesive; Influenza vaccine live; Ivp dye; and Zyloprim  Current Outpatient Prescriptions  Medication Sig Dispense Refill  . ALPRAZolam (XANAX) 0.25 MG tablet Take 1 tablet (0.25 mg total) by mouth 3 (three) times daily as needed for anxiety. 15 tablet 0  . apixaban (ELIQUIS) 5 MG TABS tablet Take 1 tablet (5 mg total) by mouth 2 (two) times daily. "Blood Thinner" 60 tablet 6  . Biotin 5000 MCG TABS Take 1 tablet by mouth daily.    . Calcium Carbonate-Vitamin D (CALCIUM 600+D) 600-400 MG-UNIT per tablet Take 1 tablet by mouth daily.     [name] Kitchen diltiazem (CARDIZEM CD) 180 MG 24 hr capsule Take 1 capsule (180 mg total) by mouth daily. Medication to control your heart rate. 90 capsule 3  . FIBER SELECT GUMMIES PO Take 1 each by mouth daily.    [name] Kitchen losartan (COZAAR) 25 MG tablet Take 1 tablet (25 mg total) by mouth daily. 90 tablet 3  . Multiple Vitamins-Minerals (MULTIVITAMIN WITH MINERALS) tablet Take 1 tablet by mouth daily.      . valACYclovir (VALTREX) 1000 MG tablet Take 1,000 mg by mouth 3 (three) times daily.      No current facility-administered medications for this visit.    Discontinued  Meds:   There are no discontinued medications.  Patient Active Problem List   Diagnosis Date Noted  . Atrial fibrillation with rapid ventricular response 11/05/2014  . Hypertension 11/05/2014  . Obesity 11/05/2014  . A-fib 11/05/2014  . Obesity (BMI 30-39.9) 02/08/2014  . Pure hypercholesterolemia 02/02/2013  . Essential hypertension, benign 07/30/2011  . Hyponatremia 07/30/2011    LABS    Component Value Date/Time   NA 130* 02/14/2015 0001   NA 131* 11/06/2014 0430   NA 134* 11/05/2014 0904   K 4.4 02/14/2015 0001   K 3.5 11/06/2014 0430   K 4.2 11/05/2014 0904   CL 94* 02/14/2015 0001   CL 99 11/06/2014 0430   CL 100 11/05/2014 0904   CO2 26 02/14/2015 0001   CO2 25 11/06/2014 0430   CO2 25 11/05/2014 0904   GLUCOSE 93 02/14/2015 0001   GLUCOSE 108* 11/06/2014 0430   GLUCOSE 118* 11/05/2014 0904   BUN 10 02/14/2015 0001   BUN 11 11/06/2014 0430   BUN 17 11/05/2014 0904   CREATININE 0.76 02/14/2015 0001   CREATININE 0.86 11/06/2014 0430   CREATININE 1.05 11/05/2014 0904   CREATININE 0.83 02/08/2014 1018   CREATININE 0.83 02/02/2013 0915   CREATININE 0.79 09/16/2011 1230  CALCIUM 10.1 02/14/2015 0001   CALCIUM 8.6 11/06/2014 0430   CALCIUM 9.9 11/05/2014 0904   GFRNONAA 65* 11/06/2014 0430   GFRNONAA 51* 11/05/2014 0904   GFRNONAA 82* 09/16/2011 1230   GFRAA 75* 11/06/2014 0430   GFRAA 59* 11/05/2014 0904   GFRAA >90 09/16/2011 1230   CMP     Component Value Date/Time   NA 130* 02/14/2015 0001   K 4.4 02/14/2015 0001   CL 94* 02/14/2015 0001   CO2 26 02/14/2015 0001   GLUCOSE 93 02/14/2015 0001   BUN 10 02/14/2015 0001   CREATININE 0.76 02/14/2015 0001   CREATININE 0.86 11/06/2014 0430   CALCIUM 10.1 02/14/2015 0001   PROT 7.3 02/14/2015 0001   ALBUMIN 4.3 02/14/2015 0001   AST 15 02/14/2015 0001   ALT 18 02/14/2015 0001   ALKPHOS 64 02/14/2015 0001   BILITOT 0.4 02/14/2015 0001   GFRNONAA 65* 11/06/2014 0430   GFRAA 75* 11/06/2014 0430        Component Value Date/Time   WBC 7.5 02/14/2015 0001   WBC 7.6 11/07/2014 0603   WBC 10.0 11/06/2014 0430   HGB 14.8 02/14/2015 0001   HGB 12.6 11/07/2014 0603   HGB 13.0 11/06/2014 0430   HCT 43.2 02/14/2015 0001   HCT 39.0 11/07/2014 0603   HCT 39.2 11/06/2014 0430   MCV 91.7 02/14/2015 0001   MCV 98.7 11/07/2014 0603   MCV 98.0 11/06/2014 0430    Lipid Panel     Component Value Date/Time   CHOL 230* 02/14/2015 0001   TRIG 101 02/14/2015 0001   HDL 84 02/14/2015 0001   CHOLHDL 2.7 02/14/2015 0001   VLDL 20 02/14/2015 0001   LDLCALC 126* 02/14/2015 0001    ABG No results found for: PHART, PCO2ART, PO2ART, HCO3, TCO2, ACIDBASEDEF, O2SAT   Lab Results  Component Value Date   TSH 2.721 11/05/2014   BNP (last 3 results) No results for input(s): BNP in the last 8760 hours.  ProBNP (last 3 results) No results for input(s): PROBNP in the last 8760 hours.  Cardiac Panel (last 3 results) No results for input(s): CKTOTAL, CKMB, TROPONINI, RELINDX in the last 72 hours.  Iron/TIBC/Ferritin/ %Sat No results found for: IRON, TIBC, FERRITIN, IRONPCTSAT   EKG Orders placed or performed during the hospital encounter of 11/05/14  . ED EKG  . ED EKG  . EKG 12-Lead  . EKG 12-Lead  . EKG 12-Lead  . EKG 12-Lead  . EKG 12-Lead  . EKG 12-Lead  . EKG     Prior Assessment and Plan Problem List as of 05/30/2015      Cardiovascular and Mediastinum   Hypertension   Essential hypertension, benign   Atrial fibrillation with rapid ventricular response   Last Assessment & Plan 11/30/2014 Office Visit Written 11/30/2014  2:10 PM by Lendon Colonel, NP    She remains in normal sinus rhythm and is asymptomatic concerning side effects of medications. She will stay on Eliquis as directed, CHADS Score of 3. Will continue current regimen. She will be seen again in 6 months unless symptomatic. She is to avoid caffeine.      A-fib     Other   Obesity   Hyponatremia   Pure  hypercholesterolemia   Obesity (BMI 30-39.9)       Imaging: No results found.

## 2015-05-30 NOTE — Patient Instructions (Signed)
Your physician wants you to follow-up in: 6 months You will receive a reminder letter in the mail two months in advance. If you don't receive a letter, please call our office to schedule the follow-up appointment.     Your physician recommends that you continue on your current medications as directed. Please refer to the Current Medication list given to you today.      Thank you for choosing [facility] !        

## 2015-06-17 DIAGNOSIS — T7840XA Allergy, unspecified, initial encounter: Secondary | ICD-10-CM | POA: Diagnosis not present

## 2015-07-13 DIAGNOSIS — Z91038 Other insect allergy status: Secondary | ICD-10-CM

## 2015-07-22 DIAGNOSIS — T7840XA Allergy, unspecified, initial encounter: Secondary | ICD-10-CM | POA: Diagnosis not present

## 2015-07-22 DIAGNOSIS — D485 Neoplasm of uncertain behavior of skin: Secondary | ICD-10-CM | POA: Diagnosis not present

## 2015-07-22 DIAGNOSIS — D225 Melanocytic nevi of trunk: Secondary | ICD-10-CM | POA: Diagnosis not present

## 2015-07-23 DIAGNOSIS — T7840XA Allergy, unspecified, initial encounter: Secondary | ICD-10-CM | POA: Diagnosis not present

## 2015-08-05 ENCOUNTER — Emergency Department (HOSPITAL_COMMUNITY)
Admission: EM | Admit: 2015-08-05 | Discharge: 2015-08-05 | Disposition: A | Discharge status: Home / Self Care | Payer: Medicare Other | Attending: Emergency Medicine | Admitting: Emergency Medicine

## 2015-08-05 ENCOUNTER — Telehealth: Payer: Self-pay | Admitting: Cardiology

## 2015-08-05 ENCOUNTER — Encounter (HOSPITAL_COMMUNITY): Payer: Self-pay | Admitting: *Deleted

## 2015-08-05 ENCOUNTER — Telehealth: Payer: Self-pay | Admitting: Physician Assistant

## 2015-08-05 DIAGNOSIS — Z79899 Other long term (current) drug therapy: Secondary | ICD-10-CM | POA: Insufficient documentation

## 2015-08-05 DIAGNOSIS — Z8619 Personal history of other infectious and parasitic diseases: Secondary | ICD-10-CM | POA: Insufficient documentation

## 2015-08-05 DIAGNOSIS — Z8719 Personal history of other diseases of the digestive system: Secondary | ICD-10-CM | POA: Diagnosis not present

## 2015-08-05 DIAGNOSIS — Z8639 Personal history of other endocrine, nutritional and metabolic disease: Secondary | ICD-10-CM | POA: Diagnosis not present

## 2015-08-05 DIAGNOSIS — I10 Essential (primary) hypertension: Secondary | ICD-10-CM | POA: Diagnosis not present

## 2015-08-05 DIAGNOSIS — Z7901 Long term (current) use of anticoagulants: Secondary | ICD-10-CM | POA: Insufficient documentation

## 2015-08-05 DIAGNOSIS — I4891 Unspecified atrial fibrillation: Secondary | ICD-10-CM | POA: Diagnosis not present

## 2015-08-05 DIAGNOSIS — Z8739 Personal history of other diseases of the musculoskeletal system and connective tissue: Secondary | ICD-10-CM | POA: Insufficient documentation

## 2015-08-05 DIAGNOSIS — Z86010 Personal history of colonic polyps: Secondary | ICD-10-CM | POA: Insufficient documentation

## 2015-08-05 DIAGNOSIS — I480 Paroxysmal atrial fibrillation: Secondary | ICD-10-CM

## 2015-08-05 DIAGNOSIS — Z872 Personal history of diseases of the skin and subcutaneous tissue: Secondary | ICD-10-CM | POA: Insufficient documentation

## 2015-08-05 LAB — CBC WITH DIFFERENTIAL/PLATELET
BASOS PCT: 0 %
Basophils Absolute: 0 10*3/uL (ref 0.0–0.1)
EOS ABS: 0.1 10*3/uL (ref 0.0–0.7)
EOS PCT: 1 %
HCT: 39.7 % (ref 36.0–46.0)
HEMOGLOBIN: 13.7 g/dL (ref 12.0–15.0)
Lymphocytes Relative: 22 %
Lymphs Abs: 2.1 10*3/uL (ref 0.7–4.0)
MCH: 32.5 pg (ref 26.0–34.0)
MCHC: 34.5 g/dL (ref 30.0–36.0)
MCV: 94.1 fL (ref 78.0–100.0)
Monocytes Absolute: 0.9 10*3/uL (ref 0.1–1.0)
Monocytes Relative: 9 %
NEUTROS PCT: 68 %
Neutro Abs: 6.4 10*3/uL (ref 1.7–7.7)
PLATELETS: 300 10*3/uL (ref 150–400)
RBC: 4.22 MIL/uL (ref 3.87–5.11)
RDW: 13 % (ref 11.5–15.5)
WBC: 9.5 10*3/uL (ref 4.0–10.5)

## 2015-08-05 LAB — BASIC METABOLIC PANEL
Anion gap: 8 (ref 5–15)
BUN: 15 mg/dL (ref 6–20)
CHLORIDE: 96 mmol/L — AB (ref 101–111)
CO2: 24 mmol/L (ref 22–32)
CREATININE: 0.94 mg/dL (ref 0.44–1.00)
Calcium: 8.8 mg/dL — ABNORMAL LOW (ref 8.9–10.3)
GFR, EST NON AFRICAN AMERICAN: 58 mL/min — AB (ref >60)
Glucose, Bld: 130 mg/dL — ABNORMAL HIGH (ref 65–99)
POTASSIUM: 3.7 mmol/L (ref 3.5–5.1)
SODIUM: 128 mmol/L — AB (ref 135–145)

## 2015-08-05 NOTE — Telephone Encounter (Signed)
Pt has been very stressed over sick dog this past week.Last night she had sensation of rapid heart rate.BP machine registered 170/? HR 122.She took a Xanax and after an hour her BP was 143/87, HR 77.Today feels fine.She asks if she could have more Xanax as she says she has been so stresses with animal that she feels this may have precipitated event.You gave 15 tabs and she has a few left and would appreciate a refill

## 2015-08-05 NOTE — ED Provider Notes (Signed)
CSN: [id number]     Arrival date & time 08/05/15  1932 History   First MD Initiated Contact with Patient 08/05/15 1946     Chief Complaint  Patient presents with  . Hypertension     (Consider location/radiation/quality/duration/timing/severity/associated sxs/prior Treatment) Patient is a 74 y.o. female presenting with hypertension. The history is provided by the patient.  Hypertension This is a chronic problem. The current episode started 12 to 24 hours ago. The problem occurs daily. The problem has been resolved. Pertinent negatives include no chest pain, no abdominal pain, no headaches and no shortness of breath. Nothing aggravates the symptoms. Nothing relieves the symptoms. She has tried nothing for the symptoms.    Past Medical History  Diagnosis Date  . Hypertension   . Macular degeneration of right eye     Dr. Mikey Bussing (mild)--MISDIAGNOSED; 12/2013 told scarring, NOT macular degeneration  . Colon polyps     Dr. Oletta Lamas  . Osteopenia     mild (T-1.3 in 01/2014)  . Hyperlipidemia     elevated trigs and LDL  . Hx of pelvic mass     complex, benign pathology  . Hemorrhoids   . History of IBS   . Constipation   . Colon polyps     path was small leiomyoma  . Gout     history of (1985ish)  . Hyponatremia     mild  . 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 Shaune Leeks)  . Atrial fibrillation with rapid ventricular response ([place]) 11/05/2014    New dx  . Shingles 02/2015   Past Surgical History  Procedure Laterality Date  . Bilateral salpingoophorectomy  2005    benign tumor/cyst (Dr. Aldean Ast)  . Abdominal hysterectomy  1970    for perforated uterus from IUD   Family History  Problem Relation Age of Onset  . Hypertension Mother   . Heart disease Mother     MI  . Dementia Mother   . Cancer Father     stomach  . Cancer Brother     multiple myeloma  . Diabetes Neg Hx   . Breast cancer Neg Hx   . Colon cancer Neg Hx   . Healthy Daughter   . Healthy Daughter   .  Healthy Daughter    Social History  Substance Use Topics  . Smoking status: Never Smoker   . Smokeless tobacco: Never Used  . Alcohol Use: 8.4 oz/week    14 Glasses of wine per week     Comment: 1 glass of wine daily, occasionally 2   OB History    Gravida Para Term Preterm AB TAB SAB Ectopic Multiple Living   '4 3   1  1   3      ' Obstetric Comments   1 set of twins; 3 pregnancies, 1 miscarriage     Review of Systems  Respiratory: Negative for shortness of breath.   Cardiovascular: Negative for chest pain.  Gastrointestinal: Negative for abdominal pain.  Neurological: Negative for headaches.  All other systems reviewed and are negative.     Allergies  Bee venom; Epinephrine; Sulfa antibiotics; Tetanus toxoids; Vancomycin; Adhesive; Influenza vaccine live; Ivp dye; and Zyloprim  Home Medications   Prior to Admission medications   Medication Sig Start Date End Date Taking? Authorizing Provider  ALPRAZolam (XANAX) 0.25 MG tablet Take 1 tablet (0.25 mg total) by mouth 3 (three) times daily as needed for anxiety. 11/07/14  Yes Rexene Alberts, MD  apixaban Arne Cleveland)  5 MG TABS tablet Take 1 tablet (5 mg total) by mouth 2 (two) times daily. "Blood Thinner" 03/27/15  Yes Lendon Colonel, NP  Biotin 5000 MCG TABS Take 1 tablet by mouth daily.   Yes Historical Provider, MD  Calcium Carbonate-Vitamin D (CALCIUM 600+D) 600-400 MG-UNIT per tablet Take 1 tablet by mouth daily.    Yes Historical Provider, MD  diltiazem (CARDIZEM CD) 180 MG 24 hr capsule Take 1 capsule (180 mg total) by mouth daily. Medication to control your heart rate. 03/04/15  Yes Lendon Colonel, NP  EPINEPHrine (EPIPEN 2-PAK) 0.3 mg/0.3 mL IJ SOAJ injection Inject 0.3 mg into the muscle once.   Yes Historical Provider, MD  fexofenadine (ALLEGRA) 180 MG tablet Take 180 mg by mouth daily as needed for allergies or rhinitis.   Yes Historical Provider, MD  FIBER SELECT GUMMIES PO Take 1 each by mouth daily.   Yes Historical  Provider, MD  losartan (COZAAR) 25 MG tablet Take 1 tablet (25 mg total) by mouth daily. 03/04/15  Yes Lendon Colonel, NP  Multiple Vitamins-Minerals (MULTIVITAMIN WITH MINERALS) tablet Take 1 tablet by mouth daily.     Yes Historical Provider, MD  valACYclovir (VALTREX) 1000 MG tablet Take 1,000 mg by mouth 3 (three) times daily.  02/11/15   Historical Provider, MD   BP 153/77 mmHg  Pulse 102  Temp(Src) 97.4 F (36.3 C) (Oral)  Resp 16  Ht '5\' 5"'  (1.651 m)  Wt 193 lb (87.544 kg)  BMI 32.12 kg/m2  SpO2 98% Physical Exam  Constitutional: She is oriented to person, place, and time. She appears well-developed and well-nourished. No distress.  HENT:  Head: Normocephalic.  Eyes: Conjunctivae are normal.  Neck: Neck supple. No tracheal deviation present.  Cardiovascular: Normal rate, regular rhythm and normal heart sounds.   Pulmonary/Chest: Effort normal and breath sounds normal. No respiratory distress.  Abdominal: Soft. She exhibits no distension.  Neurological: She is alert and oriented to person, place, and time. No cranial nerve deficit.  Skin: Skin is warm and dry.  Psychiatric: She has a normal mood and affect.    ED Course  Procedures (including critical care time) Labs Review Labs Reviewed  BASIC METABOLIC PANEL - Abnormal; Notable for the following:    Sodium 128 (*)    Chloride 96 (*)    Glucose, Bld 130 (*)    Calcium 8.8 (*)    GFR calc non Af Amer 58 (*)    All other components within normal limits  CBC WITH DIFFERENTIAL/PLATELET    Imaging Review No results found. I have personally reviewed and evaluated these images and lab results as part of my medical decision-making.   EKG Interpretation   Date/Time:  Monday August 05 2015 20:08:06 EDT Ventricular Rate:  84 PR Interval:  166 QRS Duration: 86 QT Interval:  379 QTC Calculation: 448 R Axis:   24 Text Interpretation:  Sinus rhythm Normal ECG Confirmed by Kenly Henckel MD,  Quillian Quince ([id number]) on 08/05/2015  8:10:46 PM      MDM   Final diagnoses:  Intermittent atrial fibrillation (HCC)  Chronic hypertension    74 year old female presents with feeling anxious and on edge since yesterday, she was checking her blood pressure and noticed that it was elevated here and she is on multiple agents for control blood pressure and heart rate in the setting of intermittent atrial fibrillation. She is not in atrial fibrillation today. No chest pain or other symptoms concerning for ACS. No neurologic complaints, shortness  of breath, or other signs of hypertensive emergency.  Blood pressure results with without intervention in the emergency department, patient with mild hyponatremia that appears similar to prior blood work. She is otherwise well-appearing and wishing to go home. I recommended that she follow up with her primary care physician for management of chronic hypertension.    Leo Grosser, MD 08/06/15 [phone]

## 2015-08-05 NOTE — ED Notes (Addendum)
Pt reporting elevated BP since last night. Reports taking a Xanax at that time and it improved.  States that she can "feel it creeping up" again. Denies headache, nausea, visual difficulties, or other concerns.

## 2015-08-05 NOTE — Discharge Instructions (Signed)

## 2015-08-05 NOTE — Telephone Encounter (Signed)
Pt will call pcp

## 2015-08-05 NOTE — Telephone Encounter (Signed)
Paged by answering service. Last night around 11 PM, the patient felt "heart is racing/pounding", at that time her BP was 170/112 with pulse of 122. She took xanax and her BP improved and pulse improved. She has a hx of PAF. Her symptoms is similar to previous afib episode. Recently she is under stress due to sick dog. Throughout the day today, her pulse was in 90s with minimally elevated BP. She is complient with mediations. Around 6pm today, she again started feeling of heart pounding with BP of163/73 & pulse of 107. She denies skipping of beats, sob, cp, chills, fever, dizziness, syncope, orthopnea, melena or blood in her stool.    Her symptoms is concerning for afib. I have adviced her to go to nearest ER tonight for further evaluation. Patient aggres to go to ER as she also feels she is possibly in afib.   Nyjah Schwake, [place]

## 2015-08-05 NOTE — Telephone Encounter (Signed)
Patient stated that she experienced heart racing last night with elevated BP. Would like to speak with nurse to see if she needed to be seen. / tg

## 2015-08-05 NOTE — Telephone Encounter (Signed)
Gave her those medications to hold her over until she sees PCP. Will not manage anxiety. Have her make appt with PCP.

## 2015-08-07 ENCOUNTER — Encounter: Payer: Self-pay | Admitting: Family Medicine

## 2015-08-07 ENCOUNTER — Ambulatory Visit (INDEPENDENT_AMBULATORY_CARE_PROVIDER_SITE_OTHER): Payer: Medicare Other | Admitting: Family Medicine

## 2015-08-07 VITALS — BP 132/72 | HR 84 | Ht 63.5 in | Wt 194.6 lb

## 2015-08-07 DIAGNOSIS — E871 Hypo-osmolality and hyponatremia: Secondary | ICD-10-CM

## 2015-08-07 DIAGNOSIS — I4891 Unspecified atrial fibrillation: Secondary | ICD-10-CM

## 2015-08-07 DIAGNOSIS — F419 Anxiety disorder, unspecified: Secondary | ICD-10-CM | POA: Diagnosis not present

## 2015-08-07 DIAGNOSIS — I10 Essential (primary) hypertension: Secondary | ICD-10-CM

## 2015-08-07 MED ORDER — ALPRAZOLAM 0.25 MG PO TABS: 0.2500 mg | ORAL_TABLET | Freq: Three times a day (TID) | ORAL | Status: DC | PRN

## 2015-08-07 NOTE — Progress Notes (Signed)
Chief Complaint  Patient presents with  . Anxiety    and stress has increased over the last few months. Sunday night about 11:00pm woke up and heart was pounding-heart rate was fast and bp elevated. Took a xanax and it helped. Monday night started again, called cardio and they would not refill her xanax-asked her to see PCP.   [name] Kitchen Flu Vaccine    allergic flu vaccine.    Stressed out, dogs have been sick for 1.5 weeks.  The little one had bladder and ear infection, and is better.  The other one (boxer) had been panting, not sleeping, she was worried about CHF-- had normal labs, and is now fine (was given an anxiety med).  She had been stressed, not sleeping well (doesn't normally sleep well, but worse when they were sick).  Woke up Sunday night at 11pm, an hour after going to bed, with her chest pounding.  BP was high (163/112, pulse 122).  She took a xanax, felt a little calmer, went back to bed.  In the morning her BP was 140's.  She called the cardiologist and described her episode, asking for xanax, said to see PCP to get refill on this medication. The following night her BP and pulse was up.  She called the cardiologist, told her to get EKG in ER. She was worried about recurrent atrial fib, but it was normal.  BP's had improved to 130's/84 before leaving ER.  Yesterday she felt fine, so didn't check BP.  She has 4 tablets of xanax left from prescription from January, asking for refill. Reads, crochets and colors for relaxation.  Hasn't taken other anxiety meds.  Her routine BP's, when not stressed (checks about once a month), run 120/80's.  PMH, PSH, SH reviewed. Recent ER visit and labs/studies reviewed.  Outpatient Encounter Prescriptions as of 08/07/2015  Medication Sig Note  . ALPRAZolam (XANAX) 0.25 MG tablet Take 1 tablet (0.25 mg total) by mouth 3 (three) times daily as needed for anxiety.   [name] Kitchen apixaban (ELIQUIS) 5 MG TABS tablet Take 1 tablet (5 mg total) by mouth 2 (two) times  daily. "Blood Thinner"   . Calcium Carbonate-Vitamin D (CALCIUM 600+D) 600-400 MG-UNIT per tablet Take 1 tablet by mouth daily.    [name] Kitchen diltiazem (CARDIZEM CD) 180 MG 24 hr capsule Take 1 capsule (180 mg total) by mouth daily. Medication to control your heart rate.   . fexofenadine (ALLEGRA) 180 MG tablet Take 180 mg by mouth daily as needed for allergies or rhinitis.   [name] Kitchen FIBER SELECT GUMMIES PO Take 1 each by mouth daily.   [name] Kitchen losartan (COZAAR) 25 MG tablet Take 1 tablet (25 mg total) by mouth daily.   . Multiple Vitamins-Minerals (MULTIVITAMIN WITH MINERALS) tablet Take 1 tablet by mouth daily.     . [DISCONTINUED] ALPRAZolam (XANAX) 0.25 MG tablet Take 1 tablet (0.25 mg total) by mouth 3 (three) times daily as needed for anxiety.   . [DISCONTINUED] Biotin 5000 MCG TABS Take 1 tablet by mouth daily.   [name] Kitchen EPINEPHrine (EPIPEN 2-PAK) 0.3 mg/0.3 mL IJ SOAJ injection Inject 0.3 mg into the muscle once.   . [DISCONTINUED] valACYclovir (VALTREX) 1000 MG tablet Take 1,000 mg by mouth 3 (three) times daily.  02/14/2015: Started 3 days ago for shingles from dermatologist   No facility-administered encounter medications on file as of 08/07/2015.   Allergies  Allergen Reactions  . Bee Venom Anaphylaxis  . Epinephrine Other (See Comments)    High pulse rate.  [name] Kitchen  Sulfa Antibiotics Nausea And Vomiting  . Tetanus Toxoids Swelling    Entire arm swelled  . Vancomycin Nausea And Vomiting  . Adhesive [Tape] Rash    Her skin peels off.  . Influenza Vaccine Live Swelling and Rash  . Ivp Dye [Iodinated Diagnostic Agents] Rash  . Zyloprim [Allopurinol] Rash   ROS:  No fever, chills, URI symptoms, cough, shortness of breath, chest pain.  +palpitations/tachycardia as per HPI.  No nausea, vomiting, heartburn, bowel changes, bleeding, bruising, rash.  Had some benign moles removed recently.  PHYSICAL EXAM: BP 150/86 mmHg  Pulse 84  Ht 5' 3.5" (1.613 m)  Wt 194 lb 9.6 oz (88.27 kg)  BMI 33.93 kg/m2  Very  excitable, pleasant female, in no distress.  In good spirits. BP 132/72 on repeat by MD after calming down Pulse is 80 and regular  HEENT: PERRL, EOMI, conjunctiva clear, sclera anicteric Neck: no lymphadenopathy. Thyromegaly or carotid bruit Heart: regular rate and rhythm, rate 80 Lungs: clear bilaterally Extremities: no edema Skin: healing areas from recent biopsy, without erythema crusting, warmth  Neuro: alert and oriented. Cranial nerves intact. Normal strength, gait Psych: somewhat excitable, in good spirits. Normal speech, eye contact, hygiene and grooming.  Full range of affect  Labs from ER reviewed--normal CBC.  Chem--nonfasting glu 130, Na 128 (stable for her)  ASSESSMENT/PLAN:  Anxiety - sporadic; counseled re: daily meds vs prn. Infrequently bad enough for meds, prefers xanax prn. risks/side effects reviewed - Plan: ALPRAZolam (XANAX) 0.25 MG tablet  Essential hypertension, benign - overall controlled; elevated related to stress/anxiety/excitability but normal on repeat. continue current meds  Hyponatremia - stable for her  Atrial fibrillation with rapid ventricular response (HCC) - in NSR with normal rate. Reassured  Discussed anxiety in detail--controlling/preventative meds vs prn meds. Discussed anxiety related to new diagnosis of atrial fibrillation. She recognizes that "99% of her worries are over unimportant things and I recognize that". Reviewed relaxation techniques.  Declines SSRI, would like xanax just for prn use.  If needing frequently, will re-consider.  F/u as scheduled in Spring, sooner prn

## 2015-08-07 NOTE — Patient Instructions (Signed)
Continue to monitor your blood pressure.  It was fine today (high when very excited).  Continue your current medications. We discussed preventative/controlling anxiety medications, vs just the "as needed" alprazolam.  If you find that you are having more frequent anxiety symptoms, then return to consider starting a daily medication.  Continue to use your relaxation techniques, and use the alprazolam if needed. Use caution with driving after taking this medication.

## 2015-08-08 ENCOUNTER — Ambulatory Visit (INDEPENDENT_AMBULATORY_CARE_PROVIDER_SITE_OTHER): Payer: Medicare Other | Admitting: Adult Health

## 2015-08-08 ENCOUNTER — Encounter: Payer: Self-pay | Admitting: Adult Health

## 2015-08-08 VITALS — BP 140/78 | HR 86 | Ht 64.0 in | Wt 194.0 lb

## 2015-08-08 DIAGNOSIS — I10 Essential (primary) hypertension: Secondary | ICD-10-CM | POA: Diagnosis not present

## 2015-08-08 DIAGNOSIS — I480 Paroxysmal atrial fibrillation: Secondary | ICD-10-CM

## 2015-08-08 DIAGNOSIS — F418 Other specified anxiety disorders: Secondary | ICD-10-CM | POA: Diagnosis not present

## 2015-08-08 NOTE — Progress Notes (Deleted)
Name: Monique James    DOB: [birth year]  Age: 74 y.o.  MR#: [id number]       PCP:  Vikki Ports, MD      Insurance: Payor: [employer] MEDICARE / Plan: [employer] MEDICARE / Product Type: *No Product type* /   CC:   No chief complaint on file.   VS Filed Vitals:   08/08/15 1345  BP: 140/78  Pulse: 86  Height: 5\' 4"  (1.626 m)  Weight: 194 lb (87.998 kg)  SpO2: 95%    Weights Current Weight  08/08/15 194 lb (87.998 kg)  08/07/15 194 lb 9.6 oz (88.27 kg)  08/05/15 193 lb (87.544 kg)    Blood Pressure  BP Readings from Last 3 Encounters:  08/08/15 140/78  08/07/15 132/72  08/05/15 132/65     Admit date:  (Not on file) Last encounter with RMR:  05/30/2015   Allergy Bee venom; Epinephrine; Sulfa antibiotics; Tetanus toxoids; Vancomycin; Adhesive; Influenza vaccine live; Ivp dye; and Zyloprim  Current Outpatient Prescriptions  Medication Sig Dispense Refill  . ALPRAZolam (XANAX) 0.25 MG tablet Take 1 tablet (0.25 mg total) by mouth 3 (three) times daily as needed for anxiety. 15 tablet 0  . apixaban (ELIQUIS) 5 MG TABS tablet Take 1 tablet (5 mg total) by mouth 2 (two) times daily. "Blood Thinner" 60 tablet 6  . Calcium Carbonate-Vitamin D (CALCIUM 600+D) 600-400 MG-UNIT per tablet Take 1 tablet by mouth daily.     [name] Kitchen diltiazem (CARDIZEM CD) 180 MG 24 hr capsule Take 1 capsule (180 mg total) by mouth daily. Medication to control your heart rate. 90 capsule 3  . EPINEPHrine (EPIPEN 2-PAK) 0.3 mg/0.3 mL IJ SOAJ injection Inject 0.3 mg into the muscle once.    . fexofenadine (ALLEGRA) 180 MG tablet Take 180 mg by mouth daily as needed for allergies or rhinitis.    [name] Kitchen FIBER SELECT GUMMIES PO Take 1 each by mouth daily.    [name] Kitchen losartan (COZAAR) 25 MG tablet Take 1 tablet (25 mg total) by mouth daily. 90 tablet 3  . Multiple Vitamins-Minerals (MULTIVITAMIN WITH MINERALS) tablet Take 1 tablet by mouth daily.       No current facility-administered medications for this visit.     Discontinued Meds:   There are no discontinued medications.  Patient Active Problem List   Diagnosis Date Noted  . 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 Atrial fibrillation with rapid ventricular response ([place]) 11/05/2014  . Hypertension 11/05/2014  . Obesity 11/05/2014  . A-fib ([place]) 11/05/2014  . Obesity (BMI 30-39.9) 02/08/2014  . Pure hypercholesterolemia 02/02/2013  . Essential hypertension, benign 07/30/2011  . Hyponatremia 07/30/2011    LABS    Component Value Date/Time   NA 128* 08/05/2015 2004   NA 130* 02/14/2015 0001   NA 131* 11/06/2014 0430   K 3.7 08/05/2015 2004   K 4.4 02/14/2015 0001   K 3.5 11/06/2014 0430   CL 96* 08/05/2015 2004   CL 94* 02/14/2015 0001   CL 99 11/06/2014 0430   CO2 24 08/05/2015 2004   CO2 26 02/14/2015 0001   CO2 25 11/06/2014 0430   GLUCOSE 130* 08/05/2015 2004   GLUCOSE 93 02/14/2015 0001   GLUCOSE 108* 11/06/2014 0430   BUN 15 08/05/2015 2004   BUN 10 02/14/2015 0001   BUN 11 11/06/2014 0430   CREATININE 0.94 08/05/2015 2004   CREATININE 0.76 02/14/2015 0001   CREATININE 0.86 11/06/2014 0430   CREATININE 1.05 11/05/2014 0904   CREATININE 0.83 02/08/2014  1018   CREATININE 0.83 02/02/2013 0915   CALCIUM 8.8* 08/05/2015 2004   CALCIUM 10.1 02/14/2015 0001   CALCIUM 8.6 11/06/2014 0430   GFRNONAA 58* 08/05/2015 2004   GFRNONAA 65* 11/06/2014 0430   GFRNONAA 51* 11/05/2014 0904   GFRAA >60 08/05/2015 2004   GFRAA 75* 11/06/2014 0430   GFRAA 59* 11/05/2014 0904   CMP     Component Value Date/Time   NA 128* 08/05/2015 2004   K 3.7 08/05/2015 2004   CL 96* 08/05/2015 2004   CO2 24 08/05/2015 2004   GLUCOSE 130* 08/05/2015 2004   BUN 15 08/05/2015 2004   CREATININE 0.94 08/05/2015 2004   CREATININE 0.76 02/14/2015 0001   CALCIUM 8.8* 08/05/2015 2004   PROT 7.3 02/14/2015 0001   ALBUMIN 4.3 02/14/2015 0001   AST 15 02/14/2015 0001   ALT 18 02/14/2015 0001   ALKPHOS 64 02/14/2015 0001   BILITOT 0.4 02/14/2015  0001   GFRNONAA 58* 08/05/2015 2004   GFRAA >60 08/05/2015 2004       Component Value Date/Time   WBC 9.5 08/05/2015 2004   WBC 7.5 02/14/2015 0001   WBC 7.6 11/07/2014 0603   HGB 13.7 08/05/2015 2004   HGB 14.8 02/14/2015 0001   HGB 12.6 11/07/2014 0603   HCT 39.7 08/05/2015 2004   HCT 43.2 02/14/2015 0001   HCT 39.0 11/07/2014 0603   MCV 94.1 08/05/2015 2004   MCV 91.7 02/14/2015 0001   MCV 98.7 11/07/2014 0603    Lipid Panel     Component Value Date/Time   CHOL 230* 02/14/2015 0001   TRIG 101 02/14/2015 0001   HDL 84 02/14/2015 0001   CHOLHDL 2.7 02/14/2015 0001   VLDL 20 02/14/2015 0001   LDLCALC 126* 02/14/2015 0001    ABG No results found for: PHART, PCO2ART, PO2ART, HCO3, TCO2, ACIDBASEDEF, O2SAT   Lab Results  Component Value Date   TSH 2.721 11/05/2014   BNP (last 3 results) No results for input(s): BNP in the last 8760 hours.  ProBNP (last 3 results) No results for input(s): PROBNP in the last 8760 hours.  Cardiac Panel (last 3 results) No results for input(s): CKTOTAL, CKMB, TROPONINI, RELINDX in the last 72 hours.  Iron/TIBC/Ferritin/ %Sat No results found for: IRON, TIBC, FERRITIN, IRONPCTSAT   EKG Orders placed or performed during the hospital encounter of 08/05/15  . ED EKG  . ED EKG  . EKG 12-Lead  . EKG 12-Lead  . EKG     Prior Assessment and Plan Problem List as of 08/08/2015      Cardiovascular and Mediastinum   Hypertension   Essential hypertension, benign   Atrial fibrillation with rapid ventricular response [facility])   Last Assessment & Plan 11/30/2014 Office Visit Written 11/30/2014  2:10 PM by Lendon Colonel, NP    She remains in normal sinus rhythm and is asymptomatic concerning side effects of medications. She will stay on Eliquis as directed, CHADS Score of 3. Will continue current regimen. She will be seen again in 6 months unless symptomatic. She is to avoid caffeine.      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

## 2015-08-08 NOTE — Patient Instructions (Signed)
Your physician wants you to follow-up in: 6 months with K Lawrence NP You will receive a reminder letter in the mail two months in advance. If you don't receive a letter, please call our office to schedule the follow-up appointment.    Your physician recommends that you continue on your current medications as directed. Please refer to the Current Medication list given to you today.    Thank you for choosing [facility] !        

## 2015-08-08 NOTE — Progress Notes (Signed)
Cardiology Office Note   Date:  08/08/2015   ID:  JOYEL CHENETTE, DOB Mar 27, 1941, MRN [id number]  PCP:  Vikki Ports, MD  Cardiologist:  Cloria Spring, NP   No chief complaint on file.     History of Present Illness: Monique James is a 74 y.o. female who presents for ongoing assessment and management of atrial fibrillation, CHADS VASC Score of 3,, on Eliquis, hypertension, hypercholesterolemia, with history of obesity.she is here for post ER visit after being seen for anxiousness and hypertension.she apparently called our office evening before going to the ER with a blood pressure 170/112 with a pulse 122.  She had some improvement with Xanax.  She received no significant treatment in the ER. She was given Xanax.  She was to followup with PCP for refills.  She is feeling much better today and has no further complaints of elevated blood pressure or rapid heart rhythm.  Of note, she has not been tested for sleep apnea.  This may be an option later, if she continues to have symptoms.  Past Medical History  Diagnosis Date  . Hypertension   . Macular degeneration of right eye     Dr. Mikey Bussing (mild)--MISDIAGNOSED; 12/2013 told scarring, NOT macular degeneration  . Colon polyps     Dr. Oletta Lamas  . Osteopenia     mild (T-1.3 in 01/2014)  . Hyperlipidemia     elevated trigs and LDL  . Hx of pelvic mass     complex, benign pathology  . Hemorrhoids   . History of IBS   . Constipation   . Colon polyps     path was small leiomyoma  . Gout     history of (1985ish)  . Hyponatremia     mild  . 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 Shaune Leeks)  . Atrial fibrillation with rapid ventricular response ([place]) 11/05/2014    New dx  . Shingles 02/2015    Past Surgical History  Procedure Laterality Date  . Bilateral salpingoophorectomy  2005    benign tumor/cyst (Dr. Aldean Ast)  . Abdominal hysterectomy  1970    for perforated uterus from IUD     Current Outpatient  Prescriptions  Medication Sig Dispense Refill  . ALPRAZolam (XANAX) 0.25 MG tablet Take 1 tablet (0.25 mg total) by mouth 3 (three) times daily as needed for anxiety. 15 tablet 0  . apixaban (ELIQUIS) 5 MG TABS tablet Take 1 tablet (5 mg total) by mouth 2 (two) times daily. "Blood Thinner" 60 tablet 6  . Calcium Carbonate-Vitamin D (CALCIUM 600+D) 600-400 MG-UNIT per tablet Take 1 tablet by mouth daily.     [name] Kitchen diltiazem (CARDIZEM CD) 180 MG 24 hr capsule Take 1 capsule (180 mg total) by mouth daily. Medication to control your heart rate. 90 capsule 3  . EPINEPHrine (EPIPEN 2-PAK) 0.3 mg/0.3 mL IJ SOAJ injection Inject 0.3 mg into the muscle once.    . fexofenadine (ALLEGRA) 180 MG tablet Take 180 mg by mouth daily as needed for allergies or rhinitis.    [name] Kitchen FIBER SELECT GUMMIES PO Take 1 each by mouth daily.    [name] Kitchen losartan (COZAAR) 25 MG tablet Take 1 tablet (25 mg total) by mouth daily. 90 tablet 3  . Multiple Vitamins-Minerals (MULTIVITAMIN WITH MINERALS) tablet Take 1 tablet by mouth daily.       No current facility-administered medications for this visit.    Allergies:   Bee venom; Epinephrine; Sulfa antibiotics; Tetanus toxoids; Vancomycin;  Adhesive; Influenza vaccine live; Ivp dye; and Zyloprim    Social History:  The patient  reports that she has never smoked. She has never used smokeless tobacco. She reports that she drinks about 8.4 oz of alcohol per week. She reports that she does not use illicit drugs.   Family History:  The patient's family history includes Cancer in her brother and father; Dementia in her mother; Healthy in her daughter, daughter, and daughter; Heart disease in her mother; Hypertension in her mother. There is no history of Diabetes, Breast cancer, or Colon cancer.    ROS: All other systems are reviewed and negative. Unless otherwise mentioned in H&P    PHYSICAL EXAM: VS:  BP 140/78 mmHg  Pulse 86  Ht 5\' 4"  (1.626 m)  Wt 194 lb (87.998 kg)  BMI 33.28 kg/m2   SpO2 95% , BMI Body mass index is 33.28 kg/(m^2). GEN: Well nourished, well developed, in no acute distress HEENT: normal Neck: no JVD, carotid bruits, or masses Cardiac: RRR; no murmurs, rubs, or gallops,no edema  Respiratory:  clear to auscultation bilaterally, normal work of breathing GI: soft, nontender, nondistended, + BS MS: no deformity or atrophy Skin: warm and dry, no rash Neuro:  Strength and sensation are intact Psych: euthymic mood, full affect   Recent Labs: 11/05/2014: TSH 2.721 02/14/2015: ALT 18 08/05/2015: BUN 15; Creatinine, Ser 0.94; Hemoglobin 13.7; Platelets 300; Potassium 3.7; Sodium 128*    Lipid Panel    Component Value Date/Time   CHOL 230* 02/14/2015 0001   TRIG 101 02/14/2015 0001   HDL 84 02/14/2015 0001   CHOLHDL 2.7 02/14/2015 0001   VLDL 20 02/14/2015 0001   LDLCALC 126* 02/14/2015 0001      Wt Readings from Last 3 Encounters:  08/08/15 194 lb (87.998 kg)  08/07/15 194 lb 9.6 oz (88.27 kg)  08/05/15 193 lb (87.544 kg)     ASSESSMENT AND PLAN:  1.  Paroxysmal atrial fibrillation:CHADS VASC Score of 3.  The patient's heart rate is well controlled.  She continues on anti-coagulation therapy.  I have given her samples today.  She will see [place] again in 6 months unless she has recurrent symptoms.I have suggested to her to talk with her primary care physician about having a sleep study.  Should she have recurrent palpitations, which awaken her at night.  At this time would hold off on it as she is asymptomatic. She attributed her rapid heart rhythm to anxiety related to her pets who were ill at that time.  2. Hypertension: blood pressure is much better controlled.  I will not make any changes in her medications.  3. Anxiety: She is followed by her primary care physician, who is providing her antianxiety medications.  Cardiology will not follow this.  Current medicines are reviewed at length with the patient today.    Labs/ tests ordered today include:  none. No orders of the defined types were placed in this encounter.     Disposition:   FU with [place] in 6 months.  Signed, Jory Sims, NP  08/08/2015 1:49 PM    [facility] [address] [place], [place] [address] Phone: [phone]; Fax: [phone]

## 2015-08-13 ENCOUNTER — Ambulatory Visit (INDEPENDENT_AMBULATORY_CARE_PROVIDER_SITE_OTHER): Payer: Medicare Other | Admitting: *Deleted

## 2015-08-13 DIAGNOSIS — Z91038 Other insect allergy status: Secondary | ICD-10-CM | POA: Diagnosis not present

## 2015-08-13 DIAGNOSIS — Z91030 Bee allergy status: Secondary | ICD-10-CM

## 2015-08-26 ENCOUNTER — Encounter: Payer: Self-pay | Admitting: Family Medicine

## 2015-08-26 ENCOUNTER — Ambulatory Visit (INDEPENDENT_AMBULATORY_CARE_PROVIDER_SITE_OTHER): Payer: Medicare Other | Admitting: Family Medicine

## 2015-08-26 VITALS — BP 140/86 | HR 92 | Ht 63.5 in | Wt 195.4 lb

## 2015-08-26 DIAGNOSIS — F411 Generalized anxiety disorder: Secondary | ICD-10-CM

## 2015-08-26 MED ORDER — CITALOPRAM HYDROBROMIDE 20 MG PO TABS: ORAL_TABLET | ORAL | Status: DC

## 2015-08-26 NOTE — Patient Instructions (Signed)
  Start 1/2 tablet of citalopram every morning.  If you have side effects, cut in 1/4ths (if you are able to).  After a week, increase to full tablet (if starting at 1/4th, you likely can increase to 1/2 within the week).  You can wait longer at the 1/2 tablet if you are still having some side effects. If it at all makes you drowsy, switch to night-time dosing. Expect to feel slightly more anxious at the very beginning (hopefully by starting at low dose, this effect will be minimal).  Expect to notice improvement in anxiety by 2 weeks, but can take up to 4-6 weeks to see the full effect of the dose.  Return in 6 weeks to follow up.  Call sooner if having problems. Use the alprazolam as needed to help with anxiety--you should find that as the citalopram becomes effective, you will need the alprazolam less often. Call for refill if you need it prior to your next appointment.

## 2015-08-26 NOTE — Progress Notes (Signed)
Chief Complaint  Patient presents with  . Anxiety    discuss starting daily medication for anxiety.    "I'm constantly worried about being anxious". She worries if she stays stressed out, will that cause her to go into afib, etc. She worries about her pulse a lot.  Last Thursday she awoke with a broken blood vessel in her eye, which caused her to be anxious, which raised her heart rate, which made her more anxious.  She took xanax, and 30 minutes later she felt better, pulse came down from 112 to 84.   Anxiety is worse at night, has some trouble falling asleep sometimes, but more often finds that if she awakens in the middle of the night, she can't get back to sleep.    PMH, PSH, SH reviewed.  Current Outpatient Prescriptions on File Prior to Visit  Medication Sig Dispense Refill  . ALPRAZolam (XANAX) 0.25 MG tablet Take 1 tablet (0.25 mg total) by mouth 3 (three) times daily as needed for anxiety. 15 tablet 0  . apixaban (ELIQUIS) 5 MG TABS tablet Take 1 tablet (5 mg total) by mouth 2 (two) times daily. "Blood Thinner" 60 tablet 6  . Calcium Carbonate-Vitamin D (CALCIUM 600+D) 600-400 MG-UNIT per tablet Take 1 tablet by mouth daily.     [name] Kitchen diltiazem (CARDIZEM CD) 180 MG 24 hr capsule Take 1 capsule (180 mg total) by mouth daily. Medication to control your heart rate. 90 capsule 3  . fexofenadine (ALLEGRA) 180 MG tablet Take 180 mg by mouth daily as needed for allergies or rhinitis.    [name] Kitchen FIBER SELECT GUMMIES PO Take 1 each by mouth daily.    [name] Kitchen losartan (COZAAR) 25 MG tablet Take 1 tablet (25 mg total) by mouth daily. 90 tablet 3  . Multiple Vitamins-Minerals (MULTIVITAMIN WITH MINERALS) tablet Take 1 tablet by mouth daily.      [name] Kitchen EPINEPHrine (EPIPEN 2-PAK) 0.3 mg/0.3 mL IJ SOAJ injection Inject 0.3 mg into the muscle once.     No current facility-administered medications on file prior to visit.   Allergies  Allergen Reactions  . Bee Venom Anaphylaxis  . Epinephrine Other (See Comments)     High pulse rate.  . Sulfa Antibiotics Nausea And Vomiting  . Tetanus Toxoids Swelling    Entire arm swelled  . Vancomycin Nausea And Vomiting  . Adhesive [Tape] Rash    Her skin peels off.  . Influenza Vaccine Live Swelling and Rash  . Ivp Dye [Iodinated Diagnostic Agents] Rash  . Zyloprim [Allopurinol] Rash   ROS: no fever, chills, URI symptoms, chest pain.  +palpitations/tachycardia related to anxiety. No DOE. No nausea, vomiting, bleeding, bruising, or any other concerns.  See HPI.  PHYSICAL EXAM: BP 140/86 mmHg  Pulse 92  Ht 5' 3.5" (1.613 m)  Wt 195 lb 6.4 oz (88.633 kg)  BMI 34.07 kg/m2  Well developed, pleasant, admittedly anxious female who is in no distress Normal hygiene, grooming, eye contact, speech.  ASSESSMENT/PLAN:  Generalized anxiety disorder - Plan: citalopram (CELEXA) 20 MG tablet   Risks/benefits and side effects of SSRI's reviewed in detail. Discussed paxil and celexa. Concerned about any potential weight gain, so starting with citalopram.  Start 1/2 tablet of citalopram every morning.  If you have side effects, cut in 1/4ths (if you are able to).  After a week, increase to full tablet (if starting at 1/4th, you likely can increase to 1/2 within the week).  You can wait longer at the 1/2 tablet if you  are still having some side effects. If it at all makes you drowsy, switch to night-time dosing. Expect to feel slightly more anxious at the very beginning (hopefully by starting at low dose, this effect will be minimal).  Expect to notice improvement in anxiety by 2 weeks, but can take up to 4-6 weeks to see the full effect of the dose.  Return in 6 weeks to follow up.  Call sooner if having problems. Use the alprazolam as needed to help with anxiety--you should find that as the citalopram becomes effective, you will need the alprazolam less often. Call for refill if you need it prior to your next appointment.

## 2015-08-30 ENCOUNTER — Other Ambulatory Visit: Payer: Self-pay | Admitting: Family Medicine

## 2015-08-30 DIAGNOSIS — Z1231 Encounter for screening mammogram for malignant neoplasm of breast: Secondary | ICD-10-CM

## 2015-09-05 ENCOUNTER — Ambulatory Visit (HOSPITAL_COMMUNITY)
Admission: RE | Admit: 2015-09-05 | Discharge: 2015-09-05 | Disposition: A | Discharge status: Home / Self Care | Payer: Medicare Other | Source: Ambulatory Visit | Attending: Family Medicine | Admitting: Family Medicine

## 2015-09-05 DIAGNOSIS — Z1231 Encounter for screening mammogram for malignant neoplasm of breast: Secondary | ICD-10-CM | POA: Insufficient documentation

## 2015-09-18 ENCOUNTER — Other Ambulatory Visit: Payer: Self-pay | Admitting: Family Medicine

## 2015-09-18 NOTE — Telephone Encounter (Signed)
Is this okay to refill? 

## 2015-09-18 NOTE — Telephone Encounter (Signed)
Ok to refill #15 (this was last filled 10/5, prior to starting new med)

## 2015-10-07 ENCOUNTER — Encounter: Payer: Self-pay | Admitting: Family Medicine

## 2015-10-07 ENCOUNTER — Ambulatory Visit (INDEPENDENT_AMBULATORY_CARE_PROVIDER_SITE_OTHER): Payer: Medicare Other | Admitting: Family Medicine

## 2015-10-07 VITALS — BP 130/80 | HR 76 | Ht 63.5 in | Wt 195.4 lb

## 2015-10-07 DIAGNOSIS — F411 Generalized anxiety disorder: Secondary | ICD-10-CM

## 2015-10-07 DIAGNOSIS — Z23 Encounter for immunization: Secondary | ICD-10-CM

## 2015-10-07 MED ORDER — CITALOPRAM HYDROBROMIDE 20 MG PO TABS: 20.0000 mg | ORAL_TABLET | Freq: Every day | ORAL | Status: DC

## 2015-10-07 NOTE — Progress Notes (Signed)
Chief Complaint  Patient presents with  . Panic Attack    6 week follow up. Would like to get pnuemonia vaccine today, assuming Prevnar 13.    Patient presents to follow up on anxiety.  She was started on citalopram at last visit.  She was able to increase to the full tablet without any side effects.  She feels "better and happier," "feels better about the world."  She no longer worries or obsesses about her atrial fibrillation or pulse.  She denies any palpitations or tachycardia.  Recently refilled alprazolam for #15 but hasn't used any yet (just finished the last bottle last night). Denies any side effects to the medication.  Today is the anniversary of her husband's death, 34 years ago.  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 She told me a bit about her husband--he was a Holocaust survivor.  She was worried about pneumonia vaccines when previously offered to her, due to her allergies to influenza vaccine and tetanus.  She is now willing to have Prevnar. She has never had prevnar or pneumovax.  PMH, PSH, SH reviewed.  Outpatient Encounter Prescriptions as of 10/07/2015  Medication Sig Note  . ALPRAZolam (XANAX) 0.25 MG tablet TAKE ONE TABLET BY MOUTH THREE TIMES DAILY AS NEEDED FOR  ANXIETY 10/07/2015: Used some over Thanksgiving, otherwise only rarely she takes 1/2 tablet at bedtime if she can't sleep  . apixaban (ELIQUIS) 5 MG TABS tablet Take 1 tablet (5 mg total) by mouth 2 (two) times daily. "Blood Thinner"   . Calcium Carbonate-Vitamin D (CALCIUM 600+D) 600-400 MG-UNIT per tablet Take 1 tablet by mouth daily.    . citalopram (CELEXA) 20 MG tablet Take 1 tablet (20 mg total) by mouth daily.   [name] Kitchen diltiazem (CARDIZEM CD) 180 MG 24 hr capsule Take 1 capsule (180 mg total) by mouth daily. Medication to control your heart rate.   . fexofenadine (ALLEGRA) 180 MG tablet Take 180 mg by mouth daily as needed  for allergies or rhinitis.   [name] Kitchen FIBER SELECT GUMMIES PO Take 1 each by mouth daily.   [name] Kitchen losartan (COZAAR) 25 MG tablet Take 1 tablet (25 mg total) by mouth daily.   . Multiple Vitamins-Minerals (MULTIVITAMIN WITH MINERALS) tablet Take 1 tablet by mouth daily.     . [DISCONTINUED] citalopram (CELEXA) 20 MG tablet Start with 1/2 tablet by mouth once daily. Increase to full tablet after a week. (Patient taking differently: 20 mg daily. Start with 1/2 tablet by mouth once daily. Increase to full tablet after a week.)   . EPINEPHrine (EPIPEN 2-PAK) 0.3 mg/0.3 mL IJ SOAJ injection Inject 0.3 mg into the muscle once.    No facility-administered encounter medications on file as of 10/07/2015.    Allergies  Allergen Reactions  . Bee Venom Anaphylaxis  . Epinephrine Other (See Comments)    High pulse rate.  . Sulfa Antibiotics Nausea And Vomiting  . Tetanus Toxoids Swelling    Entire arm swelled  . Vancomycin Nausea And Vomiting  . Adhesive [Tape] Rash    Her skin peels off.  . Influenza Vaccine Live Swelling and Rash  . Ivp Dye [Iodinated Diagnostic Agents] Rash  . Zyloprim [Allopurinol] Rash   ROS:  No nausea, vomiting, bowel changes, fever, chills, URI symptoms, chest pain, palpitations, shortness of breath, bleeding, bruising, rash or other complaints.  PHYSICAL EXAM: BP 130/80 mmHg  Pulse 76  Ht 5' 3.5" (1.613  m)  Wt 195 lb 6.4 oz (88.633 kg)  BMI 34.07 kg/m2  Well developed, pleasant female in no distress Psych: normal mood, affect, hygiene and grooming.  Slightly excitable/anxious, mainly in discussing this stressful day, being the anniversary of her husband's death. No longer seems anxious about her heart.  Normal speech, eye contact, hygiene and grooming  ASSESSMENT/PLAN:  Generalized anxiety disorder - Plan: citalopram (CELEXA) 20 MG tablet  Immunization due - Plan: Pneumococcal conjugate vaccine 13-valent   Anxiety--improved.  Continue citalopram, and xanax prn.  Plan to  continue for a year--a little longer, to get through the holidays and this rough time of year At that time, if still doing well, cut to 1/2 tablet for a while and then consider taper off.  Prevnar-13 today.  Risks/side effects reviewed.  Plan on pneumovax next year.

## 2015-10-07 NOTE — Patient Instructions (Signed)
Continue your current medications. Plan to continue the citalopram for at least a year. Let us know if/when you need refills on the alprazolam. See you in May for your physical, sooner if there are any problems.

## 2015-10-10 ENCOUNTER — Ambulatory Visit (INDEPENDENT_AMBULATORY_CARE_PROVIDER_SITE_OTHER): Payer: Medicare Other

## 2015-10-10 DIAGNOSIS — T63441D Toxic effect of venom of bees, accidental (unintentional), subsequent encounter: Secondary | ICD-10-CM

## 2015-10-14 DIAGNOSIS — Z1283 Encounter for screening for malignant neoplasm of skin: Secondary | ICD-10-CM | POA: Diagnosis not present

## 2015-10-14 DIAGNOSIS — D485 Neoplasm of uncertain behavior of skin: Secondary | ICD-10-CM | POA: Diagnosis not present

## 2015-10-14 DIAGNOSIS — D225 Melanocytic nevi of trunk: Secondary | ICD-10-CM | POA: Diagnosis not present

## 2015-11-15 ENCOUNTER — Telehealth: Payer: Self-pay

## 2015-11-18 NOTE — Telephone Encounter (Signed)
error 

## 2015-11-19 ENCOUNTER — Other Ambulatory Visit: Payer: Self-pay | Admitting: Adult Health

## 2015-11-26 ENCOUNTER — Encounter: Payer: Self-pay | Admitting: Adult Health

## 2015-11-26 ENCOUNTER — Telehealth: Payer: Self-pay

## 2015-11-26 ENCOUNTER — Ambulatory Visit (INDEPENDENT_AMBULATORY_CARE_PROVIDER_SITE_OTHER): Payer: Medicare Other | Admitting: Adult Health

## 2015-11-26 VITALS — BP 124/76 | HR 101 | Ht 64.0 in | Wt 197.0 lb

## 2015-11-26 DIAGNOSIS — I480 Paroxysmal atrial fibrillation: Secondary | ICD-10-CM | POA: Diagnosis not present

## 2015-11-26 DIAGNOSIS — I10 Essential (primary) hypertension: Secondary | ICD-10-CM

## 2015-11-26 DIAGNOSIS — F418 Other specified anxiety disorders: Secondary | ICD-10-CM

## 2015-11-26 NOTE — Telephone Encounter (Signed)
Pt has been taking Citalopram 20mg  and has developed side effects of wheezing and dry hacking cough. She would like to know if this medication could be changed without having to be seen?

## 2015-11-26 NOTE — Telephone Encounter (Signed)
Left on pts personal voicemail

## 2015-11-26 NOTE — Patient Instructions (Signed)

## 2015-11-26 NOTE — Progress Notes (Signed)
Cardiology Office Note   Date:  11/26/2015   ID:  NYELI DIOSDADO, DOB 1941-02-26, MRN [id number]  PCP:  Vikki Ports, MD  Cardiologist: Cloria Spring, NP   No chief complaint on file.     History of Present Illness: Monique James is a 75 y.o. female who presents for ongoing assessment and management of atrial fibrillation, CHADS VASC Score of 3,, on Eliquis, hypertension, hypercholesterolemia, with history of obesity and anxiety.    She comes today "feeling great!" She states that she has been placed on citalopram by her PCP and she is no longer anxious about her atrial fib. She is feeling better and is becoming more active. She has no complaints.   Past Medical History  Diagnosis Date  . Hypertension   . Macular degeneration of right eye     Dr. Mikey Bussing (mild)--MISDIAGNOSED; 12/2013 told scarring, NOT macular degeneration  . Colon polyps     Dr. Oletta Lamas  . Osteopenia     mild (T-1.3 in 01/2014)  . Hyperlipidemia     elevated trigs and LDL  . Hx of pelvic mass     complex, benign pathology  . Hemorrhoids   . History of IBS   . Constipation   . Colon polyps     path was small leiomyoma  . Gout     history of (1985ish)  . Hyponatremia     mild  . 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 Shaune Leeks)  . Atrial fibrillation with rapid ventricular response ([place]) 11/05/2014    New dx  . Shingles 02/2015    Past Surgical History  Procedure Laterality Date  . Bilateral salpingoophorectomy  2005    benign tumor/cyst (Dr. Aldean Ast)  . Abdominal hysterectomy  1970    for perforated uterus from IUD     Current Outpatient Prescriptions  Medication Sig Dispense Refill  . ALPRAZolam (XANAX) 0.25 MG tablet TAKE ONE TABLET BY MOUTH THREE TIMES DAILY AS NEEDED FOR  ANXIETY 15 tablet 0  . Calcium Carbonate-Vitamin D (CALCIUM 600+D) 600-400 MG-UNIT per tablet Take 1 tablet by mouth daily.     . citalopram (CELEXA) 20 MG tablet Take 1 tablet (20 mg total) by mouth  daily. 90 tablet 3  . diltiazem (CARDIZEM CD) 180 MG 24 hr capsule Take 1 capsule (180 mg total) by mouth daily. Medication to control your heart rate. 90 capsule 3  . ELIQUIS 5 MG TABS tablet TAKE ONE TABLET BY MOUTH TWICE DAILY (BLOOD THINNER) 60 tablet 6  . EPINEPHrine (EPIPEN 2-PAK) 0.3 mg/0.3 mL IJ SOAJ injection Inject 0.3 mg into the muscle once.    . fexofenadine (ALLEGRA) 180 MG tablet Take 180 mg by mouth daily as needed for allergies or rhinitis.    [name] Kitchen FIBER SELECT GUMMIES PO Take 1 each by mouth daily.    [name] Kitchen losartan (COZAAR) 25 MG tablet Take 1 tablet (25 mg total) by mouth daily. 90 tablet 3  . Multiple Vitamins-Minerals (MULTIVITAMIN WITH MINERALS) tablet Take 1 tablet by mouth daily.       No current facility-administered medications for this visit.    Allergies:   Bee venom; Epinephrine; Sulfa antibiotics; Tetanus toxoids; Vancomycin; Adhesive; Influenza vaccine live; Ivp dye; and Zyloprim    Social History:  The patient  reports that she has never smoked. She has never used smokeless tobacco. She reports that she drinks about 8.4 oz of alcohol per week. She reports that she does not use illicit  drugs.   Family History:  The patient's family history includes Cancer in her brother and father; Dementia in her mother; Healthy in her daughter, daughter, and daughter; Heart disease in her mother; Hypertension in her mother. There is no history of Diabetes, Breast cancer, or Colon cancer.    ROS: All other systems are reviewed and negative. Unless otherwise mentioned in H&P    PHYSICAL EXAM: VS:  BP 124/76 mmHg  Pulse 101  Ht 5\' 4"  (1.626 m)  Wt 197 lb (89.359 kg)  BMI 33.80 kg/m2  SpO2 96% , BMI Body mass index is 33.8 kg/(m^2). GEN: Well nourished, well developed, in no acute distress HEENT: normal Neck: no JVD, carotid bruits, or masses Cardiac: RRR; no murmurs, rubs, or gallops,no edema  Respiratory:  clear to auscultation bilaterally, normal work of breathing GI:  soft, nontender, nondistended, + BS. Obese.  MS: no deformity or atrophy Skin: warm and dry, no rash Neuro:  Strength and sensation are intact Psych: euthymic mood, full affect  Recent Labs: 02/14/2015: ALT 18 08/05/2015: BUN 15; Creatinine, Ser 0.94; Hemoglobin 13.7; Platelets 300; Potassium 3.7; Sodium 128*    Lipid Panel    Component Value Date/Time   CHOL 230* 02/14/2015 0001   TRIG 101 02/14/2015 0001   HDL 84 02/14/2015 0001   CHOLHDL 2.7 02/14/2015 0001   VLDL 20 02/14/2015 0001   LDLCALC 126* 02/14/2015 0001      Wt Readings from Last 3 Encounters:  11/26/15 197 lb (89.359 kg)  10/07/15 195 lb 6.4 oz (88.633 kg)  08/26/15 195 lb 6.4 oz (88.633 kg)     ASSESSMENT AND PLAN:  1. Paroxysmal Atrial fib: She is in NSR today and feeling well. She is tolerating Eliquis without complaints of bleeding or excessive bruising. Continue current regimen. Will see her again in 6 months unless she becomes symptomatic.   2. Hypertension: BP is well controlled. No dizziness.   3. Anxiety: Has been placed on antidepressant by her PCP and is feeling better and less anxious about her atrial fib diagnosis.    Current medicines are reviewed at length with the patient today.    Labs/ tests ordered today include:  No orders of the defined types were placed in this encounter.     Disposition:   FU with 6 months.  Signed, Jory Sims, NP  11/26/2015 1:43 PM    [facility] [address], [place], [place] [address] Phone: [phone]; Fax: [phone]

## 2015-11-26 NOTE — Progress Notes (Signed)
Name: Monique James    DOB: 1940-12-25  Age: 75 y.o.  MR#: [id number]       PCP:  Vikki Ports, MD      Insurance: Payor: [employer] MEDICARE / Plan: [facility] MEDICARE / Product Type: *No Product type* /   CC:   No chief complaint on file.   VS Filed Vitals:   11/26/15 1259  BP: 124/76  Pulse: 101  Height: 5\' 4"  (1.626 m)  Weight: 197 lb (89.359 kg)  SpO2: 96%    Weights Current Weight  11/26/15 197 lb (89.359 kg)  10/07/15 195 lb 6.4 oz (88.633 kg)  08/26/15 195 lb 6.4 oz (88.633 kg)    Blood Pressure  BP Readings from Last 3 Encounters:  11/26/15 124/76  10/07/15 130/80  08/26/15 140/86     Admit date:  (Not on file) Last encounter with RMR:  11/19/2015   Allergy Bee venom; Epinephrine; Sulfa antibiotics; Tetanus toxoids; Vancomycin; Adhesive; Influenza vaccine live; Ivp dye; and Zyloprim  Current Outpatient Prescriptions  Medication Sig Dispense Refill  . ALPRAZolam (XANAX) 0.25 MG tablet TAKE ONE TABLET BY MOUTH THREE TIMES DAILY AS NEEDED FOR  ANXIETY 15 tablet 0  . Calcium Carbonate-Vitamin D (CALCIUM 600+D) 600-400 MG-UNIT per tablet Take 1 tablet by mouth daily.     . citalopram (CELEXA) 20 MG tablet Take 1 tablet (20 mg total) by mouth daily. 90 tablet 3  . diltiazem (CARDIZEM CD) 180 MG 24 hr capsule Take 1 capsule (180 mg total) by mouth daily. Medication to control your heart rate. 90 capsule 3  . ELIQUIS 5 MG TABS tablet TAKE ONE TABLET BY MOUTH TWICE DAILY (BLOOD THINNER) 60 tablet 6  . EPINEPHrine (EPIPEN 2-PAK) 0.3 mg/0.3 mL IJ SOAJ injection Inject 0.3 mg into the muscle once.    . fexofenadine (ALLEGRA) 180 MG tablet Take 180 mg by mouth daily as needed for allergies or rhinitis.    [name] Kitchen FIBER SELECT GUMMIES PO Take 1 each by mouth daily.    [name] Kitchen losartan (COZAAR) 25 MG tablet Take 1 tablet (25 mg total) by mouth daily. 90 tablet 3  . Multiple Vitamins-Minerals (MULTIVITAMIN WITH MINERALS) tablet Take 1 tablet by mouth daily.       No current  facility-administered medications for this visit.    Discontinued Meds:   There are no discontinued medications.  Patient Active Problem List   Diagnosis Date Noted  . Generalized anxiety disorder 08/26/2015  . 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 Atrial fibrillation with rapid ventricular response ([place]) 11/05/2014  . Hypertension 11/05/2014  . Obesity 11/05/2014  . A-fib ([place]) 11/05/2014  . Obesity (BMI 30-39.9) 02/08/2014  . Pure hypercholesterolemia 02/02/2013  . Essential hypertension, benign 07/30/2011  . Hyponatremia 07/30/2011    LABS    Component Value Date/Time   NA 128* 08/05/2015 2004   NA 130* 02/14/2015 0001   NA 131* 11/06/2014 0430   K 3.7 08/05/2015 2004   K 4.4 02/14/2015 0001   K 3.5 11/06/2014 0430   CL 96* 08/05/2015 2004   CL 94* 02/14/2015 0001   CL 99 11/06/2014 0430   CO2 24 08/05/2015 2004   CO2 26 02/14/2015 0001   CO2 25 11/06/2014 0430   GLUCOSE 130* 08/05/2015 2004   GLUCOSE 93 02/14/2015 0001   GLUCOSE 108* 11/06/2014 0430   BUN 15 08/05/2015 2004   BUN 10 02/14/2015 0001   BUN 11 11/06/2014 0430   CREATININE 0.94 08/05/2015 2004   CREATININE 0.76 02/14/2015  0001   CREATININE 0.86 11/06/2014 0430   CREATININE 1.05 11/05/2014 0904   CREATININE 0.83 02/08/2014 1018   CREATININE 0.83 02/02/2013 0915   CALCIUM 8.8* 08/05/2015 2004   CALCIUM 10.1 02/14/2015 0001   CALCIUM 8.6 11/06/2014 0430   GFRNONAA 58* 08/05/2015 2004   GFRNONAA 65* 11/06/2014 0430   GFRNONAA 51* 11/05/2014 0904   GFRAA >60 08/05/2015 2004   GFRAA 75* 11/06/2014 0430   GFRAA 59* 11/05/2014 0904   CMP     Component Value Date/Time   NA 128* 08/05/2015 2004   K 3.7 08/05/2015 2004   CL 96* 08/05/2015 2004   CO2 24 08/05/2015 2004   GLUCOSE 130* 08/05/2015 2004   BUN 15 08/05/2015 2004   CREATININE 0.94 08/05/2015 2004   CREATININE 0.76 02/14/2015 0001   CALCIUM 8.8* 08/05/2015 2004   PROT 7.3 02/14/2015 0001   ALBUMIN 4.3 02/14/2015 0001   AST 15  02/14/2015 0001   ALT 18 02/14/2015 0001   ALKPHOS 64 02/14/2015 0001   BILITOT 0.4 02/14/2015 0001   GFRNONAA 58* 08/05/2015 2004   GFRAA >60 08/05/2015 2004       Component Value Date/Time   WBC 9.5 08/05/2015 2004   WBC 7.5 02/14/2015 0001   WBC 7.6 11/07/2014 0603   HGB 13.7 08/05/2015 2004   HGB 14.8 02/14/2015 0001   HGB 12.6 11/07/2014 0603   HCT 39.7 08/05/2015 2004   HCT 43.2 02/14/2015 0001   HCT 39.0 11/07/2014 0603   MCV 94.1 08/05/2015 2004   MCV 91.7 02/14/2015 0001   MCV 98.7 11/07/2014 0603    Lipid Panel     Component Value Date/Time   CHOL 230* 02/14/2015 0001   TRIG 101 02/14/2015 0001   HDL 84 02/14/2015 0001   CHOLHDL 2.7 02/14/2015 0001   VLDL 20 02/14/2015 0001   LDLCALC 126* 02/14/2015 0001    ABG No results found for: PHART, PCO2ART, PO2ART, HCO3, TCO2, ACIDBASEDEF, O2SAT   Lab Results  Component Value Date   TSH 2.721 11/05/2014   BNP (last 3 results) No results for input(s): BNP in the last 8760 hours.  ProBNP (last 3 results) No results for input(s): PROBNP in the last 8760 hours.  Cardiac Panel (last 3 results) No results for input(s): CKTOTAL, CKMB, TROPONINI, RELINDX in the last 72 hours.  Iron/TIBC/Ferritin/ %Sat No results found for: IRON, TIBC, FERRITIN, IRONPCTSAT   EKG Orders placed or performed during the hospital encounter of 08/05/15  . ED EKG  . ED EKG  . EKG 12-Lead  . EKG 12-Lead  . EKG     Prior Assessment and Plan Problem List as of 11/26/2015      Cardiovascular and Mediastinum   Hypertension   Essential hypertension, benign   Atrial fibrillation with rapid ventricular response [facility])   Last Assessment & Plan 11/30/2014 Office Visit Written 11/30/2014  2:10 PM by Lendon Colonel, NP    She remains in normal sinus rhythm and is asymptomatic concerning side effects of medications. She will stay on Eliquis as directed, CHADS Score of 3. Will continue current regimen. She will be seen again in 6 months unless  symptomatic. She is to avoid caffeine.      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

## 2015-11-26 NOTE — Telephone Encounter (Signed)
These are not side effects of this medication.  She may have something else going on causing her symptoms. OV is recommended to evaluate her cough and wheezing

## 2015-11-26 NOTE — Telephone Encounter (Signed)
Pt is scheduled for Thursday morning.

## 2015-11-28 ENCOUNTER — Ambulatory Visit (INDEPENDENT_AMBULATORY_CARE_PROVIDER_SITE_OTHER): Payer: Medicare Other | Admitting: Family Medicine

## 2015-11-28 ENCOUNTER — Encounter: Payer: Self-pay | Admitting: Family Medicine

## 2015-11-28 VITALS — BP 140/80 | HR 84 | Temp 97.6°F | Ht 63.75 in | Wt 196.2 lb

## 2015-11-28 DIAGNOSIS — R05 Cough: Secondary | ICD-10-CM

## 2015-11-28 DIAGNOSIS — I4891 Unspecified atrial fibrillation: Secondary | ICD-10-CM

## 2015-11-28 DIAGNOSIS — I10 Essential (primary) hypertension: Secondary | ICD-10-CM | POA: Diagnosis not present

## 2015-11-28 DIAGNOSIS — F411 Generalized anxiety disorder: Secondary | ICD-10-CM | POA: Diagnosis not present

## 2015-11-28 DIAGNOSIS — R059 Cough, unspecified: Secondary | ICD-10-CM

## 2015-11-28 MED ORDER — ALPRAZOLAM 0.25 MG PO TABS: ORAL_TABLET | ORAL | Status: DC

## 2015-11-28 NOTE — Patient Instructions (Signed)
  Drink plenty of fluids. Use the Allegra daily for a week or two to see if this helps the cough. Take Mucinex 12 hour twice daily (either 600 or 1200mg ) regularly also for a week or two to see if this helps with the cough.  I think your cough is related to postnasal drainage, and both of these things should help. If the cough keeps you awake at night, try using an extra pillow to sleep with the head of the bed elevated; you can also try a Delsym syrup (cough medication) at bedtime.

## 2015-11-28 NOTE — Progress Notes (Signed)
Chief Complaint  Patient presents with  . Cough    nagging cough since 10/08/15, day after her pneumo vaccine. No other symptoms, no mucus-dry cough. She has tried benadryl amd this works somewhat, Human resources officer did not work. Has not tried mucinex.   . Medication Refill    requesting refill on xanax as well.    She started with a cough the day after she got her pneumonia vaccine.  It is a dry, hacky cough.  Worse at night than during the day.  It doesn't seem to be every day, some days are worse than others.  Denies runny nose, sniffling, postnasal drainage, sore throat.  She takes Allegra only prior to allergy shots.    She has been on losartan for many years; recalls having a cough with a prior medication and cough resolved when changed to ARB.  She has heartburn sometimes if eating spicy food only.  Not daily.  Doesn't cough worse on days she has spicy foods or after eating.  Denies h/o asthma or RAD.  She sometimes feels a little wheezy when coughing at night.  Doing very well as far as the anxiety.  She had been wondering if citalopram could be contributing to her cough. She started this the end of October. She last refilled alprazolam on 11/17 for #15. Sometimes has some anxiety in the evenings. She believes she has 3-4 left, and is requesting refill.  Hasn't needed any in the last week or two.  Never needs it during the day.  She recalls needing to take it more frequently over the holidays, family visiting.  Doing well as far as atrial fib is concerned; no symptoms, or complications of anticoagulation.  PMH, PSH, SH reviewed.  Outpatient Encounter Prescriptions as of 11/28/2015  Medication Sig Note  . ALPRAZolam (XANAX) 0.25 MG tablet TAKE ONE TABLET BY MOUTH THREE TIMES DAILY AS NEEDED FOR  ANXIETY 10/07/2015: Used some over Thanksgiving, otherwise only rarely she takes 1/2 tablet at bedtime if she can't sleep  . Calcium Carbonate-Vitamin D (CALCIUM 600+D) 600-400 MG-UNIT per tablet Take 1  tablet by mouth daily.    . citalopram (CELEXA) 20 MG tablet Take 1 tablet (20 mg total) by mouth daily.   [name] Kitchen diltiazem (CARDIZEM CD) 180 MG 24 hr capsule Take 1 capsule (180 mg total) by mouth daily. Medication to control your heart rate.   [name] Kitchen ELIQUIS 5 MG TABS tablet TAKE ONE TABLET BY MOUTH TWICE DAILY (BLOOD THINNER)   . FIBER SELECT GUMMIES PO Take 1 each by mouth daily.   [name] Kitchen losartan (COZAAR) 25 MG tablet Take 1 tablet (25 mg total) by mouth daily.   . Multiple Vitamins-Minerals (MULTIVITAMIN WITH MINERALS) tablet Take 1 tablet by mouth daily.     [name] Kitchen EPINEPHrine (EPIPEN 2-PAK) 0.3 mg/0.3 mL IJ SOAJ injection Inject 0.3 mg into the muscle once. Reported on 11/28/2015   . fexofenadine (ALLEGRA) 180 MG tablet Take 180 mg by mouth daily as needed for allergies or rhinitis. Reported on 11/28/2015    No facility-administered encounter medications on file as of 11/28/2015.   Allergies  Allergen Reactions  . Bee Venom Anaphylaxis  . Epinephrine Other (See Comments)    High pulse rate.  . Sulfa Antibiotics Nausea And Vomiting  . Tetanus Toxoids Swelling    Entire arm swelled  . Vancomycin Nausea And Vomiting  . Adhesive [Tape] Rash    Her skin peels off.  . Influenza Vaccine Live Swelling and Rash  . Ivp Dye [Iodinated Diagnostic  Agents] Rash  . Zyloprim [Allopurinol] Rash   ROS:  No fever, chills, headaches, dizziness, bleeding, bruising, rash.  No chest pain, palpitations, shortness of breath.  +cough as per HPI.  No nausea, vomiting, rare heartburn, no bowel changes.  PHYSICAL EXAM: BP 140/80 mmHg  Pulse 84  Temp(Src) 97.6 F (36.4 C) (Tympanic)  Ht 5' 3.75" (1.619 m)  Wt 196 lb 3.2 oz (88.996 kg)  BMI 33.95 kg/m2 Well developed, pleasant female in no distress. No coughing during visit, but frequent throat-clearing noted. HEENT: PERRL, EOMI, conjunctiva and sclera are clear.  Mild-mod edema of mucus membranes with white mucus noted in the left nares. OP is clear. TM's and EAC's normal.  Sinuses are nontender Heart rate sounds regular, no murmur Lungs: clear bilaterally.  Good air movement. No wheezes, rales, ronchi. No cough with forced expiration Abdomen: soft, nontender, no organomegaly or mass Extremities: no edema  ASSESSMENT/PLAN:  Cough - related to allergies and postnasal drainage.  Daily Allegra, Mucinex  Generalized anxiety disorder - improved.  xanax refill sent to her pharmacy for prn use  Atrial fibrillation with rapid ventricular response ([place]) - controlled  Essential hypertension, benign - controlled   Drink plenty of fluids. Use the Allegra daily for a week or two to see if this helps the cough. Take Mucinex 12 hour twice daily (either 600 or 1200mg ) regularly also for a week or two to see if this helps with the cough.  I think your cough is related to postnasal drainage, and both of these things should help. If the cough keeps you awake at night, try using an extra pillow to sleep with the head of the bed elevated; you can also try a Delsym syrup (cough medication) at bedtime.

## 2015-12-05 ENCOUNTER — Ambulatory Visit (INDEPENDENT_AMBULATORY_CARE_PROVIDER_SITE_OTHER): Payer: Medicare Other

## 2015-12-05 DIAGNOSIS — T63441D Toxic effect of venom of bees, accidental (unintentional), subsequent encounter: Secondary | ICD-10-CM

## 2015-12-25 DIAGNOSIS — T63441A Toxic effect of venom of bees, accidental (unintentional), initial encounter: Secondary | ICD-10-CM | POA: Diagnosis not present

## 2015-12-26 DIAGNOSIS — H25813 Combined forms of age-related cataract, bilateral: Secondary | ICD-10-CM | POA: Diagnosis not present

## 2015-12-26 DIAGNOSIS — H5203 Hypermetropia, bilateral: Secondary | ICD-10-CM | POA: Diagnosis not present

## 2015-12-26 DIAGNOSIS — H524 Presbyopia: Secondary | ICD-10-CM | POA: Diagnosis not present

## 2015-12-26 DIAGNOSIS — H52223 Regular astigmatism, bilateral: Secondary | ICD-10-CM | POA: Diagnosis not present

## 2015-12-26 DIAGNOSIS — T63441A Toxic effect of venom of bees, accidental (unintentional), initial encounter: Secondary | ICD-10-CM | POA: Diagnosis not present

## 2016-01-20 DIAGNOSIS — D485 Neoplasm of uncertain behavior of skin: Secondary | ICD-10-CM | POA: Diagnosis not present

## 2016-01-20 DIAGNOSIS — L821 Other seborrheic keratosis: Secondary | ICD-10-CM | POA: Diagnosis not present

## 2016-01-20 DIAGNOSIS — D225 Melanocytic nevi of trunk: Secondary | ICD-10-CM | POA: Diagnosis not present

## 2016-01-30 ENCOUNTER — Ambulatory Visit (INDEPENDENT_AMBULATORY_CARE_PROVIDER_SITE_OTHER): Payer: Medicare Other

## 2016-01-30 DIAGNOSIS — T63441D Toxic effect of venom of bees, accidental (unintentional), subsequent encounter: Secondary | ICD-10-CM

## 2016-02-03 ENCOUNTER — Encounter: Payer: Self-pay | Admitting: Family Medicine

## 2016-02-03 ENCOUNTER — Ambulatory Visit (INDEPENDENT_AMBULATORY_CARE_PROVIDER_SITE_OTHER): Payer: Medicare Other | Admitting: Family Medicine

## 2016-02-03 ENCOUNTER — Ambulatory Visit
Admission: RE | Admit: 2016-02-03 | Discharge: 2016-02-03 | Disposition: A | Discharge status: Home / Self Care | Payer: Medicare Other | Source: Ambulatory Visit | Attending: Family Medicine | Admitting: Family Medicine

## 2016-02-03 VITALS — BP 120/70 | HR 72 | Temp 96.7°F | Ht 63.75 in | Wt 198.0 lb

## 2016-02-03 DIAGNOSIS — R05 Cough: Secondary | ICD-10-CM

## 2016-02-03 DIAGNOSIS — R059 Cough, unspecified: Secondary | ICD-10-CM

## 2016-02-03 NOTE — Progress Notes (Signed)
Chief Complaint  Patient presents with  . Cough    coughing since January visit. Tried all the things that were recommended, mucinex, robitussin, benadryl, cough drops, etc. Dry hacking cough.    Her cough was previously discussed at 11/28/15 visit, and the history was as follows: "She started with a cough the day after she got her pneumonia vaccine. It is a dry, hacky cough. Worse at night than during the day. It doesn't seem to be every day, some days are worse than others. Denies runny nose, sniffling, postnasal drainage, sore throat. She takes Allegra only prior to allergy shots.  She has been on losartan for many years; recalls having a cough with a prior medication and cough resolved when changed to ARB. She has heartburn sometimes if eating spicy food only. Not daily. Doesn't cough worse on days she has spicy foods or after eating. Denies h/o asthma or RAD. She sometimes feels a little wheezy when coughing at night."  Exam at that time was notable for white mucus in the nose, and mild-mod edema of nasal mucosa. She was told to take Allegra daily, Mucinex, dextromethorphan, to treat for post-nasal drainage. She stopped the Allegra 3 days ago and hasn't noticed a difference.  She cut her grass, and doesn't seem to be any different.  Cough is dry, hacky, denies throat-clearing (did some in the exam room, but states she just had a hard candy after a bad coughing spell, and that contributed).  It is worse in the evening.  She eats around 5.  Symptoms are worse around 8-9pm. She denies heartburn. It usually improves after a few hours, by 1am.  2 nights over this past weekend she was fine.  No worse, no better. She almost canceled this appointment after having a few good days over the weekend.    She has never tried meds for reflux.  She is on allergy shots for bee venom (nothing environmental).  Last CXR was 11/2014.  PMH, PSH, SH reviewed.  Outpatient Encounter Prescriptions as of  02/03/2016  Medication Sig  . Calcium Carbonate-Vitamin D (CALCIUM 600+D) 600-400 MG-UNIT per tablet Take 1 tablet by mouth daily.   . citalopram (CELEXA) 20 MG tablet Take 1 tablet (20 mg total) by mouth daily.  [name] Kitchen diltiazem (CARDIZEM CD) 180 MG 24 hr capsule Take 1 capsule (180 mg total) by mouth daily. Medication to control your heart rate.  [name] Kitchen ELIQUIS 5 MG TABS tablet TAKE ONE TABLET BY MOUTH TWICE DAILY (BLOOD THINNER)  . FIBER SELECT GUMMIES PO Take 1 each by mouth daily.  [name] Kitchen losartan (COZAAR) 25 MG tablet Take 1 tablet (25 mg total) by mouth daily.  . Multiple Vitamins-Minerals (MULTIVITAMIN WITH MINERALS) tablet Take 1 tablet by mouth daily.    [name] Kitchen ALPRAZolam (XANAX) 0.25 MG tablet TAKE ONE TABLET BY MOUTH THREE TIMES DAILY AS NEEDED FOR  ANXIETY (Patient not taking: Reported on 02/03/2016)  . EPINEPHrine (EPIPEN 2-PAK) 0.3 mg/0.3 mL IJ SOAJ injection Inject 0.3 mg into the muscle once. Reported on 02/03/2016  . fexofenadine (ALLEGRA) 180 MG tablet Take 180 mg by mouth daily as needed for allergies or rhinitis. Reported on 02/03/2016   No facility-administered encounter medications on file as of 02/03/2016.   Allergies  Allergen Reactions  . Bee Venom Anaphylaxis  . Epinephrine Other (See Comments)    High pulse rate.  . Sulfa Antibiotics Nausea And Vomiting  . Tetanus Toxoids Swelling    Entire arm swelled  . Vancomycin Nausea And Vomiting  .  Adhesive [Tape] Rash    Her skin peels off.  . Influenza Vaccine Live Swelling and Rash  . Ivp Dye [Iodinated Diagnostic Agents] Rash  . Zyloprim [Allopurinol] Rash    ROS: no fever, chills, headaches, dizziness, shortness of breath, chest pain, GI or GU symptoms, bleeding, bruising, rash, depression, or other concerns.  PHYSICAL EXAM: BP 120/70 mmHg  Pulse 72  Temp(Src) 96.7 F (35.9 C) (Tympanic)  Ht 5' 3.75" (1.619 m)  Wt 198 lb (89.812 kg)  BMI 34.26 kg/m2  SpO2 98%  Well appearing female, with frequent throat clearing (and slight  hoarseness, which clears after cough/clearing throat), in no distress HEENT: PERRL, EOMI, conjunctiva and sclera are clear. Nasal mucosa is mildly edematous, slightly red, no purulence. Sinuses nontender. OP is clear Neck: no lymphadenopathy or mass Heart: regular rate and rhythm Lungs: clear bilaterally Skin: no rashes or bruising, normal turgor Neuro: alert and oriented, cranial nerves intact. Normal strength, gait Psych: normal mood, affect, hygiene and grooming  ASSESSMENT/PLAN:  Cough - Plan: DG Chest 2 View   CXR today Trial of prilosec OTC x 2 weeks.  If no change, prednisone 20mg  BID x 5 days + z-pak. If not better after that, pulmonary consult.   Risks/side effects of meds reviewed. Pt to be in touch (and NOT wait so long!) if not improving with the prilosec. Reflux precautions reviewed, info given.

## 2016-02-03 NOTE — Patient Instructions (Signed)
Go to [facility] ([address]) for chest x-ray.  I suspect either postnasal drainage or reflux as cause of your ongoing cough. You didn't respond to the usual antihistamine and mucinex for the drainage. So, let's try treating for reflux. This would be a cause for your symtoms being owrse in the evening, after dinner.  Read the information below for some dietary recommendations to reduce reflux symptoms.  I think it would be a good idea to take Prilosec OTC daily for 2 weeks.  Take this before dinner.  If you don't get any improvement after two weeks, the net steps would be course of prednisone and antibiotics, followed by pulmonary consult.  Food Choices for Gastroesophageal Reflux Disease, Adult When you have gastroesophageal reflux disease (GERD), the foods you eat and your eating habits are very important. Choosing the right foods can help ease the discomfort of GERD. WHAT GENERAL GUIDELINES DO I NEED TO FOLLOW?  Choose fruits, vegetables, whole grains, low-fat dairy products, and low-fat meat, fish, and poultry.  Limit fats such as oils, salad dressings, butter, nuts, and avocado.  Keep a food diary to identify foods that cause symptoms.  Avoid foods that cause reflux. These may be different for different people.  Eat frequent small meals instead of three large meals each day.  Eat your meals slowly, in a relaxed setting.  Limit fried foods.  Cook foods using methods other than frying.  Avoid drinking alcohol.  Avoid drinking large amounts of liquids with your meals.  Avoid bending over or lying down until 2-3 hours after eating. WHAT FOODS ARE NOT RECOMMENDED? The following are some foods and drinks that may worsen your symptoms: Vegetables Tomatoes. Tomato juice. Tomato and spaghetti sauce. Chili peppers. Onion and garlic. Horseradish. Fruits Oranges, grapefruit, and lemon (fruit and juice). Meats High-fat meats, fish, and poultry. This includes hot dogs,  ribs, ham, sausage, salami, and bacon. Dairy Whole milk and chocolate milk. Sour cream. Cream. Butter. Ice cream. Cream cheese.  Beverages Coffee and tea, with or without caffeine. Carbonated beverages or energy drinks. Condiments Hot sauce. Barbecue sauce.  Sweets/Desserts Chocolate and cocoa. Donuts. Peppermint and spearmint. Fats and Oils High-fat foods, including [place] fries and potato chips. Other Vinegar. Strong spices, such as black pepper, white pepper, red pepper, cayenne, curry powder, cloves, ginger, and chili powder. The items listed above may not be a complete list of foods and beverages to avoid. Contact your dietitian for more information.   This information is not intended to replace advice given to you by your health care provider. Make sure you discuss any questions you have with your health care provider.   Document Released: 10/19/2005 Document Revised: 11/09/2014 Document Reviewed: 08/23/2013 Elsevier Interactive Patient Education [employer].

## 2016-02-12 ENCOUNTER — Telehealth: Payer: Self-pay | Admitting: *Deleted

## 2016-02-12 NOTE — Telephone Encounter (Signed)
I would like her to first try increasing the prilosec to twice daily for a few days, since that seems to have helped a lot.  See how long she has noticed the yellow drainage (she didn't report any drainage at her visit). Have her call us back Monday after using twice daily prilosec (sooner if getting worse--sinus pain, fever, etc). If ongoing discolored mucus,we can then start antibiotics. Continue the allergy meds and mucinex (allegra, I believe)

## 2016-02-12 NOTE — Telephone Encounter (Signed)
Patient advised and she will call back Monday with an update.

## 2016-02-12 NOTE — Telephone Encounter (Signed)
Patient called to give you an update after starting prilosec. States that it has helped a lot, coughing greatly reduced but is still coughing some-mostly at nights and in the morning. Is having some yellow drainage. Said you might want to try abx/steroids. I told her I would call her back. Thanks.

## 2016-02-20 ENCOUNTER — Encounter: Payer: Medicare Other | Admitting: Family Medicine

## 2016-02-24 ENCOUNTER — Other Ambulatory Visit: Payer: Self-pay | Admitting: Adult Health

## 2016-03-09 ENCOUNTER — Ambulatory Visit (INDEPENDENT_AMBULATORY_CARE_PROVIDER_SITE_OTHER): Payer: Medicare Other | Admitting: Family Medicine

## 2016-03-09 ENCOUNTER — Encounter: Payer: Self-pay | Admitting: Family Medicine

## 2016-03-09 VITALS — BP 130/70 | HR 80 | Ht 63.5 in | Wt 198.4 lb

## 2016-03-09 DIAGNOSIS — Z0000 Encounter for general adult medical examination without abnormal findings: Secondary | ICD-10-CM

## 2016-03-09 DIAGNOSIS — I10 Essential (primary) hypertension: Secondary | ICD-10-CM | POA: Diagnosis not present

## 2016-03-09 DIAGNOSIS — E871 Hypo-osmolality and hyponatremia: Secondary | ICD-10-CM

## 2016-03-09 DIAGNOSIS — E7800 Pure hypercholesterolemia, unspecified: Secondary | ICD-10-CM

## 2016-03-09 DIAGNOSIS — E669 Obesity, unspecified: Secondary | ICD-10-CM | POA: Diagnosis not present

## 2016-03-09 DIAGNOSIS — I4891 Unspecified atrial fibrillation: Secondary | ICD-10-CM

## 2016-03-09 DIAGNOSIS — Z5181 Encounter for therapeutic drug level monitoring: Secondary | ICD-10-CM | POA: Diagnosis not present

## 2016-03-09 DIAGNOSIS — E559 Vitamin D deficiency, unspecified: Secondary | ICD-10-CM | POA: Diagnosis not present

## 2016-03-09 LAB — CBC WITH DIFFERENTIAL/PLATELET
BASOS ABS: 0 {cells}/uL (ref 0–200)
BASOS PCT: 0 %
EOS ABS: 146 {cells}/uL (ref 15–500)
Eosinophils Relative: 2 %
HEMATOCRIT: 42.5 % (ref 35.0–45.0)
HEMOGLOBIN: 14.2 g/dL (ref 11.7–15.5)
LYMPHS PCT: 36 %
Lymphs Abs: 2628 cells/uL (ref 850–3900)
MCH: 30.9 pg (ref 27.0–33.0)
MCHC: 33.4 g/dL (ref 32.0–36.0)
MCV: 92.4 fL (ref 80.0–100.0)
MONOS PCT: 9 %
MPV: 9.9 fL (ref 7.5–12.5)
Monocytes Absolute: 657 cells/uL (ref 200–950)
Neutro Abs: 3869 cells/uL (ref 1500–7800)
Neutrophils Relative %: 53 %
Platelets: 345 10*3/uL (ref 140–400)
RBC: 4.6 MIL/uL (ref 3.80–5.10)
RDW: 13.8 % (ref 11.0–15.0)
WBC: 7.3 10*3/uL (ref 4.0–10.5)

## 2016-03-09 LAB — POCT URINALYSIS DIPSTICK
Bilirubin, UA: NEGATIVE
GLUCOSE UA: NEGATIVE
Ketones, UA: NEGATIVE
Leukocytes, UA: NEGATIVE
Nitrite, UA: NEGATIVE
Protein, UA: NEGATIVE
SPEC GRAV UA: 1.015
Urobilinogen, UA: NEGATIVE
pH, UA: 7.5

## 2016-03-09 LAB — COMPREHENSIVE METABOLIC PANEL
ALT: 9 U/L (ref 6–29)
AST: 12 U/L (ref 10–35)
Albumin: 4.1 g/dL (ref 3.6–5.1)
Alkaline Phosphatase: 71 U/L (ref 33–130)
BILIRUBIN TOTAL: 0.4 mg/dL (ref 0.2–1.2)
BUN: 14 mg/dL (ref 7–25)
CO2: 23 mmol/L (ref 20–31)
CREATININE: 0.75 mg/dL (ref 0.60–0.93)
Calcium: 9.7 mg/dL (ref 8.6–10.4)
Chloride: 97 mmol/L — ABNORMAL LOW (ref 98–110)
GLUCOSE: 87 mg/dL (ref 65–99)
Potassium: 4.3 mmol/L (ref 3.5–5.3)
SODIUM: 133 mmol/L — AB (ref 135–146)
Total Protein: 6.9 g/dL (ref 6.1–8.1)

## 2016-03-09 LAB — LIPID PANEL
CHOL/HDL RATIO: 2.5 ratio (ref <=5.0)
Cholesterol: 224 mg/dL — ABNORMAL HIGH (ref 125–200)
HDL: 89 mg/dL (ref >=46)
LDL CALC: 122 mg/dL (ref <130)
Triglycerides: 63 mg/dL (ref <150)
VLDL: 13 mg/dL (ref <30)

## 2016-03-09 LAB — TSH: TSH: 2.15 m[IU]/L

## 2016-03-09 NOTE — Progress Notes (Signed)
Chief Complaint  Patient presents with  . Medicare Wellness    fasting CPE/med check with pelvic. No concerns. Just had eye exam and got new glasses.    Monique James is a 75 y.o. female who presents for annual wellness visit and follow-up on chronic medical conditions.  She has the following concerns:  Seen last month with cough--sent for CXR (negative), and started prilosec. It seemed to help some, improved when she doubled up on the dose.  It got worse again the next week, and she took prednisone 66m BID x 4 day (got from her daughter), and cough resolved.  Just occasionally she gets a slight cough, possibly related to the weather.  Anxiety--related to diagnosis of atrial fibrillation.  On citalopram and doing very well. Not needing any alprazolam. Denies side effects to citalopram.  Atrial fibrillation--diagnosed 11/2014. Last saw cardiology in January, doing well on Eliquis and CBrazil Denies any palpitations, tachycardia or any symptoms.  Hyperlipidemia: Controlled by diet, which remains the same--she has continued to limit her cheese intake, continuing to eat more fruits and vegetables. She cut out the creamy dressings. She mostly eats chicken and some pork, only has a burger on the grill once a month, if even that often. Has tuna without mayonnaise (uses vinegar).    Immunization History  Administered Date(s) Administered  . Pneumococcal Conjugate-13 10/07/2015  Allergic to tetanus, flu shots. She refuses pneumovax, and zostavax  Last Pap smear: n/a, s/p hysterectomy  Last mammogram: 09/2015 Last colonoscopy: 04/2012 due again in 2018  [facility] Ophtho: yearly Dentist: twice yearly  Exercise: aerobics 3x/week ,some with weights, plus walking H/o low vitamin D in the past. Last checked 02/2015 and was slightly low at 29. She admits not to taking the supplement daily.  Other doctors caring for patient include: Dentist: Dr. LTruman HaywardOphtho: [place] Allergist: Dr. BShaune LeeksGI: Dr. EOletta LamasCardiologist: Dr. BHarl BowieDermatologist: Dr. HNevada Crane Depression screen: negative Fall screen: negative Functional Status screen: negative See epic for full questionnaires.  End of Life Discussion:  Patient does not have a living will and medical power of attorney-has forms filled out, still needs to get notarized.  Past Medical History  Diagnosis Date  . Hypertension   . Macular degeneration of right eye     Dr. GMikey Bussing(mild)--MISDIAGNOSED; 12/2013 told scarring, NOT macular degeneration  . Colon polyps     Dr. EOletta Lamas . Osteopenia     mild (T-1.3 in 01/2014)  . Hyperlipidemia     elevated trigs and LDL  . Hx of pelvic mass     complex, benign pathology  . Hemorrhoids   . History of IBS   . Constipation   . Colon polyps     path was small leiomyoma  . Gout     history of (1985ish)  . Hyponatremia     mild  . 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 BShaune Leeks  . Atrial fibrillation with rapid ventricular response ([facility] 11/05/2014    New dx  . Shingles 02/2015  . Anxiety     Past Surgical History  Procedure Laterality Date  . Bilateral salpingoophorectomy  2005    benign tumor/cyst (Dr. CAldean Ast  . Abdominal hysterectomy  1970    for perforated uterus from IUD    Social History   Social History  . Marital Status: Widowed    Spouse Name: N/A  . Number of Children: 3  .  Years of Education: N/A   Occupational History  . retired Therapist, sports    Social History Main Topics  . Smoking status: Never Smoker   . Smokeless tobacco: Never Used  . Alcohol Use: 8.4 oz/week    14 Glasses of wine per week     Comment: 1 glass of wine daily, occasionally 2  . Drug Use: No  . Sexual Activity: Not Currently   Other Topics Concern  . Not on file   Social History Narrative   Lives alone, 2 dogs;  Daughter lives nearby.  3 grandchildren.  Other children in [place].   Widowed (age 89--husband had  aortic aneurysm repair, and graft failed/ruptured)--death anniversary date 10-21-23.  He had been a Holocaust survivor.    Family History  Problem Relation Age of Onset  . Hypertension Mother   . Heart disease Mother     MI  . Dementia Mother   . Cancer Father     stomach  . Cancer Brother     multiple myeloma  . Diabetes Neg Hx   . Breast cancer Neg Hx   . Colon cancer Neg Hx   . Healthy Daughter   . Healthy Daughter   . Healthy Daughter     Outpatient Encounter Prescriptions as of 03/09/2016  Medication Sig Note  . ALPRAZolam (XANAX) 0.25 MG tablet TAKE ONE TABLET BY MOUTH THREE TIMES DAILY AS NEEDED FOR  ANXIETY (Patient not taking: Reported on 02/03/2016)   . Calcium Carbonate-Vitamin D (CALCIUM 600+D) 600-400 MG-UNIT per tablet Take 1 tablet by mouth daily.    [name] Kitchen CARTIA XT 180 MG 24 hr capsule TAKE ONE CAPSULE BY MOUTH ONCE DAILY --  MEDICATION  TO  CONTROL  YOUR  HEART  RATE   . citalopram (CELEXA) 20 MG tablet Take 1 tablet (20 mg total) by mouth daily.   [name] Kitchen ELIQUIS 5 MG TABS tablet TAKE ONE TABLET BY MOUTH TWICE DAILY (BLOOD THINNER)   . EPINEPHrine (EPIPEN 2-PAK) 0.3 mg/0.3 mL IJ SOAJ injection Inject 0.3 mg into the muscle once. Reported on 03/09/2016   . fexofenadine (ALLEGRA) 180 MG tablet Take 180 mg by mouth daily as needed for allergies or rhinitis. Reported on 02/03/2016 03/09/2016: Takes on the day of her allergy shots and prn allergy symptoms  . FIBER SELECT GUMMIES PO Take 1 each by mouth daily.   [name] Kitchen losartan (COZAAR) 25 MG tablet TAKE ONE TABLET BY MOUTH ONCE DAILY   . Multiple Vitamins-Minerals (MULTIVITAMIN WITH MINERALS) tablet Take 1 tablet by mouth daily.      No facility-administered encounter medications on file as of 03/09/2016.    Allergies  Allergen Reactions  . Bee Venom Anaphylaxis  . Epinephrine Other (See Comments)    High pulse rate.  . Sulfa Antibiotics Nausea And Vomiting  . Tetanus Toxoids Swelling    Entire arm swelled  . Vancomycin Nausea And Vomiting   . Adhesive [Tape] Rash    Her skin peels off.  . Influenza Vaccine Live Swelling and Rash  . Ivp Dye [Iodinated Diagnostic Agents] Rash  . Zyloprim [Allopurinol] Rash    ROS: The patient denies anorexia, fever, headaches, vision changes, decreased hearing, ear pain, sore throat, breast concerns, chest pain, palpitations, dizziness, syncope, dyspnea on exertion, cough (resolved after short course of prednisone last month), swelling, nausea, vomiting, diarrhea,abdominal pain, melena, hematochezia, hematuria, incontinence, dysuria, vaginal bleeding, discharge, odor or itch, genital lesions, numbness, tingling, weakness, tremor, suspicious skin lesions, depression, anxiety, abnormal bleeding/bruising, or enlarged  lymph nodes. 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 Swollen area on the right ring finger, nontender. Occasional heartburn with fatty foods (rare, relieved by Tums). No longer taking prilosec. Some mild constipation, controlled with high fiber diet and stool softeners.   PHYSICAL EXAM:  BP 130/70 mmHg  Pulse 80  Ht 5' 3.5" (1.613 m)  Wt 198 lb 6.4 oz (89.994 kg)  BMI 34.59 kg/m2  General Appearance:  Alert, cooperative, no distress, appears stated age   Head:  Normocephalic, without obvious abnormality, atraumatic   Eyes:  PERRL, conjunctiva/corneas clear, EOM's intact, fundi  benign   Ears:  Normal TM's and external ear canals   Nose:  Nares normal, mucosa normal, no drainage or sinus tenderness   Throat:  Lips, mucosa, and tongue normal; teeth and gums normal   Neck:  Supple, no lymphadenopathy; thyroid: no enlargement/tenderness/nodules; no carotid  bruit or JVD   Back:  Spine nontender, no curvature, ROM normal, no CVA tenderness   Lungs:  Clear to auscultation bilaterally without wheezes, rales or ronchi; respirations unlabored   Chest Wall:  No tenderness or deformity   Heart:   Regular rate and rhythm, S1 and S2 normal, no murmur, rub  or gallop   Breast Exam:  No tenderness, masses, or nipple discharge or inversion. No axillary lymphadenopathy   Abdomen:  Soft, non-tender, nondistended, normoactive bowel sounds,  no masses, no hepatosplenomegaly   Genitalia:  Normal external genitalia without lesions, mild atrophic changes. BUS and vagina normal; No abnormal vaginal discharge. Bimanual exam was normal, without any masses appreciable, nontender   Rectal:  Normal tone, no masses or tenderness; guaiac negative stool   Extremities:  No clubbing, cyanosis or edema. Radial aspect of the dorsal surface of the right ring finger (proximal phalanx)--soft tissue mass consistent with a cyst. nontender.  Pulses:  2+ and symmetric all extremities   Skin:  Skin color, texture, turgor normal. No rashes or lesions.  Lymph nodes:  Cervical, supraclavicular, and axillary nodes normal   Neurologic:  CNII-XII intact, normal strength, sensation and gait; reflexes 2+ and symmetric throughout   Psych:  Normal mood, affect, hygiene and grooming               ASSESSMENT/PLAN:  Annual physical exam  Encounter for Medicare annual wellness exam - Plan: POCT Urinalysis Dipstick, CANCELED: Spirometry with Graph  Essential hypertension - controlled - Plan: Comprehensive metabolic panel  Hyponatremia - Plan: Comprehensive metabolic panel  Pure hypercholesterolemia - Plan: Lipid panel  Obesity (BMI 30-39.9)  Atrial fibrillation with rapid ventricular response (HCC) - currently in NSR. Continue rate and control and anticoagulation - Plan: TSH  Medication monitoring encounter - Plan: Comprehensive metabolic panel, CBC with Differential/Platelet  Vitamin D deficiency - recheck level.  Admits to some noncompliance with supplementation. - Plan: VITAMIN D 25 Hydroxy (Vit-D Deficiency, Fractures)   Discussed monthly self  breast exams and yearly mammograms; at least 30 minutes of aerobic activity at least 5 days/week, weight-bearing exercise 2-3x/wk; proper sunscreen use reviewed; healthy diet, including goals of calcium and vitamin D intake and alcohol recommendations (less than or equal to 1 drink/day) reviewed; regular seatbelt use; changing batteries in smoke detectors. Immunization recommendations discussed--refuses zostavax and pneumovax (due to allergies to other vaccines). Colonoscopy recommendations reviewed, UTD.   Reviewed healthy diet, portion control Weight loss encouraged.  CBC, c-met, lipid, TSH, Vit D Send copies to Dr. Harl Bowie  Full code, full care  Consider taper off  citalopram in future   Medicare Attestation I have personally reviewed: The patient's medical and social history Their use of alcohol, tobacco or illicit drugs Their current medications and supplements The patient's functional ability including ADLs,fall risks, home safety risks, cognitive, and hearing and visual impairment Diet and physical activities Evidence for depression or mood disorders  The patient's weight, height, and BMI have been recorded in the chart.  I have made referrals, counseling, and provided education to the patient based on review of the above and I have provided the patient with a written personalized care plan for preventive services.     Adiva Boettner A, MD   03/09/2016

## 2016-03-09 NOTE — Patient Instructions (Signed)
  HEALTH MAINTENANCE RECOMMENDATIONS:  It is recommended that you get at least 30 minutes of aerobic exercise at least 5 days/week (for weight loss, you may need as much as 60-90 minutes). This can be any activity that gets your heart rate up. This can be divided in 10-15 minute intervals if needed, but try and build up your endurance at least once a week.  Weight bearing exercise is also recommended twice weekly.  Eat a healthy diet with lots of vegetables, fruits and fiber.  "Colorful" foods have a lot of vitamins (ie green vegetables, tomatoes, red peppers, etc).  Limit sweet tea, regular sodas and alcoholic beverages, all of which has a lot of calories and sugar.  Up to 1 alcoholic drink daily may be beneficial for women (unless trying to lose weight, watch sugars).  Drink a lot of water.  Calcium recommendations are 1200-1500 mg daily (1500 mg for postmenopausal women or women without ovaries), and vitamin D 1000 IU daily.  This should be obtained from diet and/or supplements (vitamins), and calcium should not be taken all at once, but in divided doses.  Monthly self breast exams and yearly mammograms for women over the age of 73 is recommended.  Sunscreen of at least SPF 30 should be used on all sun-exposed parts of the skin when outside between the hours of 10 am and 4 pm (not just when at beach or pool, but even with exercise, golf, tennis, and yard work!)  Use a sunscreen that says "broad spectrum" so it covers both UVA and UVB rays, and make sure to reapply every 1-2 hours.  Remember to change the batteries in your smoke detectors when changing your clock times in the spring and fall.  Use your seat belt every time you are in a car, and please drive safely and not be distracted with cell phones and texting while driving.    Monique James , Thank you for taking time to come for your Medicare Wellness Visit. I appreciate your ongoing commitment to your health goals. Please review the  following plan we discussed and let me know if I can assist you in the future.   These are the goals we discussed: Goals    None      This is a list of the screening recommended for you and due dates:  Health Maintenance  Topic Date Due  . Shingles Vaccine  12/27/1999  . Pneumonia vaccines (2 of 2 - PPSV23) 10/06/2016  . DEXA scan (bone density measurement)  Completed   Continue yearly mammograms, due again in November 2017 Consider shingles vaccine (no rush, since your shingles was 1 year ago, but would consider in the next year or so.) Pneumovax is recommended. Please re-consider and let us know if/when interested in receiving. At some point (in another 2 years or so) we can consider another bone density test.  In the interim, it is important to be sure that you are getting adequate vitamin D in order to prevent further bone loss. Colonoscopy is due again in 2018.

## 2016-03-10 ENCOUNTER — Encounter: Payer: Self-pay | Admitting: Family Medicine

## 2016-03-10 LAB — VITAMIN D 25 HYDROXY (VIT D DEFICIENCY, FRACTURES): Vit D, 25-Hydroxy: 28 ng/mL — ABNORMAL LOW (ref 30–100)

## 2016-03-26 ENCOUNTER — Ambulatory Visit (INDEPENDENT_AMBULATORY_CARE_PROVIDER_SITE_OTHER): Payer: Medicare Other

## 2016-03-26 DIAGNOSIS — T63441D Toxic effect of venom of bees, accidental (unintentional), subsequent encounter: Secondary | ICD-10-CM | POA: Diagnosis not present

## 2016-03-27 ENCOUNTER — Telehealth: Payer: Self-pay | Admitting: Cardiology

## 2016-03-27 NOTE — Telephone Encounter (Signed)
Ok per Monique Long NP to take,pt states pharmacist already told her the same

## 2016-03-27 NOTE — Telephone Encounter (Signed)
Pt would like to know if she can take Glucosamine Chondroitin since she is on Eliquis

## 2016-04-20 DIAGNOSIS — D485 Neoplasm of uncertain behavior of skin: Secondary | ICD-10-CM | POA: Diagnosis not present

## 2016-05-19 DIAGNOSIS — T63441D Toxic effect of venom of bees, accidental (unintentional), subsequent encounter: Secondary | ICD-10-CM | POA: Diagnosis not present

## 2016-05-20 DIAGNOSIS — T63441D Toxic effect of venom of bees, accidental (unintentional), subsequent encounter: Secondary | ICD-10-CM | POA: Diagnosis not present

## 2016-05-21 ENCOUNTER — Ambulatory Visit (INDEPENDENT_AMBULATORY_CARE_PROVIDER_SITE_OTHER): Payer: Medicare Other

## 2016-05-21 DIAGNOSIS — T63441D Toxic effect of venom of bees, accidental (unintentional), subsequent encounter: Secondary | ICD-10-CM | POA: Diagnosis not present

## 2016-05-25 ENCOUNTER — Encounter: Payer: Self-pay | Admitting: Adult Health

## 2016-05-25 ENCOUNTER — Ambulatory Visit (INDEPENDENT_AMBULATORY_CARE_PROVIDER_SITE_OTHER): Payer: Medicare Other | Admitting: Adult Health

## 2016-05-25 VITALS — BP 110/60 | HR 96 | Ht 64.0 in | Wt 198.0 lb

## 2016-05-25 DIAGNOSIS — I480 Paroxysmal atrial fibrillation: Secondary | ICD-10-CM | POA: Diagnosis not present

## 2016-05-25 NOTE — Patient Instructions (Signed)
Your physician wants you to follow-up in: 6 months You will receive a reminder letter in the mail two months in advance. If you don't receive a letter, please call our office to schedule the follow-up appointment.     Your physician recommends that you continue on your current medications as directed. Please refer to the Current Medication list given to you today.      Thank you for choosing [facility] !        

## 2016-05-25 NOTE — Progress Notes (Signed)
Cardiology Office Note   Date:  05/25/2016   ID:  Monique James, DOB 01-28-1941, MRN [id number]  PCP:  Vikki Ports, MD  Cardiologist:  Cloria Spring, NP   Chief Complaint  Patient presents with  . Atrial Fibrillation      History of Present Illness: Monique James is a 75 y.o. female who presents for assessment and management of atrial fibrillation,CHADS VASC  Score of 3, on ELIQUIS, hypertension, hypercholesterolemia, history of obesity, anxiety. The patient was last seen in the office on 11/26/2015. She was without complaint at that time.no medication changes were made.  She comes today feeling very well. No new complaints, tolerating her medications without side effects.   Past Medical History:  Diagnosis Date  . Anxiety   . Atrial fibrillation with rapid ventricular response ([place]) 11/05/2014   New dx  . 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 Shaune Leeks)  . Colon polyps    Dr. Oletta Lamas  . Colon polyps    path was small leiomyoma  . Constipation   . Gout    history of (1985ish)  . Hemorrhoids   . History of IBS   . Hx of pelvic mass    complex, benign pathology  . Hyperlipidemia    elevated trigs and LDL  . Hypertension   . Hyponatremia    mild  . Macular degeneration of right eye    Dr. Mikey Bussing (mild)--MISDIAGNOSED; 12/2013 told scarring, NOT macular degeneration  . Osteopenia    mild (T-1.3 in 01/2014)  . Shingles 02/2015    Past Surgical History:  Procedure Laterality Date  . ABDOMINAL HYSTERECTOMY  1970   for perforated uterus from IUD  . BILATERAL SALPINGOOPHORECTOMY  2005   benign tumor/cyst (Dr. Aldean Ast)     Current Outpatient Prescriptions  Medication Sig Dispense Refill  . Calcium Carbonate-Vitamin D (CALCIUM 600+D) 600-400 MG-UNIT per tablet Take 1 tablet by mouth daily.     [name] Kitchen CARTIA XT 180 MG 24 hr capsule TAKE ONE CAPSULE BY MOUTH ONCE DAILY --  MEDICATION  TO  CONTROL  YOUR  HEART  RATE 90 capsule 1  . citalopram (CELEXA) 20  MG tablet Take 1 tablet (20 mg total) by mouth daily. 90 tablet 3  . ELIQUIS 5 MG TABS tablet TAKE ONE TABLET BY MOUTH TWICE DAILY (BLOOD THINNER) 60 tablet 6  . EPINEPHrine (EPIPEN 2-PAK) 0.3 mg/0.3 mL IJ SOAJ injection Inject 0.3 mg into the muscle once. Reported on 03/09/2016    . fexofenadine (ALLEGRA) 180 MG tablet Take 180 mg by mouth daily as needed for allergies or rhinitis. Reported on 02/03/2016    . FIBER SELECT GUMMIES PO Take 1 each by mouth daily.    [name] Kitchen losartan (COZAAR) 25 MG tablet TAKE ONE TABLET BY MOUTH ONCE DAILY 90 tablet 1  . Multiple Vitamins-Minerals (MULTIVITAMIN WITH MINERALS) tablet Take 1 tablet by mouth daily.       No current facility-administered medications for this visit.     Allergies:   Bee venom; Epinephrine; Sulfa antibiotics; Tetanus toxoids; Vancomycin; Adhesive [tape]; Influenza vaccine live; Ivp dye [iodinated diagnostic agents]; and Zyloprim [allopurinol]    Social History:  The patient  reports that she has never smoked. She has never used smokeless tobacco. She reports that she drinks about 8.4 oz of alcohol per week . She reports that she does not use drugs.   Family History:  The patient's family history includes Cancer in her brother and father; Dementia in her  mother; Healthy in her daughter, daughter, and daughter; Heart disease in her mother; Hypertension in her mother.    ROS: All other systems are reviewed and negative. Unless otherwise mentioned in H&P    PHYSICAL EXAM: VS:  BP 110/60   Pulse 96   Ht 5\' 4"  (1.626 m)   Wt 198 lb (89.8 kg)   SpO2 96%   BMI 33.99 kg/m  , BMI Body mass index is 33.99 kg/m. GEN: Well nourished, well developed, in no acute distress  HEENT: normal  Neck: no JVD, carotid bruits, or masses Cardiac: RRR; no murmurs, rubs, or gallops,no edema  Respiratory:  clear to auscultation bilaterally, normal work of breathing GI: soft, nontender, nondistended, + BS MS: no deformity or atrophy  Skin: warm and dry, no  rash Neuro:  Strength and sensation are intact Psych: euthymic mood, full affect   Recent Labs: 03/09/2016: ALT 9; BUN 14; Creat 0.75; Hemoglobin 14.2; Platelets 345; Potassium 4.3; Sodium 133; TSH 2.15    Lipid Panel    Component Value Date/Time   CHOL 224 (H) 03/09/2016 1001   TRIG 63 03/09/2016 1001   HDL 89 03/09/2016 1001   CHOLHDL 2.5 03/09/2016 1001   VLDL 13 03/09/2016 1001   LDLCALC 122 03/09/2016 1001      Wt Readings from Last 3 Encounters:  05/25/16 198 lb (89.8 kg)  03/09/16 198 lb 6.4 oz (90 kg)  02/03/16 198 lb (89.8 kg)       ASSESSMENT AND PLAN:  1. Atrial fibrillation:  Heart rate is well controlled. No changes in medications. She will continue to be seen Q 6 months.  2. Hypertension: Well controlled. Labs per PCP.      Current medicines are reviewed at length with the patient today.    Labs/ tests ordered today include:  No orders of the defined types were placed in this encounter.    Disposition:   FU with 6 months Signed, Jory Sims, NP  05/25/2016 1:11 PM    [facility] [address] [place], [place] [address] Phone: [phone]; Fax: [phone]

## 2016-05-25 NOTE — Progress Notes (Signed)
Name: Monique James    DOB: 1941/03/14  Age: 75 y.o.  MR#: [id number]       PCP:  Vikki Ports, MD      Insurance: Payor: [employer] MEDICARE / Plan: [facility] MEDICARE / Product Type: *No Product type* /   CC:   No chief complaint on file.   VS Vitals:   05/25/16 1307  BP: 110/60  Weight: 198 lb (89.8 kg)  Height: 5\' 4"  (1.626 m)    Weights Current Weight  05/25/16 198 lb (89.8 kg)  03/09/16 198 lb 6.4 oz (90 kg)  02/03/16 198 lb (89.8 kg)    Blood Pressure  BP Readings from Last 3 Encounters:  05/25/16 110/60  03/09/16 130/70  02/03/16 120/70     Admit date:  (Not on file) Last encounter with RMR:  02/24/2016   Allergy Bee venom; Epinephrine; Sulfa antibiotics; Tetanus toxoids; Vancomycin; Adhesive [tape]; Influenza vaccine live; Ivp dye [iodinated diagnostic agents]; and Zyloprim [allopurinol]  Current Outpatient Prescriptions  Medication Sig Dispense Refill  . Calcium Carbonate-Vitamin D (CALCIUM 600+D) 600-400 MG-UNIT per tablet Take 1 tablet by mouth daily.     [name] Kitchen CARTIA XT 180 MG 24 hr capsule TAKE ONE CAPSULE BY MOUTH ONCE DAILY --  MEDICATION  TO  CONTROL  YOUR  HEART  RATE 90 capsule 1  . citalopram (CELEXA) 20 MG tablet Take 1 tablet (20 mg total) by mouth daily. 90 tablet 3  . ELIQUIS 5 MG TABS tablet TAKE ONE TABLET BY MOUTH TWICE DAILY (BLOOD THINNER) 60 tablet 6  . EPINEPHrine (EPIPEN 2-PAK) 0.3 mg/0.3 mL IJ SOAJ injection Inject 0.3 mg into the muscle once. Reported on 03/09/2016    . fexofenadine (ALLEGRA) 180 MG tablet Take 180 mg by mouth daily as needed for allergies or rhinitis. Reported on 02/03/2016    . FIBER SELECT GUMMIES PO Take 1 each by mouth daily.    [name] Kitchen losartan (COZAAR) 25 MG tablet TAKE ONE TABLET BY MOUTH ONCE DAILY 90 tablet 1  . Multiple Vitamins-Minerals (MULTIVITAMIN WITH MINERALS) tablet Take 1 tablet by mouth daily.       No current facility-administered medications for this visit.     Discontinued Meds:    Medications Discontinued  During This Encounter  Medication Reason  . ALPRAZolam (XANAX) 0.25 MG tablet Error    Patient Active Problem List   Diagnosis Date Noted  . Generalized anxiety disorder 08/26/2015  . 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 Atrial fibrillation with rapid ventricular response ([place]) 11/05/2014  . Hypertension 11/05/2014  . Obesity 11/05/2014  . A-fib ([place]) 11/05/2014  . Obesity (BMI 30-39.9) 02/08/2014  . Pure hypercholesterolemia 02/02/2013  . Essential hypertension, benign 07/30/2011  . Hyponatremia 07/30/2011    LABS    Component Value Date/Time   NA 133 (L) 03/09/2016 1001   NA 128 (L) 08/05/2015 2004   NA 130 (L) 02/14/2015 0001   K 4.3 03/09/2016 1001   K 3.7 08/05/2015 2004   K 4.4 02/14/2015 0001   CL 97 (L) 03/09/2016 1001   CL 96 (L) 08/05/2015 2004   CL 94 (L) 02/14/2015 0001   CO2 23 03/09/2016 1001   CO2 24 08/05/2015 2004   CO2 26 02/14/2015 0001   GLUCOSE 87 03/09/2016 1001   GLUCOSE 130 (H) 08/05/2015 2004   GLUCOSE 93 02/14/2015 0001   BUN 14 03/09/2016 1001   BUN 15 08/05/2015 2004   BUN 10 02/14/2015 0001   CREATININE 0.75 03/09/2016 1001  CREATININE 0.94 08/05/2015 2004   CREATININE 0.76 02/14/2015 0001   CREATININE 0.86 11/06/2014 0430   CREATININE 1.05 11/05/2014 0904   CREATININE 0.83 02/08/2014 1018   CALCIUM 9.7 03/09/2016 1001   CALCIUM 8.8 (L) 08/05/2015 2004   CALCIUM 10.1 02/14/2015 0001   GFRNONAA 58 (L) 08/05/2015 2004   GFRNONAA 65 (L) 11/06/2014 0430   GFRNONAA 51 (L) 11/05/2014 0904   GFRAA >60 08/05/2015 2004   GFRAA 75 (L) 11/06/2014 0430   GFRAA 59 (L) 11/05/2014 0904   CMP     Component Value Date/Time   NA 133 (L) 03/09/2016 1001   K 4.3 03/09/2016 1001   CL 97 (L) 03/09/2016 1001   CO2 23 03/09/2016 1001   GLUCOSE 87 03/09/2016 1001   BUN 14 03/09/2016 1001   CREATININE 0.75 03/09/2016 1001   CALCIUM 9.7 03/09/2016 1001   PROT 6.9 03/09/2016 1001   ALBUMIN 4.1 03/09/2016 1001   AST 12 03/09/2016 1001   ALT 9  03/09/2016 1001   ALKPHOS 71 03/09/2016 1001   BILITOT 0.4 03/09/2016 1001   GFRNONAA 58 (L) 08/05/2015 2004   GFRAA >60 08/05/2015 2004       Component Value Date/Time   WBC 7.3 03/09/2016 1001   WBC 9.5 08/05/2015 2004   WBC 7.5 02/14/2015 0001   HGB 14.2 03/09/2016 1001   HGB 13.7 08/05/2015 2004   HGB 14.8 02/14/2015 0001   HCT 42.5 03/09/2016 1001   HCT 39.7 08/05/2015 2004   HCT 43.2 02/14/2015 0001   MCV 92.4 03/09/2016 1001   MCV 94.1 08/05/2015 2004   MCV 91.7 02/14/2015 0001    Lipid Panel     Component Value Date/Time   CHOL 224 (H) 03/09/2016 1001   TRIG 63 03/09/2016 1001   HDL 89 03/09/2016 1001   CHOLHDL 2.5 03/09/2016 1001   VLDL 13 03/09/2016 1001   LDLCALC 122 03/09/2016 1001    ABG No results found for: PHART, PCO2ART, PO2ART, HCO3, TCO2, ACIDBASEDEF, O2SAT   Lab Results  Component Value Date   TSH 2.15 03/09/2016   BNP (last 3 results) No results for input(s): BNP in the last 8760 hours.  ProBNP (last 3 results) No results for input(s): PROBNP in the last 8760 hours.  Cardiac Panel (last 3 results) No results for input(s): CKTOTAL, CKMB, TROPONINI, RELINDX in the last 72 hours.  Iron/TIBC/Ferritin/ %Sat No results found for: IRON, TIBC, FERRITIN, IRONPCTSAT   EKG Orders placed or performed during the hospital encounter of 08/05/15  . ED EKG  . ED EKG  . EKG 12-Lead  . EKG 12-Lead  . EKG     Prior Assessment and Plan Problem List as of 05/25/2016 Reviewed: 03/09/2016  9:56 AM by Rita Ohara A, MD     Cardiovascular and Mediastinum   Hypertension   Essential hypertension, benign   Atrial fibrillation with rapid ventricular response [facility])   Last Assessment & Plan 11/30/2014 Office Visit Written 11/30/2014  2:10 PM by Lendon Colonel, NP    She remains in normal sinus rhythm and is asymptomatic concerning side effects of medications. She will stay on Eliquis as directed, CHADS Score of 3. Will continue current regimen. She will be seen  again in 6 months unless symptomatic. She is to avoid caffeine.      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

## 2016-06-30 ENCOUNTER — Encounter (INDEPENDENT_AMBULATORY_CARE_PROVIDER_SITE_OTHER): Payer: Self-pay

## 2016-06-30 ENCOUNTER — Ambulatory Visit (INDEPENDENT_AMBULATORY_CARE_PROVIDER_SITE_OTHER): Payer: Medicare Other | Admitting: Allergy and Immunology

## 2016-06-30 ENCOUNTER — Encounter: Payer: Self-pay | Admitting: Allergy and Immunology

## 2016-06-30 DIAGNOSIS — Z91038 Other insect allergy status: Secondary | ICD-10-CM | POA: Diagnosis not present

## 2016-06-30 MED ORDER — EPINEPHRINE 0.3 MG/0.3ML IJ SOAJ: 0.3000 mg | Freq: Once | INTRAMUSCULAR | 1 refills | Status: AC

## 2016-06-30 NOTE — Progress Notes (Signed)
Follow-up Note  RE: Monique James MRN: [id number] DOB: 24-Feb-1941 Date of Office Visit: 06/30/2016  Primary care provider: Vikki Ports, MD Referring provider: Rita Ohara, MD  History of present illness: Monique James is a 75 y.o. 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  She was last seen in this clinic in July 2016.  She has been on venom immunotherapy for approximately 30 years.  At one point, the immunotherapy was discontinued and she had a severe reaction to a wasp sting so the injections were restarted.  She would like to continue the venom immunotherapy as [place] spends time outdoors[place] and [place].  She needs a refill on her epinephrine autoinjector.   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    A refill prescription has been provided for epinephrine 0.3 mg autoinjector 2 pack.  Emergency allergy action plan is in place.   Meds ordered this encounter  Medications  . EPINEPHrine 0.3 mg/0.3 mL IJ SOAJ injection    Sig: Inject 0.3 mLs (0.3 mg total) into the muscle once.    Dispense:  2 Device    Refill:  1    DISPENSE MYLAN GENERIC    Physical examination: Blood pressure 130/82, pulse 80, resp. rate 16, height 5\' 3"  (1.6 m), weight 201 lb (91.2 kg).  General: Alert, interactive, in no acute distress. Neck: Supple without lymphadenopathy. Lungs: Clear to auscultation without wheezing, rhonchi or rales. CV: Normal S1, S2 without murmurs. Skin: Warm and dry, without lesions or rashes.  The following portions of the patient's history were reviewed and updated as appropriate: allergies, current medications, past family history, past medical history, past social history, past surgical history and problem list.    Medication List         Accurate as of 06/30/16 11:55 AM. Always use your most recent med list.          CALCIUM 600+D 600-400 MG-UNIT tablet Generic drug:  Calcium Carbonate-Vitamin D Take 1 tablet by mouth daily.   CARTIA XT 180 MG 24 hr capsule Generic drug:  diltiazem TAKE ONE CAPSULE BY MOUTH ONCE DAILY --  MEDICATION  TO  CONTROL  YOUR  HEART  RATE   citalopram 20 MG tablet Commonly known as:  CELEXA Take 1 tablet (20 mg total) by mouth daily.   diphenhydrAMINE 25 MG tablet Commonly known as:  BENADRYL Take 25 mg by mouth every 6 (six) hours as needed.   ELIQUIS 5 MG Tabs tablet Generic drug:  apixaban TAKE ONE TABLET BY MOUTH TWICE DAILY (BLOOD THINNER)   EPIPEN 2-PAK 0.3 mg/0.3 mL Soaj injection Generic drug:  EPINEPHrine Inject 0.3 mg into the muscle once. Reported on 03/09/2016   EPINEPHrine 0.3 mg/0.3 mL Soaj injection Commonly known as:  EPI-PEN Inject 0.3 mLs (0.3 mg total) into the muscle once.   fexofenadine 180 MG tablet Commonly known as:  ALLEGRA Take 180 mg by mouth daily as needed for allergies or rhinitis. Reported on 02/03/2016   FIBER SELECT GUMMIES PO Take 1 each by mouth daily.   losartan 25 MG tablet Commonly known as:  COZAAR TAKE ONE TABLET BY MOUTH ONCE DAILY   multivitamin with minerals tablet Take 1 tablet by mouth daily.       Allergies  Allergen Reactions  . Bee  Venom Anaphylaxis  . Epinephrine Other (See Comments)    High pulse rate.  . Sulfa Antibiotics Nausea And Vomiting  . Tetanus Toxoids Swelling    Entire arm swelled  . Vancomycin Nausea And Vomiting  . Adhesive [Tape] Rash    Her skin peels off.  . Influenza Vaccine Live Swelling and Rash  . Ivp Dye [Iodinated Diagnostic Agents] Rash  . Zyloprim [Allopurinol] Rash    I appreciate the opportunity to take part in Monique James's care. Please do not hesitate to contact me with questions.  Sincerely,   R. Edgar Frisk, MD

## 2016-06-30 NOTE — Assessment & Plan Note (Signed)
   Continue careful avoidance of flying insects and have access to epinephrine autoinjector 2 pack in case of systemic symptoms associated with a sting.    A refill prescription has been provided for epinephrine 0.3 mg autoinjector 2 pack.  Emergency allergy action plan is in place.

## 2016-06-30 NOTE — Patient Instructions (Signed)
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    A refill prescription has been provided for epinephrine 0.3 mg autoinjector 2 pack.  Emergency allergy action plan is in place.   Return in about 1 year (around 06/30/2017), or if symptoms worsen or fail to improve.

## 2016-07-16 ENCOUNTER — Ambulatory Visit (INDEPENDENT_AMBULATORY_CARE_PROVIDER_SITE_OTHER): Payer: Medicare Other | Admitting: *Deleted

## 2016-07-16 DIAGNOSIS — T63441D Toxic effect of venom of bees, accidental (unintentional), subsequent encounter: Secondary | ICD-10-CM

## 2016-08-02 ENCOUNTER — Encounter (HOSPITAL_COMMUNITY): Payer: Self-pay | Admitting: *Deleted

## 2016-08-02 ENCOUNTER — Telehealth: Payer: Self-pay | Admitting: Physician Assistant

## 2016-08-02 ENCOUNTER — Emergency Department (HOSPITAL_COMMUNITY)
Admission: EM | Admit: 2016-08-02 | Discharge: 2016-08-02 | Disposition: A | Discharge status: Home / Self Care | Payer: Medicare Other | Attending: Emergency Medicine | Admitting: Emergency Medicine

## 2016-08-02 ENCOUNTER — Telehealth: Payer: Self-pay | Admitting: Cardiology

## 2016-08-02 DIAGNOSIS — Z79899 Other long term (current) drug therapy: Secondary | ICD-10-CM | POA: Diagnosis not present

## 2016-08-02 DIAGNOSIS — R040 Epistaxis: Secondary | ICD-10-CM | POA: Insufficient documentation

## 2016-08-02 DIAGNOSIS — I10 Essential (primary) hypertension: Secondary | ICD-10-CM | POA: Insufficient documentation

## 2016-08-02 LAB — CBC WITH DIFFERENTIAL/PLATELET
BASOS ABS: 0 10*3/uL (ref 0.0–0.1)
Basophils Relative: 0 %
EOS PCT: 0 %
Eosinophils Absolute: 0 10*3/uL (ref 0.0–0.7)
HCT: 40.2 % (ref 36.0–46.0)
Hemoglobin: 13.8 g/dL (ref 12.0–15.0)
LYMPHS PCT: 18 %
Lymphs Abs: 2.1 10*3/uL (ref 0.7–4.0)
MCH: 32.1 pg (ref 26.0–34.0)
MCHC: 34.3 g/dL (ref 30.0–36.0)
MCV: 93.5 fL (ref 78.0–100.0)
MONO ABS: 0.7 10*3/uL (ref 0.1–1.0)
Monocytes Relative: 6 %
Neutro Abs: 9 10*3/uL — ABNORMAL HIGH (ref 1.7–7.7)
Neutrophils Relative %: 76 %
PLATELETS: 285 10*3/uL (ref 150–400)
RBC: 4.3 MIL/uL (ref 3.87–5.11)
RDW: 12.9 % (ref 11.5–15.5)
WBC: 11.8 10*3/uL — ABNORMAL HIGH (ref 4.0–10.5)

## 2016-08-02 LAB — COMPREHENSIVE METABOLIC PANEL
ALT: 12 U/L — ABNORMAL LOW (ref 14–54)
ANION GAP: 9 (ref 5–15)
AST: 15 U/L (ref 15–41)
Albumin: 4.1 g/dL (ref 3.5–5.0)
Alkaline Phosphatase: 67 U/L (ref 38–126)
BUN: 18 mg/dL (ref 6–20)
CHLORIDE: 96 mmol/L — AB (ref 101–111)
CO2: 23 mmol/L (ref 22–32)
Calcium: 9.4 mg/dL (ref 8.9–10.3)
Creatinine, Ser: 0.81 mg/dL (ref 0.44–1.00)
Glucose, Bld: 115 mg/dL — ABNORMAL HIGH (ref 65–99)
POTASSIUM: 3.9 mmol/L (ref 3.5–5.1)
Sodium: 128 mmol/L — ABNORMAL LOW (ref 135–145)
Total Bilirubin: 0.4 mg/dL (ref 0.3–1.2)
Total Protein: 7.3 g/dL (ref 6.5–8.1)

## 2016-08-02 LAB — TROPONIN I: TROPONIN I: 0.03 ng/mL — AB (ref <0.03)

## 2016-08-02 NOTE — Telephone Encounter (Signed)
      Patient called on call provider. She had a nosebleed out of the blue. She then felt flushed and "weird" and took her BP. This was up 194/101 and her HR went up to 122. Now BP and pulse going back to normal (SBP 167 and HR 100) and she is feeling better. No CP or SOB. No dizziness or syncope. She was just nervous and wanted to make sure she didn't need to do anything. I think she became upset with nosebleed and HR and BP went up appropriately. She will call us back if she feels bad again or BP does not go down. She had a hx of PAF but she says her HR feels regular. I advised her to be seen in the office tomorrow if she has anymore issues.   Angelena Form PA-C  [facility]

## 2016-08-02 NOTE — Telephone Encounter (Signed)
Thank you we will call and check on her.

## 2016-08-02 NOTE — Telephone Encounter (Signed)
Received telephone call through the answering service while covering part of the fellow on-call shift this evening. I reviewed the chart. Patient had called earlier this afternoon and spoke with Ms. Grandville Silos PA-C regarding recent nosebleed and also elevations in blood pressure and heart rate with suspected return of atrial fibrillation. She is a patient of Dr. Harl Bowie, seen recently in the office in July with a history of PAF, reportedly last episode in January of last year. She is on Eliquis, Cartia XT, and Cozaar. Reports compliance with her medications. She called back indicating that she continued to have symptoms of rapid palpitations and fluctuating heart rate, was not feeling well with this. I recommended that she be seen in the ER for further evaluation. She stated that she would have her daughter take her to [facility] which was closest to her home.

## 2016-08-02 NOTE — ED Notes (Signed)
CRITICAL VALUE ALERT  Critical value received: troponin- 0.03  Date of notification:  08/02/16  Time of notification:  2032  Critical value read back:Yes.    Nurse who received alert:  Idelia Salm, RN  MD notified (1st page):  2032  Time of first page:  2032  MD notified (2nd page):  Time of second page:  Responding MD:  Dr Christy Gentles  Time MD responded:  2033

## 2016-08-02 NOTE — ED Provider Notes (Signed)
[place] DEPT Provider Note   CSN: [id number] Arrival date & time: 08/02/16  1913     History   Chief Complaint Chief Complaint  Patient presents with  . Atrial Fibrillation    HPI Monique James is a 75 y.o. female.  The history is provided by the patient and a relative.  Patient reports about 4 hours prior to arrival she had onset of nosebleed - she checked her blood pressure and it was elevated.  She also noted that her heart rate was elevated.  The nose bleed improved and she feels her heart rate improved (HR was around 140s)  She is now improved.  She reports taking medicine for anxiety tonight and is now improved. Nothing worsens her symptoms  She denies cp/sob No LOC No palpitations She been med complaint - she has h/o paroxysmal atrial fibrillation and is currently on eliquis.   Past Medical History:  Diagnosis Date  . Anxiety   . Atrial fibrillation with rapid ventricular response ([place]) 11/05/2014   New dx  . 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 Shaune Leeks)  . Colon polyps    Dr. Oletta Lamas  . Colon polyps    path was small leiomyoma  . Constipation   . Gout    history of (1985ish)  . Hemorrhoids   . History of IBS   . Hx of pelvic mass    complex, benign pathology  . Hyperlipidemia    elevated trigs and LDL  . Hypertension   . Hyponatremia    mild  . Macular degeneration of right eye    Dr. Mikey Bussing (mild)--MISDIAGNOSED; 12/2013 told scarring, NOT macular degeneration  . Osteopenia    mild (T-1.3 in 01/2014)  . Shingles 02/2015    Patient Active Problem List   Diagnosis Date Noted  . Generalized anxiety disorder 08/26/2015  . 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 Atrial fibrillation with rapid ventricular response ([place]) 11/05/2014  . Hypertension 11/05/2014  . Obesity 11/05/2014  . A-fib ([place]) 11/05/2014  . Obesity (BMI 30-39.9) 02/08/2014  . Pure hypercholesterolemia 02/02/2013  . Essential hypertension, benign 07/30/2011  .  Hyponatremia 07/30/2011    Past Surgical History:  Procedure Laterality Date  . ABDOMINAL HYSTERECTOMY  1970   for perforated uterus from IUD  . BILATERAL SALPINGOOPHORECTOMY  2005   benign tumor/cyst (Dr. Aldean Ast)    OB History    Gravida Para Term Preterm AB Living   _0 SAB TAB Ectopic Multiple Live Births   1              Obstetric Comments   1 set of twins; 3 pregnancies, 1 miscarriage       Home Medications    Prior to Admission medications   Medication Sig Start Date End Date Taking? Authorizing Provider  Calcium Carbonate-Vitamin D (CALCIUM 600+D) 600-400 MG-UNIT per tablet Take 1 tablet by mouth daily.     Historical Provider, MD  CARTIA XT 180 MG 24 hr capsule TAKE ONE CAPSULE BY MOUTH ONCE DAILY --  MEDICATION  TO  CONTROL  YOUR  HEART  RATE 02/24/16   Lendon Colonel, NP  citalopram (CELEXA) 20 MG tablet Take 1 tablet (20 mg total) by mouth daily. 10/07/15   Rita Ohara, MD  diphenhydrAMINE (BENADRYL) 25 MG tablet Take 25 mg by mouth every 6 (six) hours as needed.    Historical Provider, MD  ELIQUIS 5 MG TABS tablet TAKE  ONE TABLET BY MOUTH TWICE DAILY (BLOOD THINNER) 11/19/15   Lendon Colonel, NP  EPINEPHrine (EPIPEN 2-PAK) 0.3 mg/0.3 mL IJ SOAJ injection Inject 0.3 mg into the muscle once. Reported on 03/09/2016    Historical Provider, MD  fexofenadine (ALLEGRA) 180 MG tablet Take 180 mg by mouth daily as needed for allergies or rhinitis. Reported on 02/03/2016    Historical Provider, MD  [place] Take 1 each by mouth daily.    Historical Provider, MD  losartan (COZAAR) 25 MG tablet TAKE ONE TABLET BY MOUTH ONCE DAILY 02/24/16   Lendon Colonel, NP  Multiple Vitamins-Minerals (MULTIVITAMIN WITH MINERALS) tablet Take 1 tablet by mouth daily.      Historical Provider, MD    Family History Family History  Problem Relation Age of Onset  . Hypertension Mother   . Heart disease Mother     MI  . Dementia Mother   . Cancer Father      stomach  . Cancer Brother     multiple myeloma  . Healthy Daughter   . Healthy Daughter   . Healthy Daughter   . Diabetes Neg Hx   . Breast cancer Neg Hx   . Colon cancer Neg Hx     Social History Social History  Substance Use Topics  . Smoking status: Never Smoker  . Smokeless tobacco: Never Used  . Alcohol use 8.4 oz/week    14 Glasses of wine per week     Comment: 1 glass of wine daily, occasionally 2     Allergies   Bee venom; Epinephrine; Sulfa antibiotics; Tetanus toxoids; Vancomycin; Adhesive [tape]; Influenza vaccine live; Ivp dye [iodinated diagnostic agents]; and Zyloprim [allopurinol]   Review of Systems Review of Systems  Constitutional: Negative for fever.  HENT: Positive for nosebleeds.   Respiratory: Negative for shortness of breath.   Cardiovascular: Negative for chest pain and palpitations.  Gastrointestinal: Negative for vomiting.  Neurological: Negative for syncope.  All other systems reviewed and are negative.    Physical Exam Updated Vital Signs BP 182/82 (BP Location: Left Arm)   Pulse 99   Temp 97.5 F (36.4 C) (Oral)   Resp 18   Ht 5' 5" (1.651 m)   Wt 89.4 kg   SpO2 97%   BMI 32.78 kg/m   Physical Exam CONSTITUTIONAL: Well developed/well nourished HEAD: Normocephalic/atraumatic EYES: EOMI/PERRL ENMT: Mucous membranes moist, no blood noted at either nare, no blood in oropharynx NECK: supple no meningeal signs SPINE/BACK:entire spine nontender CV: S1/S2 noted, no murmurs/rubs/gallops noted LUNGS: Lungs are clear to auscultation bilaterally, no apparent distress ABDOMEN: soft, nontender NEURO: Pt is awake/alert/appropriate, moves all extremitiesx4.  No facial droop.   EXTREMITIES: pulses normal/equal, full ROM SKIN: warm, color normal PSYCH: no abnormalities of mood noted, alert and oriented to situation   ED Treatments / Results  Labs (all labs ordered are listed, but only abnormal results are displayed) Labs Reviewed  CBC  WITH DIFFERENTIAL/PLATELET - Abnormal; Notable for the following:       Result Value   WBC 11.8 (*)    Neutro Abs 9.0 (*)    All other components within normal limits  COMPREHENSIVE METABOLIC PANEL - Abnormal; Notable for the following:    Sodium 128 (*)    Chloride 96 (*)    Glucose, Bld 115 (*)    ALT 12 (*)    All other components within normal limits  TROPONIN I - Abnormal; Notable for the following:    Troponin  I 0.03 (*)    All other components within normal limits    EKG ED ECG REPORT   Date: 08/02/2016 1942  Rate: 92  Rhythm: normal sinus rhythm  QRS Axis: normal  Intervals: normal  ST/T Wave abnormalities: normal  Conduction Disutrbances:none  Narrative Interpretation:    I have personally reviewed the EKG tracing and agree with the computerized printout as noted.  Radiology No results found.  Procedures Procedures (including critical care time)  Medications Ordered in ED Medications - No data to display   Initial Impression / Assessment and Plan / ED Course  I have reviewed the triage vital signs and the nursing notes.  Pertinent labs results that were available during my care of the patient were reviewed by me and considered in my medical decision making (see chart for details).  Clinical Course    Pt well appearing She had episode of elevated BP and heart rate earlier, now improved No cp/sob reported She is currently in sinus rhythm She is requesting d/c home as she feels at baseline Her troponin was mildly elevated but suspect this could be due to elevated heart rate earlier.  I doubt ACS She will call her cardiologist tomorrow for followup and to assess if she needs any new BP meds   Final Clinical Impressions(s) / ED Diagnoses   Final diagnoses:  Epistaxis  Essential hypertension    New Prescriptions New Prescriptions   No medications on file     Ripley Fraise, MD 08/02/16 2117

## 2016-08-02 NOTE — ED Triage Notes (Addendum)
Pt states that around 3 pm she had a nosebleed and she checked her BP and it was 184/90. Pt states her heart rate has bee up to 146 and down to the 80's. Pt states I just feel out of sorts right now. Pt was advised by Dr. Domenic Polite to come to the ED and get checked out due to her heart rate being irregular. Pt denies any pain and has not had any chest pain today.

## 2016-08-04 ENCOUNTER — Ambulatory Visit (INDEPENDENT_AMBULATORY_CARE_PROVIDER_SITE_OTHER): Payer: Medicare Other | Admitting: Adult Health

## 2016-08-04 ENCOUNTER — Encounter: Payer: Self-pay | Admitting: Adult Health

## 2016-08-04 ENCOUNTER — Telehealth: Payer: Self-pay | Admitting: *Deleted

## 2016-08-04 VITALS — BP 142/84 | HR 85 | Ht 65.0 in | Wt 199.0 lb

## 2016-08-04 DIAGNOSIS — I480 Paroxysmal atrial fibrillation: Secondary | ICD-10-CM

## 2016-08-04 DIAGNOSIS — I10 Essential (primary) hypertension: Secondary | ICD-10-CM

## 2016-08-04 MED ORDER — APIXABAN 5 MG PO TABS: 5.0000 mg | ORAL_TABLET | Freq: Two times a day (BID) | ORAL | 6 refills | Status: DC

## 2016-08-04 NOTE — Progress Notes (Signed)
Name: Monique James    DOB: [birth year]  Age: 75 y.o.  MR#: [id number]       PCP:  Vikki Ports, MD      Insurance: Payor: [employer] MEDICARE / Plan: [facility] MEDICARE / Product Type: *No Product type* /   CC:   No chief complaint on file.   VS Vitals:   08/04/16 1329  BP: (!) 142/84  Pulse: 85  SpO2: 97%  Weight: 199 lb (90.3 kg)  Height: 5\' 5"  (1.651 m)    Weights Current Weight  08/04/16 199 lb (90.3 kg)  08/02/16 197 lb (89.4 kg)  06/30/16 201 lb (91.2 kg)    Blood Pressure  BP Readings from Last 3 Encounters:  08/04/16 (!) 142/84  08/02/16 152/84  06/30/16 130/82     Admit date:  (Not on file) Last encounter with RMR:  05/25/2016   Allergy Bee venom; Epinephrine; Sulfa antibiotics; Tetanus toxoids; Vancomycin; Adhesive [tape]; Influenza vaccine live; Ivp dye [iodinated diagnostic agents]; and Zyloprim [allopurinol]  Current Outpatient Prescriptions  Medication Sig Dispense Refill  . Calcium Carbonate-Vitamin D (CALCIUM 600+D) 600-400 MG-UNIT per tablet Take 1 tablet by mouth daily.     [name] Kitchen CARTIA XT 180 MG 24 hr capsule TAKE ONE CAPSULE BY MOUTH ONCE DAILY --  MEDICATION  TO  CONTROL  YOUR  HEART  RATE 90 capsule 1  . citalopram (CELEXA) 20 MG tablet Take 1 tablet (20 mg total) by mouth daily. 90 tablet 3  . diphenhydrAMINE (BENADRYL) 25 MG tablet Take 25 mg by mouth every 6 (six) hours as needed.    [name] Kitchen ELIQUIS 5 MG TABS tablet TAKE ONE TABLET BY MOUTH TWICE DAILY (BLOOD THINNER) 60 tablet 6  . EPINEPHrine (EPIPEN 2-PAK) 0.3 mg/0.3 mL IJ SOAJ injection Inject 0.3 mg into the muscle once. Reported on 03/09/2016    . fexofenadine (ALLEGRA) 180 MG tablet Take 180 mg by mouth daily as needed for allergies or rhinitis. Reported on 02/03/2016    . FIBER SELECT GUMMIES PO Take 1 each by mouth daily.    [name] Kitchen losartan (COZAAR) 25 MG tablet TAKE ONE TABLET BY MOUTH ONCE DAILY 90 tablet 1  . Multiple Vitamins-Minerals (MULTIVITAMIN WITH MINERALS) tablet Take 1 tablet by mouth daily.        No current facility-administered medications for this visit.     Discontinued Meds:   There are no discontinued medications.  Patient Active Problem List   Diagnosis Date Noted  . Generalized anxiety disorder 08/26/2015  . 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 Atrial fibrillation with rapid ventricular response ([place]) 11/05/2014  . Hypertension 11/05/2014  . Obesity 11/05/2014  . A-fib ([place]) 11/05/2014  . Obesity (BMI 30-39.9) 02/08/2014  . Pure hypercholesterolemia 02/02/2013  . Essential hypertension, benign 07/30/2011  . Hyponatremia 07/30/2011    LABS    Component Value Date/Time   NA 128 (L) 08/02/2016 1950   NA 133 (L) 03/09/2016 1001   NA 128 (L) 08/05/2015 2004   K 3.9 08/02/2016 1950   K 4.3 03/09/2016 1001   K 3.7 08/05/2015 2004   CL 96 (L) 08/02/2016 1950   CL 97 (L) 03/09/2016 1001   CL 96 (L) 08/05/2015 2004   CO2 23 08/02/2016 1950   CO2 23 03/09/2016 1001   CO2 24 08/05/2015 2004   GLUCOSE 115 (H) 08/02/2016 1950   GLUCOSE 87 03/09/2016 1001   GLUCOSE 130 (H) 08/05/2015 2004   BUN 18 08/02/2016 1950   BUN 14 03/09/2016 1001  BUN 15 08/05/2015 2004   CREATININE 0.81 08/02/2016 1950   CREATININE 0.75 03/09/2016 1001   CREATININE 0.94 08/05/2015 2004   CREATININE 0.76 02/14/2015 0001   CREATININE 0.86 11/06/2014 0430   CREATININE 0.83 02/08/2014 1018   CALCIUM 9.4 08/02/2016 1950   CALCIUM 9.7 03/09/2016 1001   CALCIUM 8.8 (L) 08/05/2015 2004   GFRNONAA >60 08/02/2016 1950   GFRNONAA 58 (L) 08/05/2015 2004   GFRNONAA 65 (L) 11/06/2014 0430   GFRAA >60 08/02/2016 1950   GFRAA >60 08/05/2015 2004   GFRAA 75 (L) 11/06/2014 0430   CMP     Component Value Date/Time   NA 128 (L) 08/02/2016 1950   K 3.9 08/02/2016 1950   CL 96 (L) 08/02/2016 1950   CO2 23 08/02/2016 1950   GLUCOSE 115 (H) 08/02/2016 1950   BUN 18 08/02/2016 1950   CREATININE 0.81 08/02/2016 1950   CREATININE 0.75 03/09/2016 1001   CALCIUM 9.4 08/02/2016 1950   PROT 7.3  08/02/2016 1950   ALBUMIN 4.1 08/02/2016 1950   AST 15 08/02/2016 1950   ALT 12 (L) 08/02/2016 1950   ALKPHOS 67 08/02/2016 1950   BILITOT 0.4 08/02/2016 1950   GFRNONAA >60 08/02/2016 1950   GFRAA >60 08/02/2016 1950       Component Value Date/Time   WBC 11.8 (H) 08/02/2016 1950   WBC 7.3 03/09/2016 1001   WBC 9.5 08/05/2015 2004   HGB 13.8 08/02/2016 1950   HGB 14.2 03/09/2016 1001   HGB 13.7 08/05/2015 2004   HCT 40.2 08/02/2016 1950   HCT 42.5 03/09/2016 1001   HCT 39.7 08/05/2015 2004   MCV 93.5 08/02/2016 1950   MCV 92.4 03/09/2016 1001   MCV 94.1 08/05/2015 2004    Lipid Panel     Component Value Date/Time   CHOL 224 (H) 03/09/2016 1001   TRIG 63 03/09/2016 1001   HDL 89 03/09/2016 1001   CHOLHDL 2.5 03/09/2016 1001   VLDL 13 03/09/2016 1001   LDLCALC 122 03/09/2016 1001    ABG No results found for: PHART, PCO2ART, PO2ART, HCO3, TCO2, ACIDBASEDEF, O2SAT   Lab Results  Component Value Date   TSH 2.15 03/09/2016   BNP (last 3 results) No results for input(s): BNP in the last 8760 hours.  ProBNP (last 3 results) No results for input(s): PROBNP in the last 8760 hours.  Cardiac Panel (last 3 results)  Recent Labs  08/02/16 1950  TROPONINI 0.03*    Iron/TIBC/Ferritin/ %Sat No results found for: IRON, TIBC, FERRITIN, IRONPCTSAT   EKG Orders placed or performed during the hospital encounter of 08/02/16  . ED EKG  . ED EKG  . EKG 12-Lead  . EKG 12-Lead  . EKG 12-Lead  . EKG 12-Lead  . EKG 12-Lead  . EKG 12-Lead  . EKG     Prior Assessment and Plan Problem List as of 08/04/2016 Reviewed: 06/30/2016 11:55 AM by Edmonia Lynch, MD     Cardiovascular and Mediastinum   Hypertension   Essential hypertension, benign   Atrial fibrillation with rapid ventricular response [facility])   Last Assessment & Plan 11/30/2014 Office Visit Written 11/30/2014  2:10 PM by Lendon Colonel, NP    She remains in normal sinus rhythm and is asymptomatic concerning side  effects of medications. She will stay on Eliquis as directed, CHADS Score of 3. Will continue current regimen. She will be seen again in 6 months unless symptomatic. She is to avoid caffeine.      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    A refill prescription has been provided for epinephrine 0.3 mg autoinjector 2 pack.  Emergency allergy action plan is in place.      Generalized anxiety disorder       Imaging: No results found.

## 2016-08-04 NOTE — Telephone Encounter (Signed)
-----   Message from Lendon Colonel, NP sent at 08/02/2016  5:35 PM EDT ----- Please call and check on this patient.  ----- Message ----- From: Eileen Stanford, PA-C Sent: 08/02/2016   4:15 PM To: Lendon Colonel, NP  FYI. She is sweet :)

## 2016-08-04 NOTE — Telephone Encounter (Signed)
Spoke with Pt who states she is better.

## 2016-08-04 NOTE — Patient Instructions (Signed)
Your physician recommends that you schedule a follow-up appointment in: January with Dr. Harl Bowie   Your physician recommends that you continue on your current medications as directed. Please refer to the Current Medication list given to you today.  I have refilled your Eliquis   If you need a refill on your cardiac medications before your next appointment, please call your pharmacy.  Thank you for choosing [facility]!

## 2016-08-04 NOTE — Progress Notes (Signed)
Cardiology Office Note   Date:  08/04/2016   ID:  Monique James, DOB 21-Oct-1941, MRN [id number]  PCP:  Vikki Ports, MD  Cardiologist: Cloria Spring, NP   Chief Complaint  Patient presents with  . Atrial Fibrillation    PAF      History of Present Illness: Monique James is a 75 y.o. female who presents for ongoing assessment and management of atrial fibrillation, CHADS VASC score of 3 on ELIQUIS, hypertension, hypercholesterolemia, with history of obesity and anxiety. She was last seen in the office on 05/25/2016, she was clinically stable. She is here for 6 month follow-up.  She was seen in the emergency room because of epistaxis on. 08/02/2016. She is found to be hypertensive and slightly tachycardic. She believes this is related to anxiety by having nosebleed. She is not found be anemic.  Past Medical History:  Diagnosis Date  . Anxiety   . Atrial fibrillation with rapid ventricular response ([place]) 11/05/2014   New dx  . 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 Shaune Leeks)  . Colon polyps    Dr. Oletta Lamas  . Colon polyps    path was small leiomyoma  . Constipation   . Gout    history of (1985ish)  . Hemorrhoids   . History of IBS   . Hx of pelvic mass    complex, benign pathology  . Hyperlipidemia    elevated trigs and LDL  . Hypertension   . Hyponatremia    mild  . Macular degeneration of right eye    Dr. Mikey Bussing (mild)--MISDIAGNOSED; 12/2013 told scarring, NOT macular degeneration  . Osteopenia    mild (T-1.3 in 01/2014)  . Shingles 02/2015    Past Surgical History:  Procedure Laterality Date  . ABDOMINAL HYSTERECTOMY  1970   for perforated uterus from IUD  . BILATERAL SALPINGOOPHORECTOMY  2005   benign tumor/cyst (Dr. Aldean Ast)     Current Outpatient Prescriptions  Medication Sig Dispense Refill  . apixaban (ELIQUIS) 5 MG TABS tablet Take 1 tablet (5 mg total) by mouth 2 (two) times daily. 60 tablet 6  . Calcium Carbonate-Vitamin D  (CALCIUM 600+D) 600-400 MG-UNIT per tablet Take 1 tablet by mouth daily.     [name] Kitchen CARTIA XT 180 MG 24 hr capsule TAKE ONE CAPSULE BY MOUTH ONCE DAILY --  MEDICATION  TO  CONTROL  YOUR  HEART  RATE 90 capsule 1  . citalopram (CELEXA) 20 MG tablet Take 1 tablet (20 mg total) by mouth daily. 90 tablet 3  . diphenhydrAMINE (BENADRYL) 25 MG tablet Take 25 mg by mouth every 6 (six) hours as needed.    [name] Kitchen EPINEPHrine (EPIPEN 2-PAK) 0.3 mg/0.3 mL IJ SOAJ injection Inject 0.3 mg into the muscle once. Reported on 03/09/2016    . fexofenadine (ALLEGRA) 180 MG tablet Take 180 mg by mouth daily as needed for allergies or rhinitis. Reported on 02/03/2016    . FIBER SELECT GUMMIES PO Take 1 each by mouth daily.    [name] Kitchen losartan (COZAAR) 25 MG tablet TAKE ONE TABLET BY MOUTH ONCE DAILY 90 tablet 1  . Multiple Vitamins-Minerals (MULTIVITAMIN WITH MINERALS) tablet Take 1 tablet by mouth daily.       No current facility-administered medications for this visit.     Allergies:   Bee venom; Epinephrine; Sulfa antibiotics; Tetanus toxoids; Vancomycin; Adhesive [tape]; Influenza vaccine live; Ivp dye [iodinated diagnostic agents]; and Zyloprim [allopurinol]    Social History:  The patient  reports  that she has never smoked. She has never used smokeless tobacco. She reports that she drinks about 8.4 oz of alcohol per week . She reports that she does not use drugs.   Family History:  The patient's family history includes Cancer in her brother and father; Dementia in her mother; Healthy in her daughter, daughter, and daughter; Heart disease in her mother; Hypertension in her mother.    ROS: All other systems are reviewed and negative. Unless otherwise mentioned in H&P    PHYSICAL EXAM: VS:  BP (!) 142/84   Pulse 85   Ht 5\' 5"  (1.651 m)   Wt 199 lb (90.3 kg)   SpO2 97%   BMI 33.12 kg/m  , BMI Body mass index is 33.12 kg/m. GEN: Well nourished, well developed, in no acute distress  HEENT: normal  Neck: no JVD, carotid  bruits, or masses Cardiac: RRR; no murmurs, rubs, or gallops,no edema  Respiratory:  clear to auscultation bilaterally, normal work of breathing GI: soft, nontender, nondistended, + BS MS: no deformity or atrophy  Skin: warm and dry, no rash Neuro:  Strength and sensation are intact Psych: euthymic mood, full affect   Recent Labs: 03/09/2016: TSH 2.15 08/02/2016: ALT 12; BUN 18; Creatinine, Ser 0.81; Hemoglobin 13.8; Platelets 285; Potassium 3.9; Sodium 128    Lipid Panel    Component Value Date/Time   CHOL 224 (H) 03/09/2016 1001   TRIG 63 03/09/2016 1001   HDL 89 03/09/2016 1001   CHOLHDL 2.5 03/09/2016 1001   VLDL 13 03/09/2016 1001   LDLCALC 122 03/09/2016 1001      Wt Readings from Last 3 Encounters:  08/04/16 199 lb (90.3 kg)  08/02/16 197 lb (89.4 kg)  06/30/16 201 lb (91.2 kg)      Other studies Reviewed: Additional studies/ records that were reviewed today include:  Review of the above records demonstrates:   ASSESSMENT AND PLAN:  1.  Atrial fib: Heart rate continues well controlled. On episode of epistaxis requiring ER visits. She was Seen in the ER and was found to be mildly tachycardic. She was very anxious. She was not found to be anemic. Nosebleed was resolved. Will not make any changes in her medication regimen at this time. Reassurance is given. Heart rate was normal rhythm on auscultation. We'll plan on discussing stopping ELIQUIS on follow-up visit if she remains in normal sinus rhythm..  2. Hypertension: Well-controlled today. Was elevated in the emergency room. This I believe is related to anxiety. The patient freely states that she was upset and nervous about the nosebleed. She also states she is easily anxious. Blood pressure is mildly elevated at home. Today it is normal. Will not make any changes continue losartan.  No new labs as they were just completed last week in the ER and she was stable.   Current medicines are reviewed at length with the  patient today.    Labs/ tests ordered today include:  No orders of the defined types were placed in this encounter.    Disposition:   FU in January 2018 as previously planned.    Signed, Jory Sims, NP  08/04/2016 4:32 PM    [facility] [address], [place], [place] [address] Phone: [phone]; Fax: [phone]

## 2016-08-24 ENCOUNTER — Other Ambulatory Visit: Payer: Self-pay | Admitting: Adult Health

## 2016-09-04 ENCOUNTER — Other Ambulatory Visit: Payer: Self-pay | Admitting: Family Medicine

## 2016-09-04 DIAGNOSIS — Z1231 Encounter for screening mammogram for malignant neoplasm of breast: Secondary | ICD-10-CM

## 2016-09-10 ENCOUNTER — Ambulatory Visit (INDEPENDENT_AMBULATORY_CARE_PROVIDER_SITE_OTHER): Payer: Medicare Other | Admitting: *Deleted

## 2016-09-10 DIAGNOSIS — T63441D Toxic effect of venom of bees, accidental (unintentional), subsequent encounter: Secondary | ICD-10-CM

## 2016-09-11 ENCOUNTER — Telehealth: Payer: Self-pay | Admitting: Cardiology

## 2016-09-11 NOTE — Telephone Encounter (Signed)
Will offer samples,lot [id number] exp 01/2019

## 2016-09-11 NOTE — Telephone Encounter (Signed)
Pt is in the doughnut hole w/ her Eluiquis, would like to know if she can have some samples

## 2016-09-20 NOTE — Progress Notes (Signed)
Chief Complaint  Patient presents with  . Hypertension    fasting med check. no concerns. /RLB    Anxiety--related to diagnosis of atrial fibrillation.  On citalopram and doing very well. Not needing any alprazolam. Denies side effects to citalopram. Anxiety was triggered with nosebleed (causing very high BP, related to anxiety), so prefers to stay on this.  Atrial fibrillation--diagnosed 11/2014. CHADS VASC score of 3 on ELIQUIS. Last saw cardiology in early October, after she had epistaxis that required ER visit. Denies any palpitations, tachycardia or any further bleeding.  Hasn't been in afib, and mentioned considering coming of eliquis next year.  Hit chin on doorframe last night--her dog (boxer) pulled her (when chasing a possum). Other than the nosebleed last month, no significant bleeding or bruising.  Hypertension--currently well controlled on Cartia (previously was on higher dose of losartan for HTN, decreased as Ca blocker adder with afib dx).  She checks BP's infrequently, and is 120's/70's.  Was only high the day she had her nosebleed.  Hyperlipidemia: Controlled by diet, which remains the same--she has continued to limit her cheese intake, continuing to eat more fruits and vegetables. She cut out the creamy dressings. She mostly eats chicken and some pork, only has a burger on the grill once a month, if even that often. Has tuna without mayonnaise (uses vinegar). Lab Results  Component Value Date   CHOL 224 (H) 03/09/2016   HDL 89 03/09/2016   LDLCALC 122 03/09/2016   TRIG 63 03/09/2016   CHOLHDL 2.5 03/09/2016    Vitamin D deficiency:  Last level was 28 at her May 2017 visit. She has been compliant with taking MVI daily since last visit.  Allergies:  Taking Allegra daily, having lots of PND. She continues on allergy shots every 2 months.  PMH, [place], SH reviewed  Outpatient Encounter Prescriptions as of 09/21/2016  Medication Sig Note  . apixaban (ELIQUIS) 5 MG TABS  tablet Take 1 tablet (5 mg total) by mouth 2 (two) times daily.   . Calcium Carbonate-Vitamin D (CALCIUM 600+D) 600-400 MG-UNIT per tablet Take 1 tablet by mouth daily.    [name] Kitchen CARTIA XT 180 MG 24 hr capsule TAKE ONE CAPSULE BY MOUTH ONCE DAILY ** MEDICATION TO CONTROL YOUR HEART RATE **   . citalopram (CELEXA) 20 MG tablet Take 1 tablet (20 mg total) by mouth daily.   . diphenhydrAMINE (BENADRYL) 25 MG tablet Take 25 mg by mouth every 6 (six) hours as needed.   [name] Kitchen EPINEPHrine (EPIPEN 2-PAK) 0.3 mg/0.3 mL IJ SOAJ injection Inject 0.3 mg into the muscle once. Reported on 03/09/2016   . fexofenadine (ALLEGRA) 180 MG tablet Take 180 mg by mouth daily as needed for allergies or rhinitis. Reported on 02/03/2016 03/09/2016: Takes on the day of her allergy shots and prn allergy symptoms  . FIBER SELECT GUMMIES PO Take 1 each by mouth daily.   [name] Kitchen losartan (COZAAR) 25 MG tablet TAKE ONE TABLET BY MOUTH ONCE DAILY   . Multiple Vitamins-Minerals (MULTIVITAMIN WITH MINERALS) tablet Take 1 tablet by mouth daily.      No facility-administered encounter medications on file as of 09/21/2016.    Allergies  Allergen Reactions  . Bee Venom Anaphylaxis  . Epinephrine Other (See Comments)    High pulse rate.  . Sulfa Antibiotics Nausea And Vomiting  . Tetanus Toxoids Swelling    Entire arm swelled  . Vancomycin Nausea And Vomiting  . Adhesive [Tape] Rash    Her skin peels off.  [name] Kitchen  Influenza Vaccine Live Swelling and Rash  . Ivp Dye [Iodinated Diagnostic Agents] Rash  . Zyloprim [Allopurinol] Rash    ROS:  Denies fever, chills, headaches, dizziness, chest pain, palpitations, shortness of breath, cough.  +allergies/postnasal drainage.  Denies nausea, vomiting, heartburn, abdominal pain, bowel changes, urinary complaints, depression, insomnia.  Denies joint pains (slight in hands).  PHYSICAL EXAM:  BP 122/70   Pulse 62   Resp 16   Ht '5\' 5"'  (1.651 m)   Wt 202 lb 9.6 oz (91.9 kg)   BMI 33.71 kg/m   Well  developed, pleasant, obese female in no distress HEENT: PERRL, EOMI, conjunctiva and sclera are clear. Bruising noted on the inferior portion of left chin. Otherwise atraumatic Neck: no lymphadenopathy, thyromegaly or mass Heart: regular rate and rhythm.  One ectopic beat noted, otherwise very regular.  Lungs: clear bilaterally Abdomen: obese, soft, nontender Extremities: no edema Psych: normal mood, affect, hygiene and grooming Neuro: alert and oriented, cranial nerves intact, normal gait.  ASSESSMENT/PLAN:  Essential hypertension - controlled - Plan: Basic metabolic panel  Pure hypercholesterolemia - controlled with diet--continue healthy, lowfat diet  Vitamin D deficiency - Plan: VITAMIN D 25 Hydroxy (Vit-D Deficiency, Fractures)  Atrial fibrillation with rapid ventricular response (HCC) - currently in NSR  Hyponatremia - chronic, mild/asymptomatic - Plan: Basic metabolic panel  Generalized anxiety disorder - well controlled - Plan: citalopram (CELEXA) 20 MG tablet    Vit D, b-met CBC and c-met done October at hospital Hyponatremia, Na 128, lower than 133 6 months ago.  Repeat today.  F/u 6 months--CPE/AWV/med check

## 2016-09-21 ENCOUNTER — Ambulatory Visit (INDEPENDENT_AMBULATORY_CARE_PROVIDER_SITE_OTHER): Payer: Medicare Other | Admitting: Family Medicine

## 2016-09-21 ENCOUNTER — Encounter: Payer: Self-pay | Admitting: Family Medicine

## 2016-09-21 VITALS — BP 122/70 | HR 62 | Resp 16 | Ht 65.0 in | Wt 202.6 lb

## 2016-09-21 DIAGNOSIS — F411 Generalized anxiety disorder: Secondary | ICD-10-CM | POA: Diagnosis not present

## 2016-09-21 DIAGNOSIS — E559 Vitamin D deficiency, unspecified: Secondary | ICD-10-CM | POA: Diagnosis not present

## 2016-09-21 DIAGNOSIS — I10 Essential (primary) hypertension: Secondary | ICD-10-CM

## 2016-09-21 DIAGNOSIS — E7800 Pure hypercholesterolemia, unspecified: Secondary | ICD-10-CM | POA: Diagnosis not present

## 2016-09-21 DIAGNOSIS — E871 Hypo-osmolality and hyponatremia: Secondary | ICD-10-CM

## 2016-09-21 DIAGNOSIS — I4891 Unspecified atrial fibrillation: Secondary | ICD-10-CM

## 2016-09-21 LAB — BASIC METABOLIC PANEL
BUN: 14 mg/dL (ref 7–25)
CALCIUM: 9.6 mg/dL (ref 8.6–10.4)
CO2: 26 mmol/L (ref 20–31)
CREATININE: 0.8 mg/dL (ref 0.60–0.93)
Chloride: 98 mmol/L (ref 98–110)
GLUCOSE: 103 mg/dL — AB (ref 65–99)
Potassium: 4.3 mmol/L (ref 3.5–5.3)
Sodium: 132 mmol/L — ABNORMAL LOW (ref 135–146)

## 2016-09-21 MED ORDER — CITALOPRAM HYDROBROMIDE 20 MG PO TABS: 20.0000 mg | ORAL_TABLET | Freq: Every day | ORAL | 3 refills | Status: DC

## 2016-09-21 NOTE — Patient Instructions (Signed)
Continue your current medications. Your blood pressure was great, and heart was in regular rhythm.  Please continue to work on weight loss. Get 150 minutes of aerobic exercise each week (plus 2-3 days/week of weight-bearing exercise).

## 2016-09-22 ENCOUNTER — Encounter: Payer: Self-pay | Admitting: Family Medicine

## 2016-09-22 LAB — VITAMIN D 25 HYDROXY (VIT D DEFICIENCY, FRACTURES): Vit D, 25-Hydroxy: 30 ng/mL (ref 30–100)

## 2016-10-02 ENCOUNTER — Ambulatory Visit (HOSPITAL_COMMUNITY)
Admission: RE | Admit: 2016-10-02 | Discharge: 2016-10-02 | Disposition: A | Discharge status: Home / Self Care | Payer: Medicare Other | Source: Ambulatory Visit | Attending: Family Medicine | Admitting: Family Medicine

## 2016-10-02 DIAGNOSIS — Z1231 Encounter for screening mammogram for malignant neoplasm of breast: Secondary | ICD-10-CM | POA: Insufficient documentation

## 2016-11-05 ENCOUNTER — Ambulatory Visit (INDEPENDENT_AMBULATORY_CARE_PROVIDER_SITE_OTHER): Payer: Medicare Other

## 2016-11-05 DIAGNOSIS — T63441D Toxic effect of venom of bees, accidental (unintentional), subsequent encounter: Secondary | ICD-10-CM

## 2016-11-27 ENCOUNTER — Encounter: Payer: Self-pay | Admitting: Cardiology

## 2016-11-27 ENCOUNTER — Ambulatory Visit (INDEPENDENT_AMBULATORY_CARE_PROVIDER_SITE_OTHER): Payer: Medicare Other | Admitting: Cardiology

## 2016-11-27 VITALS — BP 138/78 | HR 98 | Ht 65.0 in | Wt 205.0 lb

## 2016-11-27 DIAGNOSIS — I10 Essential (primary) hypertension: Secondary | ICD-10-CM | POA: Diagnosis not present

## 2016-11-27 DIAGNOSIS — I480 Paroxysmal atrial fibrillation: Secondary | ICD-10-CM | POA: Diagnosis not present

## 2016-11-27 NOTE — Progress Notes (Signed)
Clinical Summary Monique James is a 76 y.o.female seen today for follow up of the following medical problems.   1. Afib - she denies any recent palptitations - compliant with meds   2. HTN - does not check blood pressure at home - compliant with bp meds    Past Medical History:  Diagnosis Date  . Anxiety   . Atrial fibrillation with rapid ventricular response ([place]) 11/05/2014   New dx  . 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 Shaune Leeks)  . Colon polyps    Dr. Oletta Lamas  . Colon polyps    path was small leiomyoma  . Constipation   . Gout    history of (1985ish)  . Hemorrhoids   . History of IBS   . Hx of pelvic mass    complex, benign pathology  . Hyperlipidemia    elevated trigs and LDL  . Hypertension   . Hyponatremia    mild  . Macular degeneration of right eye    Dr. Mikey Bussing (mild)--MISDIAGNOSED; 12/2013 told scarring, NOT macular degeneration  . Osteopenia    mild (T-1.3 in 01/2014)  . Shingles 02/2015     Allergies  Allergen Reactions  . Bee Venom Anaphylaxis  . Epinephrine Other (See Comments)    High pulse rate.  . Sulfa Antibiotics Nausea And Vomiting  . Tetanus Toxoids Swelling    Entire arm swelled  . Vancomycin Nausea And Vomiting  . Adhesive [Tape] Rash    Her skin peels off.  . Influenza Vaccine Live Swelling and Rash  . Ivp Dye [Iodinated Diagnostic Agents] Rash  . Zyloprim [Allopurinol] Rash     Current Outpatient Prescriptions  Medication Sig Dispense Refill  . apixaban (ELIQUIS) 5 MG TABS tablet Take 1 tablet (5 mg total) by mouth 2 (two) times daily. 60 tablet 6  . Calcium Carbonate-Vitamin D (CALCIUM 600+D) 600-400 MG-UNIT per tablet Take 1 tablet by mouth daily.     [name] Kitchen CARTIA XT 180 MG 24 hr capsule TAKE ONE CAPSULE BY MOUTH ONCE DAILY ** MEDICATION TO CONTROL YOUR HEART RATE ** 90 capsule 1  . citalopram (CELEXA) 20 MG tablet Take 1 tablet (20 mg total) by mouth daily. 90 tablet 3  . diphenhydrAMINE (BENADRYL) 25 MG tablet Take  25 mg by mouth every 6 (six) hours as needed.    [name] Kitchen EPINEPHrine (EPIPEN 2-PAK) 0.3 mg/0.3 mL IJ SOAJ injection Inject 0.3 mg into the muscle once. Reported on 03/09/2016    . fexofenadine (ALLEGRA) 180 MG tablet Take 180 mg by mouth daily as needed for allergies or rhinitis. Reported on 02/03/2016    . FIBER SELECT GUMMIES PO Take 1 each by mouth daily.    [name] Kitchen losartan (COZAAR) 25 MG tablet TAKE ONE TABLET BY MOUTH ONCE DAILY 90 tablet 1  . Multiple Vitamins-Minerals (MULTIVITAMIN WITH MINERALS) tablet Take 1 tablet by mouth daily.       No current facility-administered medications for this visit.      Past Surgical History:  Procedure Laterality Date  . ABDOMINAL HYSTERECTOMY  1970   for perforated uterus from IUD  . BILATERAL SALPINGOOPHORECTOMY  2005   benign tumor/cyst (Dr. Aldean Ast)     Allergies  Allergen Reactions  . Bee Venom Anaphylaxis  . Epinephrine Other (See Comments)    High pulse rate.  . Sulfa Antibiotics Nausea And Vomiting  . Tetanus Toxoids Swelling    Entire arm swelled  . Vancomycin Nausea And Vomiting  . Adhesive [Tape]  Rash    Her skin peels off.  . Influenza Vaccine Live Swelling and Rash  . Ivp Dye [Iodinated Diagnostic Agents] Rash  . Zyloprim [Allopurinol] Rash      Family History  Problem Relation Age of Onset  . Hypertension Mother   . Heart disease Mother     MI  . Dementia Mother   . Cancer Father     stomach  . Cancer Brother     multiple myeloma  . Healthy Daughter   . Healthy Daughter   . Healthy Daughter   . Diabetes Neg Hx   . Breast cancer Neg Hx   . Colon cancer Neg Hx      Social History Monique James reports that she has never smoked. She has never used smokeless tobacco. Monique James reports that she drinks about 8.4 oz of alcohol per week .   Review of Systems CONSTITUTIONAL: No weight loss, fever, chills, weakness or fatigue.  HEENT: Eyes: No visual loss, blurred vision, double vision or yellow sclerae.No hearing  loss, sneezing, congestion, runny nose or sore throat.  SKIN: No rash or itching.  CARDIOVASCULAR: per hpi RESPIRATORY: No shortness of breath, cough or sputum.  GASTROINTESTINAL: No anorexia, nausea, vomiting or diarrhea. No abdominal pain or blood.  GENITOURINARY: No burning on urination, no polyuria NEUROLOGICAL: No headache, dizziness, syncope, paralysis, ataxia, numbness or tingling in the extremities. No change in bowel or bladder control.  MUSCULOSKELETAL: No muscle, back pain, joint pain or stiffness.  LYMPHATICS: No enlarged nodes. No history of splenectomy.  PSYCHIATRIC: No history of depression or anxiety.  ENDOCRINOLOGIC: No reports of sweating, cold or heat intolerance. No polyuria or polydipsia.  [name] Kitchen   Physical Examination Vitals:   11/27/16 0810  BP: 138/78  Pulse: 98   Filed Weights   11/27/16 0810  Weight: 205 lb (93 kg)    Gen: resting comfortably, no acute distress HEENT: no scleral icterus, pupils equal round and reactive, no palptable cervical adenopathy,  CV: RRR, no m/r/g, no jvd Resp: Clear to auscultation bilaterally GI: abdomen is soft, non-tender, non-distended, normal bowel sounds, no hepatosplenomegaly MSK: extremities are warm, no edema.  Skin: warm, no rash Neuro:  no focal deficits Psych: appropriate affect   Assessment and Plan  1. Afib - no symptoms - continue current meds - CHADS2Vasc scor eis 4, continue eliquis  2. HTN - at goal, she will continue current meds        Arnoldo Lenis, M.D., F.A.C.C.

## 2016-11-27 NOTE — Patient Instructions (Signed)
Your physician wants you to follow-up in:  6 months Dr Branch You will receive a reminder letter in the mail two months in advance. If you don't receive a letter, please call our office to schedule the follow-up appointment.   Your physician recommends that you continue on your current medications as directed. Please refer to the Current Medication list given to you today.    If you need a refill on your cardiac medications before your next appointment, please call your pharmacy.    Thank you for choosing [facility] !        

## 2016-12-03 DIAGNOSIS — D225 Melanocytic nevi of trunk: Secondary | ICD-10-CM | POA: Diagnosis not present

## 2016-12-03 DIAGNOSIS — D485 Neoplasm of uncertain behavior of skin: Secondary | ICD-10-CM | POA: Diagnosis not present

## 2016-12-03 DIAGNOSIS — Z1283 Encounter for screening for malignant neoplasm of skin: Secondary | ICD-10-CM | POA: Diagnosis not present

## 2016-12-31 ENCOUNTER — Ambulatory Visit (INDEPENDENT_AMBULATORY_CARE_PROVIDER_SITE_OTHER): Payer: Medicare Other | Admitting: *Deleted

## 2016-12-31 DIAGNOSIS — T63441D Toxic effect of venom of bees, accidental (unintentional), subsequent encounter: Secondary | ICD-10-CM | POA: Diagnosis not present

## 2017-02-16 ENCOUNTER — Other Ambulatory Visit: Payer: Self-pay | Admitting: Adult Health

## 2017-02-25 ENCOUNTER — Ambulatory Visit (INDEPENDENT_AMBULATORY_CARE_PROVIDER_SITE_OTHER): Payer: Medicare Other

## 2017-02-25 DIAGNOSIS — T63441D Toxic effect of venom of bees, accidental (unintentional), subsequent encounter: Secondary | ICD-10-CM

## 2017-04-04 NOTE — Progress Notes (Signed)
Chief Complaint  Patient presents with  . Medicare Wellness    fasting AWV/CPE with pelvic. UA showed 2+ blood, no symptom. Sees [facility] for vision and is UTD. Will do UA on the way out. No concerns.     Monique James is a 76 y.o. female who presents for annual physical exam Medicare wellness visit and follow-up on chronic medical conditions.    Anxiety--related to diagnosis of atrial fibrillation. On citalopram and doing very well. Not needing any alprazolam. Denies side effects to citalopram. Anxiety was triggered in the past by having a nosebleed (causing very high BP, related to anxiety), so previously had said she wanted to stay on the citalopram. She is now willing to consider coming down/off as a trial.  Atrial fibrillation--diagnosed 11/2014.  Last saw cardiology in January, doing well on current regimen.  She denies chest pain, palpitations, bleeding, bruising.  Hypertension--currently well controlled on Cartia and Losartan.  Medications were refilled by Dr. Harl Bowie. She hasn't been checking BP recently at all (but were normal whenever she did, 120's/70's or lower).  Hyperlipidemia: Controlled by diet, which remains the same--she has continued to limit her cheese intake, continuing to eat more fruits and vegetables. She cut out the creamy dressings. She mostly eats chicken and some pork, only has a burger on the grill once a month, if even that often. Has tuna without mayonnaise (uses vinegar). Lab Results  Component Value Date   CHOL 224 (H) 03/09/2016   HDL 89 03/09/2016   LDLCALC 122 03/09/2016   TRIG 63 03/09/2016   CHOLHDL 2.5 03/09/2016    Vitamin D deficiency:  Last level was 30 in November. She has been compliant with taking MVI daily since last visit and Calcium with D once daily.  Allergies:  Taking Allegra daily. She continues on allergy shots every 2 months. She had a wasp sting a couple of months ago--only had a large local reaction.   Immunization  History  Administered Date(s) Administered  . Pneumococcal Conjugate-13 10/07/2015   Allergic to tetanus, flu shots. She refuses pneumovax, and zostavax  Last Pap smear: n/a, s/p hysterectomy  Last mammogram: 10/2016 Last colonoscopy: 04/2012 due again in 2018  Last D[facility]A: 01/2014 at [facility]. T-1.3 Ophtho: yearly Dentist: twice yearly  Exercise: aerobics 3x/week ,some with weights (1 hour class), plus walking Vitamin D 30 in 09/2016  Other doctors caring for patient include: Dentist: Dr. Truman Hayward Ophtho: Dr. Jorja Loa (at [place]) Allergist: Dr. Shaune Leeks GI: Dr. Oletta Lamas Cardiologist: Dr. Harl Bowie Dermatologist: Dr. Nevada Crane  Depression screen: negative Fall screen: [place] once while carrying a box of food to a car, tripped on a curb, landed on knee. No significant injury. Functional Status screen: negative See epic for full questionnaires.  End of Life Discussion:  Patient does not have a living will and medical power of attorney-has forms filled out, just needed to get it notarized, but then lost it.   Past Medical History:  Diagnosis Date  . Anxiety   . Atrial fibrillation with rapid ventricular response ([place]) 11/05/2014   New dx  . 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 Shaune Leeks)  . Colon polyps    Dr. Oletta Lamas  . Colon polyps    path was small leiomyoma  . Constipation   . Gout    history of (1985ish)  . Hemorrhoids   . History of IBS   . Hx of pelvic mass    complex, benign pathology  . Hyperlipidemia  elevated trigs and LDL  . Hypertension   . Hyponatremia    mild  . Macular degeneration of right eye    Dr. Mikey Bussing (mild)--MISDIAGNOSED; 12/2013 told scarring, NOT macular degeneration  . Osteopenia    mild (T-1.3 in 01/2014)  . Shingles 02/2015    Past Surgical History:  Procedure Laterality Date  . ABDOMINAL HYSTERECTOMY  1970   for perforated uterus from IUD  . BILATERAL SALPINGOOPHORECTOMY  2005   benign tumor/cyst (Dr. Aldean Ast)    Social  History   Social History  . Marital status: Widowed    Spouse name: N/A  . Number of children: 3  . Years of education: N/A   Occupational History  . retired Therapist, sports    Social History Main Topics  . Smoking status: Never Smoker  . Smokeless tobacco: Never Used  . Alcohol use 8.4 oz/week    14 Glasses of wine per week     Comment: 1 glass of wine daily, occasionally 2  . Drug use: No  . Sexual activity: Not Currently   Other Topics Concern  . Not on file   Social History Narrative   Lives alone, 2 dogs;  Daughter lives nearby.  3 grandchildren.  Other children in [place].   Widowed (age 3--husband had aortic aneurysm repair, and graft failed/ruptured)--death anniversary date 06-Nov-2023.  He had been a Holocaust survivor.    Family History  Problem Relation Age of Onset  . Hypertension Mother   . Heart disease Mother        MI  . Dementia Mother   . Cancer Father        stomach  . Cancer Brother        multiple myeloma  . Healthy Daughter   . Healthy Daughter   . Healthy Daughter   . Diabetes Neg Hx   . Breast cancer Neg Hx   . Colon cancer Neg Hx     Outpatient Encounter Prescriptions as of 04/05/2017  Medication Sig Note  . apixaban (ELIQUIS) 5 MG TABS tablet Take 1 tablet (5 mg total) by mouth 2 (two) times daily.   . Calcium Carbonate-Vitamin D (CALCIUM 600+D) 600-400 MG-UNIT per tablet Take 1 tablet by mouth daily.    [name] Kitchen CARTIA XT 180 MG 24 hr capsule TAKE ONE CAPSULE BY MOUTH ONCE DAILY   **MEDICATION TO CONTROL YOUR HEART RATE**   . citalopram (CELEXA) 20 MG tablet Take 1 tablet (20 mg total) by mouth daily.   . fexofenadine (ALLEGRA) 180 MG tablet Take 180 mg by mouth daily as needed for allergies or rhinitis. Reported on 02/03/2016 03/09/2016: Takes on the day of her allergy shots and prn allergy symptoms  . FIBER SELECT GUMMIES PO Take 1 each by mouth daily.   [name] Kitchen losartan (COZAAR) 25 MG tablet TAKE ONE TABLET BY MOUTH ONCE DAILY   . Multiple  Vitamins-Minerals (MULTIVITAMIN WITH MINERALS) tablet Take 1 tablet by mouth daily.     . diphenhydrAMINE (BENADRYL) 25 MG tablet Take 25 mg by mouth every 6 (six) hours as needed. 04/05/2017: Uses prn  . EPINEPHrine (EPIPEN 2-PAK) 0.3 mg/0.3 mL IJ SOAJ injection Inject 0.3 mg into the muscle once. Reported on 03/09/2016    No facility-administered encounter medications on file as of 04/05/2017.     Allergies  Allergen Reactions  . Bee Venom Anaphylaxis  . Epinephrine Other (See Comments)    High pulse rate.  . Sulfa Antibiotics Nausea And Vomiting  . Tetanus Toxoids  Swelling    Entire arm swelled  . Vancomycin Nausea And Vomiting  . Adhesive [Tape] Rash    Her skin peels off.  . Influenza Vaccine Live Swelling and Rash  . Ivp Dye [Iodinated Diagnostic Agents] Rash  . Zyloprim [Allopurinol] Rash    ROS: The patient denies anorexia, fever, headaches, vision changes, decreased hearing, ear pain, sore throat, breast concerns, chest pain, palpitations, dizziness, syncope, dyspnea on exertion, cough, swelling, nausea, vomiting, diarrhea,abdominal pain, melena, hematochezia, hematuria, incontinence, dysuria, vaginal bleeding, discharge, odor or itch, genital lesions, numbness, tingling, weakness, tremor, suspicious skin lesions, abnormal bleeding/bruising, or enlarged lymph nodes. 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 Swollen area on the right ring finger, nontender, arthritis in the joint, has some pain, unchanged over the last year. Occasional heartburn with fatty foods (rare, relieved by Tums).  (previously took Prilosec, not in a long time). Some mild constipation, controlled with high fiber diet and stool softeners prn. Dry cough, improved after getting humidifier (started after getting prevnar in 10/2015). Gained some weight over the winter, has started to lose some, per home scale. Current pain score 0 Anxiety is well controlled, denies  depression.  PHYSICAL EXAM:  BP 130/88 (BP Location: Left Arm, Patient Position: Sitting, Cuff Size: Normal)   Pulse 80   Ht 5' 3.25" (1.607 m)   Wt 206 lb (93.4 kg)   BMI 36.20 kg/m   Wt Readings from Last 3 Encounters:  11/27/16 205 lb (93 kg)  09/21/16 202 lb 9.6 oz (91.9 kg)  08/04/16 199 lb (90.3 kg)    General Appearance:  Alert, cooperative, no distress, appears stated age   Head:  Normocephalic, without obvious abnormality, atraumatic   Eyes:  PERRL, Small subconjunctival hemorrhage left medial eye (pt wasn't aware.  Sneezed some earlier); EOM's intact, fundi benign   Ears:  Normal TM's and external ear canals   Nose:  Nares normal, mucosa normal, no drainage or sinus tenderness   Throat:  Lips, mucosa, and tongue normal; teeth and gums normal   Neck:  Supple, no lymphadenopathy; thyroid: no enlargement/tenderness/nodules; no carotid bruit or JVD   Back:  Spine nontender, no curvature, ROM normal, no CVA tenderness   Lungs:  Clear to auscultation bilaterally without wheezes, rales or ronchi; respirations unlabored   Chest Wall:  No tenderness or deformity   Heart:  Regular rate and rhythm, S1 and S2 normal, no murmur, rub or gallop. One skipped beat noted, otherwise regular  Breast Exam:  No tenderness, masses, or nipple discharge or inversion. No axillary lymphadenopathy   Abdomen:  Soft, non-tender, obese, nondistended, normoactive bowel sounds, no masses, no hepatosplenomegaly   Genitalia:  Normal external genitalia without lesions, mild atrophic changes. BUS and vagina normal; No abnormal vaginal discharge. Bimanual exam was normal, without any masses appreciable, nontender   Rectal:  Normal tone, no masses or tenderness; guaiac negative stool   Extremities:  No clubbing, cyanosis or edema. Some bony abnormalities c/w arthritis (especially R ring finger, radial aspect of PIP.  The prior soft tissue swelling on  the proximal phalanx is notedly smaller/softer  Pulses:  2+ and symmetric all extremities   Skin:  Skin color, texture, turgor normal. No rashes or lesions.  Lymph nodes:  Cervical, supraclavicular, and axillary nodes normal   Neurologic:  CNII-XII intact, normal strength, sensation and gait; reflexes 2+ and symmetric throughout   Psych:   Normal mood, affect, hygiene and grooming   ASSESSMENT/PLAN:  Annual  physical exam - Plan: POCT Urinalysis Dipstick, Lipid panel, Comprehensive metabolic panel, CBC with Differential/Platelet, TSH  Atrial fibrillation with rapid ventricular response (HCC) - currently in NSR; on anticoagulant without complication - Plan: TSH  Essential hypertension - borderline today; periodically to check elsewhere; low sodium diet, exercise, weight loss rec - Plan: Comprehensive metabolic panel  Pure hypercholesterolemia - Plan: Lipid panel  Vitamin D deficiency  Generalized anxiety disorder - well controlled; can try cutting dose in half, and if doing well, can further taper  Medication monitoring encounter - Plan: Comprehensive metabolic panel, CBC with Differential/Platelet  Obesity (BMI 30-39.9) - counseled re: healthy diet, portions, exercise discussed, as well as risks of obesity    c-met, cbc, Lipids, TSH  Vit D--continue daily MVI and Ca+D.  recheck at next visit   Discussed monthly self breast exams and yearly mammograms; at least 30 minutes of aerobic activity at least 5 days/week, weight-bearing exercise 2-3x/wk; proper sunscreen use reviewed; healthy diet, including goals of calcium and vitamin D intake and alcohol recommendations (less than or equal to 1 drink/day) reviewed; regular seatbelt use; changing batteries in smoke detectors. Immunization recommendations discussed--allergic to flu shots. Shingrix recommended, as is pneumovax. She is hesitant--would like to discuss with her allergist, possibly get in their  office. Colonoscopy recommendations reviewed, due now.  New forms given for living will and healthcare power of attorney Encouraged her to get pneumovax (will discuss with her allergist) Risks/side effects of shingrix reviewed, recommended.  MOST form updated and reviewed.  Full Code, full care.   Trial 1/2 tablet citalopram daily.  After 3-6 months can taper off completly, if desired, vs going back up to full tablet if any recurrent anxiety.  Check your blood pressure a few times just prior to visits here or with your cardiologist, so that if your blood pressure is up in the office, we will know what it truly has been running at home.  Medicare Attestation I have personally reviewed: The patient's medical and social history Their use of alcohol, tobacco or illicit drugs Their current medications and supplements The patient's functional ability including ADLs,fall risks, home safety risks, cognitive, and hearing and visual impairment Diet and physical activities Evidence for depression or mood disorders  The patient's weight, height and BMI have been recorded in the chart.  I have made referrals, counseling, and provided education to the patient based on review of the above and I have provided the patient with a written personalized care plan for preventive services.

## 2017-04-05 ENCOUNTER — Ambulatory Visit (INDEPENDENT_AMBULATORY_CARE_PROVIDER_SITE_OTHER): Payer: Medicare Other | Admitting: Family Medicine

## 2017-04-05 ENCOUNTER — Encounter: Payer: Self-pay | Admitting: Family Medicine

## 2017-04-05 VITALS — BP 130/88 | HR 80 | Ht 63.25 in | Wt 206.0 lb

## 2017-04-05 DIAGNOSIS — Z5181 Encounter for therapeutic drug level monitoring: Secondary | ICD-10-CM

## 2017-04-05 DIAGNOSIS — F411 Generalized anxiety disorder: Secondary | ICD-10-CM

## 2017-04-05 DIAGNOSIS — E7800 Pure hypercholesterolemia, unspecified: Secondary | ICD-10-CM | POA: Diagnosis not present

## 2017-04-05 DIAGNOSIS — E559 Vitamin D deficiency, unspecified: Secondary | ICD-10-CM | POA: Diagnosis not present

## 2017-04-05 DIAGNOSIS — I10 Essential (primary) hypertension: Secondary | ICD-10-CM

## 2017-04-05 DIAGNOSIS — E669 Obesity, unspecified: Secondary | ICD-10-CM

## 2017-04-05 DIAGNOSIS — Z0000 Encounter for general adult medical examination without abnormal findings: Secondary | ICD-10-CM | POA: Diagnosis not present

## 2017-04-05 DIAGNOSIS — I4891 Unspecified atrial fibrillation: Secondary | ICD-10-CM

## 2017-04-05 LAB — CBC WITH DIFFERENTIAL/PLATELET
Basophils Absolute: 0 cells/uL (ref 0–200)
Basophils Relative: 0 %
EOS ABS: 85 {cells}/uL (ref 15–500)
Eosinophils Relative: 1 %
HEMATOCRIT: 42.6 % (ref 35.0–45.0)
Hemoglobin: 14.4 g/dL (ref 11.7–15.5)
LYMPHS PCT: 28 %
Lymphs Abs: 2380 cells/uL (ref 850–3900)
MCH: 32.4 pg (ref 27.0–33.0)
MCHC: 33.8 g/dL (ref 32.0–36.0)
MCV: 95.7 fL (ref 80.0–100.0)
MONO ABS: 680 {cells}/uL (ref 200–950)
MONOS PCT: 8 %
MPV: 9.8 fL (ref 7.5–12.5)
NEUTROS PCT: 63 %
Neutro Abs: 5355 cells/uL (ref 1500–7800)
PLATELETS: 318 10*3/uL (ref 140–400)
RBC: 4.45 MIL/uL (ref 3.80–5.10)
RDW: 13.6 % (ref 11.0–15.0)
WBC: 8.5 10*3/uL (ref 4.0–10.5)

## 2017-04-05 LAB — POCT URINALYSIS DIPSTICK
BILIRUBIN UA: NEGATIVE
Glucose, UA: NEGATIVE
LEUKOCYTES UA: NEGATIVE
Nitrite, UA: NEGATIVE
PH UA: 6 (ref 5.0–8.0)
PROTEIN UA: NEGATIVE
SPEC GRAV UA: 1.02 (ref 1.010–1.025)
Urobilinogen, UA: NEGATIVE E.U./dL — AB

## 2017-04-05 LAB — TSH: TSH: 2.17 mIU/L

## 2017-04-05 NOTE — Patient Instructions (Addendum)
HEALTH MAINTENANCE RECOMMENDATIONS:  It is recommended that you get at least 30 minutes of aerobic exercise at least 5 days/week (for weight loss, you may need as much as 60-90 minutes). This can be any activity that gets your heart rate up. This can be divided in 10-15 minute intervals if needed, but try and build up your endurance at least once a week.  Weight bearing exercise is also recommended twice weekly.  Eat a healthy diet with lots of vegetables, fruits and fiber.  "Colorful" foods have a lot of vitamins (ie green vegetables, tomatoes, red peppers, etc).  Limit sweet tea, regular sodas and alcoholic beverages, all of which has a lot of calories and sugar.  Up to 1 alcoholic drink daily may be beneficial for women (unless trying to lose weight, watch sugars).  Drink a lot of water.  Calcium recommendations are 1200-1500 mg daily (1500 mg for postmenopausal women or women without ovaries), and vitamin D 1000 IU daily.  This should be obtained from diet and/or supplements (vitamins), and calcium should not be taken all at once, but in divided doses.  Monthly self breast exams and yearly mammograms for women over the age of 46 is recommended.  Sunscreen of at least SPF 30 should be used on all sun-exposed parts of the skin when outside between the hours of 10 am and 4 pm (not just when at beach or pool, but even with exercise, golf, tennis, and yard work!)  Use a sunscreen that says "broad spectrum" so it covers both UVA and UVB rays, and make sure to reapply every 1-2 hours.  Remember to change the batteries in your smoke detectors when changing your clock times in the spring and fall.  Use your seat belt every time you are in a car, and please drive safely and not be distracted with cell phones and texting while driving.   Monique James , Thank you for taking time to come for your Medicare Wellness Visit. I appreciate your ongoing commitment to your health goals. Please review the  following plan we discussed and let me know if I can assist you in the future.   These are the goals we discussed: Goals    None      This is a list of the screening recommended for you and due dates:  Health Maintenance  Topic Date Due  . Pneumonia vaccines (2 of 2 - PPSV23) 10/06/2016  . DEXA scan (bone density measurement)  Completed   Pneumovax is recommended.  I know you are hesitant given your history of vaccine allergies.  Perhaps consider discussing this with your allergist, maybe see if you could get it there, if that would make you feel better about it.  You only need this one vaccine, just once.  I recommend getting the new shingles vaccine (Shingrix). You will need to check with your insurance to see if it is covered, and if covered by Medicare Part D, you need to get from the pharmacy rather than our office.  It is a series of 2 injections, spaced 2 months apart.  Mammogram will be due 10/2017. Colonoscopy is due this month.  Please call GI to schedule.  Check your blood pressure a few times just prior to visits here or with your cardiologist, so that if your blood pressure is up in the office, we will know what it truly has been running at home.  We discussed a trial of cutting the citalopram dose in 1/2.  I would  stay at the 1/2 tablet for at least 3 months.  If no increased anxiety, you can consider taking it every other day for 2 weeks then stopping.  There is no need to completely come off prior to your next visit with me if you have any concerns.  It is fine to stay at the 1/2 tablet for longer (until we discuss at next visit).  If you have any recurrence of anxiety symptoms at the half dose, increase back to the full tablet.

## 2017-04-06 ENCOUNTER — Other Ambulatory Visit: Payer: Self-pay | Admitting: Adult Health

## 2017-04-06 ENCOUNTER — Encounter: Payer: Self-pay | Admitting: Family Medicine

## 2017-04-06 LAB — LIPID PANEL
Cholesterol: 217 mg/dL — ABNORMAL HIGH (ref <200)
HDL: 100 mg/dL (ref >50)
LDL CALC: 103 mg/dL — AB (ref <100)
Total CHOL/HDL Ratio: 2.2 Ratio (ref <5.0)
Triglycerides: 71 mg/dL (ref <150)
VLDL: 14 mg/dL (ref <30)

## 2017-04-06 LAB — COMPREHENSIVE METABOLIC PANEL
ALT: 11 U/L (ref 6–29)
AST: 17 U/L (ref 10–35)
Albumin: 4.1 g/dL (ref 3.6–5.1)
Alkaline Phosphatase: 78 U/L (ref 33–130)
BILIRUBIN TOTAL: 0.6 mg/dL (ref 0.2–1.2)
BUN: 14 mg/dL (ref 7–25)
CHLORIDE: 102 mmol/L (ref 98–110)
CO2: 20 mmol/L (ref 20–31)
CREATININE: 0.83 mg/dL (ref 0.60–0.93)
Calcium: 9.7 mg/dL (ref 8.6–10.4)
Glucose, Bld: 95 mg/dL (ref 65–99)
Potassium: 4.5 mmol/L (ref 3.5–5.3)
SODIUM: 135 mmol/L (ref 135–146)
TOTAL PROTEIN: 7.2 g/dL (ref 6.1–8.1)

## 2017-04-22 ENCOUNTER — Ambulatory Visit (INDEPENDENT_AMBULATORY_CARE_PROVIDER_SITE_OTHER): Payer: Medicare Other

## 2017-04-22 ENCOUNTER — Ambulatory Visit: Payer: Self-pay

## 2017-04-22 DIAGNOSIS — T63441D Toxic effect of venom of bees, accidental (unintentional), subsequent encounter: Secondary | ICD-10-CM

## 2017-05-19 DIAGNOSIS — Z1283 Encounter for screening for malignant neoplasm of skin: Secondary | ICD-10-CM | POA: Diagnosis not present

## 2017-05-19 DIAGNOSIS — D225 Melanocytic nevi of trunk: Secondary | ICD-10-CM | POA: Diagnosis not present

## 2017-05-26 ENCOUNTER — Encounter: Payer: Self-pay | Admitting: Cardiology

## 2017-05-26 ENCOUNTER — Ambulatory Visit (INDEPENDENT_AMBULATORY_CARE_PROVIDER_SITE_OTHER): Payer: Medicare Other | Admitting: Cardiology

## 2017-05-26 VITALS — BP 142/80 | HR 80 | Ht 64.5 in | Wt 205.2 lb

## 2017-05-26 DIAGNOSIS — I480 Paroxysmal atrial fibrillation: Secondary | ICD-10-CM | POA: Diagnosis not present

## 2017-05-26 DIAGNOSIS — I10 Essential (primary) hypertension: Secondary | ICD-10-CM

## 2017-05-26 NOTE — Progress Notes (Signed)
Clinical Summary Monique James is a 76 y.o.female seen today for follow up of the following medical problems.   1. Afib - no recent palpitations - compliant with meds. No bleeding on eliquis.   2. HTN - compliant with bp meds - home bp/s 120s/60s    SH has 2 dogs. A 59 year old boxer , mixed breed dog 17 yo. Goes to water aerobics 3 times a wek  Past Medical History:  Diagnosis Date  . Anxiety   . Atrial fibrillation with rapid ventricular response ([place]) 11/05/2014   New dx  . 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 Shaune Leeks)  . Colon polyps    Dr. Oletta Lamas  . Colon polyps    path was small leiomyoma  . Constipation   . Gout    history of (1985ish)  . Hemorrhoids   . History of IBS   . Hx of pelvic mass    complex, benign pathology  . Hyperlipidemia    elevated trigs and LDL  . Hypertension   . Hyponatremia    mild  . Macular degeneration of right eye    Dr. Mikey Bussing (mild)--MISDIAGNOSED; 12/2013 told scarring, NOT macular degeneration  . Osteopenia    mild (T-1.3 in 01/2014)  . Shingles 02/2015     Allergies  Allergen Reactions  . Bee Venom Anaphylaxis  . Epinephrine Other (See Comments)    High pulse rate.  . Sulfa Antibiotics Nausea And Vomiting  . Tetanus Toxoids Swelling    Entire arm swelled  . Vancomycin Nausea And Vomiting  . Adhesive [Tape] Rash    Her skin peels off.  . Influenza Vaccine Live Swelling and Rash  . Ivp Dye [Iodinated Diagnostic Agents] Rash  . Zyloprim [Allopurinol] Rash     Current Outpatient Prescriptions  Medication Sig Dispense Refill  . Calcium Carbonate-Vitamin D (CALCIUM 600+D) 600-400 MG-UNIT per tablet Take 1 tablet by mouth daily.     [name] Kitchen CARTIA XT 180 MG 24 hr capsule TAKE ONE CAPSULE BY MOUTH ONCE DAILY   **MEDICATION TO CONTROL YOUR HEART RATE** 90 capsule 1  . citalopram (CELEXA) 20 MG tablet Take 1 tablet (20 mg total) by mouth daily. 90 tablet 3  . diphenhydrAMINE (BENADRYL) 25 MG tablet Take 25 mg by mouth  every 6 (six) hours as needed.    [name] Kitchen ELIQUIS 5 MG TABS tablet TAKE ONE TABLET BY MOUTH TWICE DAILY 60 tablet 6  . EPINEPHrine (EPIPEN 2-PAK) 0.3 mg/0.3 mL IJ SOAJ injection Inject 0.3 mg into the muscle once. Reported on 03/09/2016    . fexofenadine (ALLEGRA) 180 MG tablet Take 180 mg by mouth daily as needed for allergies or rhinitis. Reported on 02/03/2016    . FIBER SELECT GUMMIES PO Take 1 each by mouth daily.    [name] Kitchen losartan (COZAAR) 25 MG tablet TAKE ONE TABLET BY MOUTH ONCE DAILY 90 tablet 1  . Multiple Vitamins-Minerals (MULTIVITAMIN WITH MINERALS) tablet Take 1 tablet by mouth daily.       No current facility-administered medications for this visit.      Past Surgical History:  Procedure Laterality Date  . ABDOMINAL HYSTERECTOMY  1970   for perforated uterus from IUD  . BILATERAL SALPINGOOPHORECTOMY  2005   benign tumor/cyst (Dr. Aldean Ast)     Allergies  Allergen Reactions  . Bee Venom Anaphylaxis  . Epinephrine Other (See Comments)    High pulse rate.  . Sulfa Antibiotics Nausea And Vomiting  . Tetanus Toxoids Swelling  Entire arm swelled  . Vancomycin Nausea And Vomiting  . Adhesive [Tape] Rash    Her skin peels off.  . Influenza Vaccine Live Swelling and Rash  . Ivp Dye [Iodinated Diagnostic Agents] Rash  . Zyloprim [Allopurinol] Rash      Family History  Problem Relation Age of Onset  . Hypertension Mother   . Heart disease Mother        MI  . Dementia Mother   . Cancer Father        stomach  . Cancer Brother        multiple myeloma  . Healthy Daughter   . Healthy Daughter   . Healthy Daughter   . Diabetes Neg Hx   . Breast cancer Neg Hx   . Colon cancer Neg Hx      Social History Monique James reports that she has never smoked. She has never used smokeless tobacco. Monique James reports that she drinks about 8.4 oz of alcohol per week .   Review of Systems CONSTITUTIONAL: No weight loss, fever, chills, weakness or fatigue.  HEENT: Eyes: No  visual loss, blurred vision, double vision or yellow sclerae.No hearing loss, sneezing, congestion, runny nose or sore throat.  SKIN: No rash or itching.  CARDIOVASCULAR: per hpi RESPIRATORY: No shortness of breath, cough or sputum.  GASTROINTESTINAL: No anorexia, nausea, vomiting or diarrhea. No abdominal pain or blood.  GENITOURINARY: No burning on urination, no polyuria NEUROLOGICAL: No headache, dizziness, syncope, paralysis, ataxia, numbness or tingling in the extremities. No change in bowel or bladder control.  MUSCULOSKELETAL: No muscle, back pain, joint pain or stiffness.  LYMPHATICS: No enlarged nodes. No history of splenectomy.  PSYCHIATRIC: No history of depression or anxiety.  ENDOCRINOLOGIC: No reports of sweating, cold or heat intolerance. No polyuria or polydipsia.  [name] Kitchen   Physical Examination Vitals:   05/26/17 0810  BP: (!) 142/80  Pulse: 80   Filed Weights   05/26/17 0810  Weight: 205 lb 3.2 oz (93.1 kg)    Gen: resting comfortably, no acute distress HEENT: no scleral icterus, pupils equal round and reactive, no palptable cervical adenopathy,  CV: RRR, no m/r/g, no jvd Resp: Clear to auscultation bilaterally GI: abdomen is soft, non-tender, non-distended, normal bowel sounds, no hepatosplenomegaly MSK: extremities are warm, no edema.  Skin: warm, no rash Neuro:  no focal deficits Psych: appropriate affect   Diagnostic Studies Jan 2016 echo Study Conclusions  - Left ventricle: The cavity size was normal. Wall thickness was increased in a pattern of moderate LVH. Systolic function was vigorous. The estimated ejection fraction was in the range of 65% to 70%. Diastolic function is abnormal, indeterminant grade. Wall motion was normal; there were no regional wall motion abnormalities. - Aortic valve: Mildly calcified annulus. Trileaflet; mildly thickened leaflets. - Mitral valve: Mildly calcified annulus. Mildly thickened  leaflets .    Assessment and Plan  1. Afib - CHADS2Vasc scor eis 4, continue eliquis - continue current meds  2. HTN - her bp is at goal, continue current meds      Arnoldo Lenis, M.D.

## 2017-05-26 NOTE — Patient Instructions (Signed)
Medication Instructions:  Your physician recommends that you continue on your current medications as directed. Please refer to the Current Medication list given to you today.  Labwork: NONE  Testing/Procedures: NONE  Follow-Up: Your physician wants you to follow-up in: 1 YEAR WITH DR. BRANCH. You will receive a reminder letter in the mail two months in advance. If you don't receive a letter, please call our office to schedule the follow-up appointment.  Any Other Special Instructions Will Be Listed Below (If Applicable).  If you need a refill on your cardiac medications before your next appointment, please call your pharmacy. 

## 2017-06-10 ENCOUNTER — Telehealth: Payer: Self-pay | Admitting: Cardiology

## 2017-06-10 NOTE — Telephone Encounter (Signed)
Called pt, will give 3 boxes to help her with being in doughnut hole-  Lot # [id number] Exp dec. 2020

## 2017-06-10 NOTE — Telephone Encounter (Signed)
Pt is doughnut hole w/ her Eliquis, would like to have some samples.

## 2017-06-17 ENCOUNTER — Ambulatory Visit (INDEPENDENT_AMBULATORY_CARE_PROVIDER_SITE_OTHER): Payer: Medicare Other | Admitting: *Deleted

## 2017-06-17 DIAGNOSIS — T63441D Toxic effect of venom of bees, accidental (unintentional), subsequent encounter: Secondary | ICD-10-CM | POA: Diagnosis not present

## 2017-07-12 ENCOUNTER — Encounter: Payer: Self-pay | Admitting: Allergy and Immunology

## 2017-07-12 ENCOUNTER — Ambulatory Visit (INDEPENDENT_AMBULATORY_CARE_PROVIDER_SITE_OTHER): Payer: Medicare Other | Admitting: Allergy and Immunology

## 2017-07-12 DIAGNOSIS — Z91038 Other insect allergy status: Secondary | ICD-10-CM

## 2017-07-12 NOTE — Assessment & Plan Note (Addendum)
   Continue careful avoidance of flying insects and have access to epinephrine autoinjector 2 pack in case of systemic symptoms associated with a sting.    Emergency allergy action plan is in place.  She will let us know when she needs a refill for epinephrine autoinjectors.

## 2017-07-12 NOTE — Patient Instructions (Addendum)
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    Emergency allergy action plan is in place.  She will let us know when she needs a refill for epinephrine autoinjectors.   Return in about 1 year (around 07/12/2018), or if symptoms worsen or fail to improve.

## 2017-07-12 NOTE — Progress Notes (Signed)
Follow-up Note  RE: Monique James MRN: [id number] DOB: 10-May-1941 Date of Office Visit: 07/12/2017  Primary care provider: Rita Ohara, MD Referring provider: Rita Ohara, MD  History of present illness: Monique James is a 76 y.o. 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  She reports that in May she was stung by what she believes to a been a wasp but only developed a large local reaction.  She is currently receiving venom immunotherapy injections every 8 weeks.  She reports that she does not require a refill for epinephrine autoinjectors currently, but will in early 2019.   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    Emergency allergy action plan is in place.  She will let us know when she needs a refill for epinephrine autoinjectors.   Physical examination: Blood pressure (!) 148/70, pulse 76, temperature 98.1 F (36.7 C), temperature source Oral, resp. rate 18, height 5' 2.5" (1.588 m), weight 207 lb (93.9 kg), SpO2 95 %.  General: Alert, interactive, in no acute distress. Neck: Supple without lymphadenopathy. Lungs: Clear to auscultation without wheezing, rhonchi or rales. CV: Normal S1, S2 without murmurs. Skin: Warm and dry, without lesions or rashes.  The following portions of the patient's history were reviewed and updated as appropriate: allergies, current medications, past family history, past medical history, past social history, past surgical history and problem list.  Allergies as of 07/12/2017      Reactions   Bee Venom Anaphylaxis   Epinephrine Other (See Comments)   High pulse rate.   Sulfa Antibiotics Nausea And Vomiting   Tetanus Toxoids Swelling   Entire arm swelled   Vancomycin Nausea And Vomiting   Adhesive [tape] Rash   Her skin peels off.   Influenza  Vaccine Live Swelling, Rash   Ivp Dye [iodinated Diagnostic Agents] Rash   Zyloprim [allopurinol] Rash      Medication List       Accurate as of 07/12/17  9:19 PM. Always use your most recent med list.          CALCIUM 600+D 600-400 MG-UNIT tablet Generic drug:  Calcium Carbonate-Vitamin D Take 1 tablet by mouth daily.   CARTIA XT 180 MG 24 hr capsule Generic drug:  diltiazem TAKE ONE CAPSULE BY MOUTH ONCE DAILY   **MEDICATION TO CONTROL YOUR HEART RATE**   citalopram 20 MG tablet Commonly known as:  CELEXA Take 1 tablet (20 mg total) by mouth daily.   diphenhydrAMINE 25 MG tablet Commonly known as:  BENADRYL Take 25 mg by mouth every 6 (six) hours as needed.   ELIQUIS 5 MG Tabs tablet Generic drug:  apixaban TAKE ONE TABLET BY MOUTH TWICE DAILY   EPIPEN 2-PAK 0.3 mg/0.3 mL Soaj injection Generic drug:  EPINEPHrine Inject 0.3 mg into the muscle once. Reported on 03/09/2016   fexofenadine 180 MG tablet Commonly known as:  ALLEGRA Take 180 mg by mouth daily as needed for allergies or rhinitis. Reported on 02/03/2016   losartan 25 MG tablet Commonly known as:  COZAAR TAKE ONE TABLET BY MOUTH ONCE DAILY   multivitamin with minerals tablet Take 1 tablet by mouth daily.       Allergies  Allergen Reactions  . Bee Venom Anaphylaxis  . Epinephrine Other (See Comments)    High pulse rate.  . Sulfa Antibiotics Nausea And  Vomiting  . Tetanus Toxoids Swelling    Entire arm swelled  . Vancomycin Nausea And Vomiting  . Adhesive [Tape] Rash    Her skin peels off.  . Influenza Vaccine Live Swelling and Rash  . Ivp Dye [Iodinated Diagnostic Agents] Rash  . Zyloprim [Allopurinol] Rash    I appreciate the opportunity to take part in Tanyika's care. Please do not hesitate to contact me with questions.  Sincerely,   R. Edgar Frisk, MD

## 2017-07-19 ENCOUNTER — Ambulatory Visit (INDEPENDENT_AMBULATORY_CARE_PROVIDER_SITE_OTHER): Payer: Medicare Other | Admitting: Family Medicine

## 2017-07-19 ENCOUNTER — Encounter: Payer: Self-pay | Admitting: Family Medicine

## 2017-07-19 VITALS — BP 130/80 | HR 84 | Ht 62.5 in | Wt 206.6 lb

## 2017-07-19 DIAGNOSIS — M545 Low back pain, unspecified: Secondary | ICD-10-CM

## 2017-07-19 MED ORDER — METAXALONE 800 MG PO TABS: 400.0000 mg | ORAL_TABLET | Freq: Three times a day (TID) | ORAL | 0 refills | Status: DC | PRN

## 2017-07-19 NOTE — Patient Instructions (Addendum)
You were tender at the right SI joint. If you having ongoing pain, we may need to consider a course of prednisone. Let us know if you aren't improving.  Take the skelaxin as needed for muscle spasms.  Start at 1/2 tablet, and increase to the full dose if ineffective and no side effects.  This isn't as sedating as Flexeril, but use with caution.  Continue the tylenol as needed.  Try ice vs heat, vs alternating. Consider trying Biofreeze or [facility].  Avoid bending/twisting.  Be sure to use proper technique for lifting (bend at the knees!)  [facility]--listen to your body.  Consider yoga classes for core strengthening when your back is feeling better.   Back Exercises The following exercises strengthen the muscles that help to support the back. They also help to keep the lower back flexible. Doing these exercises can help to prevent back pain or lessen existing pain. If you have back pain or discomfort, try doing these exercises 2-3 times each day or as told by your health care provider. When the pain goes away, do them once each day, but increase the number of times that you repeat the steps for each exercise (do more repetitions). If you do not have back pain or discomfort, do these exercises once each day or as told by your health care provider. Exercises Single Knee to Chest  Repeat these steps 3-5 times for each leg: 1. Lie on your back on a firm bed or the floor with your legs extended. 2. Bring one knee to your chest. Your other leg should stay extended and in contact with the floor. 3. Hold your knee in place by grabbing your knee or thigh. 4. Pull on your knee until you feel a gentle stretch in your lower back. 5. Hold the stretch for 10-30 seconds. 6. Slowly release and straighten your leg.  Pelvic Tilt  Repeat these steps 5-10 times: 1. Lie on your back on a firm bed or the floor with your legs extended. 2. Bend your knees so they  are pointing toward the ceiling and your feet are flat on the floor. 3. Tighten your lower abdominal muscles to press your lower back against the floor. This motion will tilt your pelvis so your tailbone points up toward the ceiling instead of pointing to your feet or the floor. 4. With gentle tension and even breathing, hold this position for 5-10 seconds.  Cat-Cow  Repeat these steps until your lower back becomes more flexible: 1. Get into a hands-and-knees position on a firm surface. Keep your hands under your shoulders, and keep your knees under your hips. You may place padding under your knees for comfort. 2. Let your head hang down, and point your tailbone toward the floor so your lower back becomes rounded like the back of a cat. 3. Hold this position for 5 seconds. 4. Slowly lift your head and point your tailbone up toward the ceiling so your back forms a sagging arch like the back of a cow. 5. Hold this position for 5 seconds.  Press-Ups  Repeat these steps 5-10 times: 1. Lie on your abdomen (face-down) on the floor. 2. Place your palms near your head, about shoulder-width apart. 3. While you keep your back as relaxed as possible and keep your hips on the floor, slowly straighten your arms to raise the top half of your body and lift your shoulders. Do not use your  back muscles to raise your upper torso. You may adjust the placement of your hands to make yourself more comfortable. 4. Hold this position for 5 seconds while you keep your back relaxed. 5. Slowly return to lying flat on the floor.  Bridges  Repeat these steps 10 times: 1. Lie on your back on a firm surface. 2. Bend your knees so they are pointing toward the ceiling and your feet are flat on the floor. 3. Tighten your buttocks muscles and lift your buttocks off of the floor until your waist is at almost the same height as your knees. You should feel the muscles working in your buttocks and the back of your thighs. If  you do not feel these muscles, slide your feet 1-2 inches farther away from your buttocks. 4. Hold this position for 3-5 seconds. 5. Slowly lower your hips to the starting position, and allow your buttocks muscles to relax completely.  If this exercise is too easy, try doing it with your arms crossed over your chest. Abdominal Crunches  Repeat these steps 5-10 times: 1. Lie on your back on a firm bed or the floor with your legs extended. 2. Bend your knees so they are pointing toward the ceiling and your feet are flat on the floor. 3. Cross your arms over your chest. 4. Tip your chin slightly toward your chest without bending your neck. 5. Tighten your abdominal muscles and slowly raise your trunk (torso) high enough to lift your shoulder blades a tiny bit off of the floor. Avoid raising your torso higher than that, because it can put too much stress on your low back and it does not help to strengthen your abdominal muscles. 6. Slowly return to your starting position.  Back Lifts Repeat these steps 5-10 times: 1. Lie on your abdomen (face-down) with your arms at your sides, and rest your forehead on the floor. 2. Tighten the muscles in your legs and your buttocks. 3. Slowly lift your chest off of the floor while you keep your hips pressed to the floor. Keep the back of your head in line with the curve in your back. Your eyes should be looking at the floor. 4. Hold this position for 3-5 seconds. 5. Slowly return to your starting position.  Contact a health care provider if:  Your back pain or discomfort gets much worse when you do an exercise.  Your back pain or discomfort does not lessen within 2 hours after you exercise. If you have any of these problems, stop doing these exercises right away. Do not do them again unless your health care provider says that you can. Get help right away if:  You develop sudden, severe back pain. If this happens, stop doing the exercises right away. Do  not do them again unless your health care provider says that you can. This information is not intended to replace advice given to you by your health care provider. Make sure you discuss any questions you have with your health care provider. Document Released: 11/26/2004 Document Revised: 02/26/2016 Document Reviewed: 12/13/2014 Elsevier Interactive Patient Education  2017 [employer].

## 2017-07-19 NOTE — Progress Notes (Signed)
Chief Complaint  Patient presents with  . Back Pain    low pain pain. Tried to get a urine specimen(patient did not want to give specimen-said she didn't think it was kidney related). 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   . Flu Vaccine    declined and deferred.    9/11 she was pulling meat out of a deep freezer (volunteering at a food pantry) and she pulled her back.  She didn't have acute onset of pain, is usually a little sore after all the lifting from volunteering.  The next morning the pain was worse.  Pain is in the middle of her lower back.  Denies any radiation of the pain. Last night she was having spasms across the lower back on both sides.  Last night's spasms were worse than other nights.  She has tried tylenol and heat. Tylenol takes the edge off, heat didn't help at all.  Laying on her side helped a little. Pain seems to come and go.  Denies numbness, tingling, weakness, bowel or bladder changes. No urinary complaints.  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 She reports flexeril has worked in the past. It has been many years since she took this  Current pain level is zero, sitting in office  [facility], [place], [place] reviewed  Outpatient Encounter Prescriptions as of 07/19/2017  Medication Sig Note  . Calcium Carbonate-Vitamin D (CALCIUM 600+D) 600-400 MG-UNIT per tablet Take 1 tablet by mouth daily.    [name] Kitchen CARTIA XT 180 MG 24 hr capsule TAKE ONE CAPSULE BY MOUTH ONCE DAILY   **MEDICATION TO CONTROL YOUR HEART RATE**   . citalopram (CELEXA) 20 MG tablet Take 1 tablet (20 mg total) by mouth daily.   [name] Kitchen ELIQUIS 5 MG TABS tablet TAKE ONE TABLET BY MOUTH TWICE DAILY   . fexofenadine (ALLEGRA) 180 MG tablet Take 180 mg by mouth daily as needed for allergies or rhinitis. Reported on 02/03/2016 03/09/2016: Takes on the day of her allergy shots and prn allergy symptoms  . losartan (COZAAR) 25 MG tablet TAKE ONE TABLET BY MOUTH ONCE  DAILY   . Multiple Vitamins-Minerals (MULTIVITAMIN WITH MINERALS) tablet Take 1 tablet by mouth daily.     . diphenhydrAMINE (BENADRYL) 25 MG tablet Take 25 mg by mouth every 6 (six) hours as needed. 04/05/2017: Uses prn  . EPINEPHrine (EPIPEN 2-PAK) 0.3 mg/0.3 mL IJ SOAJ injection Inject 0.3 mg into the muscle once. Reported on 03/09/2016   . metaxalone (SKELAXIN) 800 MG tablet Take 0.5-1 tablets (400-800 mg total) by mouth 3 (three) times daily as needed for muscle spasms.    No facility-administered encounter medications on file as of 07/19/2017.    (skelaxin rx'd today, not taking prior to visit).  Allergies  Allergen Reactions  . Bee Venom Anaphylaxis  . Epinephrine Other (See Comments)    High pulse rate.  . Sulfa Antibiotics Nausea And Vomiting  . Tetanus Toxoids Swelling    Entire arm swelled  . Vancomycin Nausea And Vomiting  . Adhesive [Tape] Rash    Her skin peels off.  . Influenza Vaccine Live Swelling and Rash  . Ivp Dye [Iodinated Diagnostic Agents] Rash  . Zyloprim [Allopurinol] Rash    ROS:  No fever, chills, URI symptoms, chest pain, shortness of breath, headache, dizziness, syncope, nausea, vomiting, bowel changes, bleeding, bruising, rash, bowel/bladder changes, numbness, tingling or weakness. Back pain per HPI.  PHYSICAL EXAM:  BP  130/80 (BP Location: Left Arm, Patient Position: Sitting, Cuff Size: Normal)   Pulse 84   Ht 5' 2.5" (1.588 m)   Wt 206 lb 9.6 oz (93.7 kg)   BMI 37.19 kg/m   Well developed, pleasant female, in no distress, in good spirits Neck: no lymphadenopathy or mass, no spinal tenderness Heart: regular rate and rhythm Lungs: clear bilaterally Back: no spinal tenderness or CVA tenderness.  No muscle spasm.  +Tenderness at R SI joint.  No pyriformis spasm. Neuro: alert and oriented, cranial nerves intact, normal DTR's, strength, gait Skin: normal turgor, no rash Psych: normal mood, affect, hygiene and grooming   ASSESSMENT/PLAN:  Acute  bilateral low back pain without sciatica - SI strain, with intermittent muscle spasm. Tylenol, ice vs heat, stretches/core strengthening. Skelaxin prn spasm (risks/SE reviewed). f/u if worse   Discussed risks/side effects of muscle relaxants. Avoid flexeril (can go back to a 5mg  dose if current choice is ineffective) Try skelaxin 400-800mg  TID prn. Avoid NSAIDs due to anticoagulation Continue tylenol as needed   You were tender at the right SI joint. If you having ongoing pain, we may need to consider a course of prednisone. Let us know if you aren't improving.  Take the skelaxin as needed for muscle spasms.  Start at 1/2 tablet, and increase to the full dose if ineffective and no side effects.  This isn't as sedating as Flexeril, but use with caution.  Continue the tylenol as needed.  Try ice vs heat, vs alternating.  Avoid bending/twisting.  Be sure to use proper technique for lifting (bend at the knees!)  [facility]--listen to your body.  Consider yoga classes for core strengthening when your back is feeling better.

## 2017-07-26 ENCOUNTER — Telehealth: Payer: Self-pay | Admitting: Family Medicine

## 2017-07-26 MED ORDER — TRAMADOL HCL 50 MG PO TABS: 50.0000 mg | ORAL_TABLET | Freq: Three times a day (TID) | ORAL | 0 refills | Status: DC | PRN

## 2017-07-26 MED ORDER — PREDNISONE 20 MG PO TABS: 20.0000 mg | ORAL_TABLET | Freq: Two times a day (BID) | ORAL | 0 refills | Status: DC

## 2017-07-26 NOTE — Telephone Encounter (Signed)
Pt called and she would like the Prenisone, she will also accept the pain medication.  She is leary of it, but it take it only at night.  Please send to the [facility], [place], [place]

## 2017-07-26 NOTE — Telephone Encounter (Signed)
We had discussed a course of prednisone (to act as anti-inflammatory) if not improving, since she can't take NSAIDs due to being on blood thinners.  I recommend prednisone 20mg  BID x 5 days #10. See if she also needs a pain medication, and if so, okay for #10 of 50mg  tramadol, to take 1 TID prn severe pain (may cause drowsiness, likely should just take at bedtime).  If she is good with this plan, please send in rx's

## 2017-07-26 NOTE — Telephone Encounter (Signed)
Pt called and states that the skelaxin is only working a little that the pain on the right is still real bad, states that the pain is a number is 8 esp when she is laying down or coughing if she is just sitting still it is just like 3 or 4, she states that if the pain pills was not working you would call her something in, states the pain at night is the worse, she can be reached at [place], [place] [address] [place]

## 2017-07-26 NOTE — Telephone Encounter (Signed)
Done for both rx's.

## 2017-08-12 ENCOUNTER — Ambulatory Visit (INDEPENDENT_AMBULATORY_CARE_PROVIDER_SITE_OTHER): Payer: Medicare Other

## 2017-08-12 DIAGNOSIS — T63441D Toxic effect of venom of bees, accidental (unintentional), subsequent encounter: Secondary | ICD-10-CM | POA: Diagnosis not present

## 2017-08-16 ENCOUNTER — Other Ambulatory Visit: Payer: Self-pay | Admitting: Cardiology

## 2017-08-24 ENCOUNTER — Other Ambulatory Visit: Payer: Self-pay | Admitting: Family Medicine

## 2017-08-24 DIAGNOSIS — Z1231 Encounter for screening mammogram for malignant neoplasm of breast: Secondary | ICD-10-CM

## 2017-09-06 ENCOUNTER — Other Ambulatory Visit: Payer: Self-pay | Admitting: *Deleted

## 2017-09-06 ENCOUNTER — Telehealth: Payer: Self-pay | Admitting: Family Medicine

## 2017-09-06 DIAGNOSIS — Z1211 Encounter for screening for malignant neoplasm of colon: Secondary | ICD-10-CM

## 2017-09-06 NOTE — Telephone Encounter (Signed)
Done and patient advised.  

## 2017-09-06 NOTE — Telephone Encounter (Signed)
Referral done

## 2017-09-06 NOTE — Telephone Encounter (Signed)
Pt requesting referral to GI doctor, Dr. Oneida Alar @ [facility]  [phone]#[phone]  GYB#[phone]. Pt typically sees Dr Oletta Lamas for GI issues & hx of polyps, pt says time for 5 year f/u. She would like to switch to [facility] doctor that is close to her now but need referral.

## 2017-09-06 NOTE — Telephone Encounter (Signed)
Ok for referral?

## 2017-09-13 ENCOUNTER — Telehealth: Payer: Self-pay | Admitting: Gastroenterology

## 2017-09-13 NOTE — Telephone Encounter (Signed)
[phone] please call patient, received letter and is having no current issues

## 2017-09-20 NOTE — Telephone Encounter (Signed)
Pt was pt with [facility] and wants to come here and see Dr. Oneida Alar for colonoscopy. Dr. Oneida Alar, please advise!

## 2017-09-20 NOTE — Telephone Encounter (Signed)
Ok to triage for TCS.

## 2017-09-21 ENCOUNTER — Telehealth: Payer: Self-pay

## 2017-09-21 NOTE — Telephone Encounter (Signed)
Opened in error

## 2017-09-21 NOTE — Telephone Encounter (Signed)
Pt is on Eliquis and has appt with Walden Field, NP on 11/17/2017 at 9:.30 Am.

## 2017-10-06 ENCOUNTER — Ambulatory Visit (HOSPITAL_COMMUNITY): Payer: Medicare Other

## 2017-10-07 ENCOUNTER — Ambulatory Visit (HOSPITAL_COMMUNITY): Payer: Medicare Other

## 2017-10-07 ENCOUNTER — Ambulatory Visit (INDEPENDENT_AMBULATORY_CARE_PROVIDER_SITE_OTHER): Payer: Medicare Other | Admitting: Allergy

## 2017-10-07 DIAGNOSIS — T63441D Toxic effect of venom of bees, accidental (unintentional), subsequent encounter: Secondary | ICD-10-CM | POA: Diagnosis not present

## 2017-10-08 ENCOUNTER — Ambulatory Visit (HOSPITAL_COMMUNITY)
Admission: RE | Admit: 2017-10-08 | Discharge: 2017-10-08 | Disposition: A | Discharge status: Home / Self Care | Payer: Medicare Other | Source: Ambulatory Visit | Attending: Family Medicine | Admitting: Family Medicine

## 2017-10-08 DIAGNOSIS — Z1231 Encounter for screening mammogram for malignant neoplasm of breast: Secondary | ICD-10-CM

## 2017-10-12 NOTE — Progress Notes (Signed)
Chief Complaint  Patient presents with  . Hypertension    fasting med check. No concerns.  . Flu Vaccine    allergic.    Seen in September with acute right sided low back pain, tender at R SI joint. It took a few weeks to resolve, but no longer is having any pain.  Anxiety--related to diagnosis of atrial fibrillation. On citalopram and doing very well. Not needing any alprazolam. Denies side effects to citalopram. Anxiety was triggered in the past by having a nosebleed (causing very high BP, related to anxiety), so previously had said she wanted to stay on the citalopram. At her last visit in June, she reported being willing to consider coming down/off as a trial. She was able to get totally off, but after being off it a week, she had increased anxiety regarding her pulse and checking BP's, realized she was more anxious, and restarted it.  Her anxiety is better since being back on the medication (tapered off over 2 months, back taking for about 4 months now).  Atrial fibrillation--diagnosed 11/2014.  Last saw cardiology in July and no changes were made her her regimen. She continues on Eliquis. She denies chest pain, palpitations, bleeding, bruising.  Hypertension--currently well controlled on Cartia and Losartan.  Medications are refilled by Dr. Harl Bowie. BP yesterday was 134/73, mostly they run in the 120's  Hyperlipidemia: Controlled by diet, which remains the same--she has continued to limit her cheese intake, continuing to eat more fruits and vegetables. She cut out the creamy dressings. She mostly eats chicken and some pork, only has a burger on the grill once a month, if even that often. Has tuna without mayonnaise (uses vinegar). Lab Results  Component Value Date   CHOL 217 (H) 04/05/2017   HDL 100 04/05/2017   LDLCALC 103 (H) 04/05/2017   TRIG 71 04/05/2017   CHOLHDL 2.2 04/05/2017    Vitamin D deficiency: Last level was 30 in November 2017. She has been compliant with taking  MVI daily since last visit and Calcium with D once daily. She admits to forgetting the MVI frequently, but will work harder to remember.  Allergies: Durward Fortes when she goes for her shots, and just seasonally (end of summer, not needing now). She continues on allergy shots every 2 months.   PMH, PSH, SH reviewed  Outpatient Encounter Medications as of 10/14/2017  Medication Sig Note  . Calcium Carbonate-Vitamin D (CALCIUM 600+D) 600-400 MG-UNIT per tablet Take 1 tablet by mouth daily.  10/14/2017: Takes only 2-3 times/week  . CARTIA XT 180 MG 24 hr capsule TAKE 1 CAPSULE BY MOUTH ONCE DAILY **MEDICATION  TO  CONTROL  YOUR  HEART  RATE**   . citalopram (CELEXA) 20 MG tablet Take 1 tablet (20 mg total) by mouth daily.   . diphenhydrAMINE (BENADRYL) 25 MG tablet Take 25 mg by mouth every 6 (six) hours as needed. 04/05/2017: Uses prn  . ELIQUIS 5 MG TABS tablet TAKE ONE TABLET BY MOUTH TWICE DAILY   . losartan (COZAAR) 25 MG tablet TAKE 1 TABLET BY MOUTH ONCE DAILY   . Multiple Vitamins-Minerals (MULTIVITAMIN WITH MINERALS) tablet Take 1 tablet by mouth daily.   10/14/2017: Takes sporadically  . [DISCONTINUED] citalopram (CELEXA) 20 MG tablet Take 1 tablet (20 mg total) by mouth daily.   [name] Kitchen EPINEPHrine (EPIPEN 2-PAK) 0.3 mg/0.3 mL IJ SOAJ injection Inject 0.3 mg into the muscle once. Reported on 03/09/2016   . fexofenadine (ALLEGRA) 180 MG tablet Take 180 mg by mouth  daily as needed for allergies or rhinitis. Reported on 02/03/2016 03/09/2016: Takes on the day of her allergy shots and prn allergy symptoms  . [DISCONTINUED] metaxalone (SKELAXIN) 800 MG tablet Take 0.5-1 tablets (400-800 mg total) by mouth 3 (three) times daily as needed for muscle spasms. (Patient not taking: Reported on 10/14/2017)   . [DISCONTINUED] predniSONE (DELTASONE) 20 MG tablet Take 1 tablet (20 mg total) by mouth 2 (two) times daily with a meal.   . [DISCONTINUED] traMADol (ULTRAM) 50 MG tablet Take 1 tablet (50 mg total) by  mouth every 8 (eight) hours as needed for severe pain. (Patient not taking: Reported on 10/14/2017)    No facility-administered encounter medications on file as of 10/14/2017.    Allergies  Allergen Reactions  . Bee Venom Anaphylaxis  . Epinephrine Other (See Comments)    High pulse rate.  . Sulfa Antibiotics Nausea And Vomiting  . Tetanus Toxoids Swelling    Entire arm swelled  . Vancomycin Nausea And Vomiting  . Adhesive [Tape] Rash    Her skin peels off.  . Influenza Vaccine Live Swelling and Rash  . Ivp Dye [Iodinated Diagnostic Agents] Rash  . Zyloprim [Allopurinol] Rash    ROS:  Denies fever, chills, headaches, dizziness, chest pain, palpitations, shortness of breath, cough. Alergies aren't flaring currently.  Denies nausea, vomiting, heartburn, abdominal pain, bowel changes, urinary complaints, depression, insomnia.  Denies joint pains (slight in hands). Anxiety is well controlled (since being back on citalopram).   PHYSICAL EXAM:  BP 140/80   Pulse 88   Ht 5' 2.5" (1.588 m)   Wt 204 lb 9.6 oz (92.8 kg)   BMI 36.83 kg/m   Wt Readings from Last 3 Encounters:  10/14/17 204 lb 9.6 oz (92.8 kg)  07/19/17 206 lb 9.6 oz (93.7 kg)  07/12/17 207 lb (93.9 kg)    Well developed, pleasant, obese female in no distress. Somewhat excitable in chatting (blood pressure went up to 150/80 when rechecked by MD).  HEENT: PERRL, EOMI, conjunctiva and sclera are clear. EOMI. OP clear Neck: no lymphadenopathy, thyromegaly or mass Heart: regular rate and rhythm, no murmur. Lungs: clear bilaterally Back: no spinal or CVA tenderness Abdomen: obese, soft, nontender Extremities: no edema Psych: normal mood, affect, hygiene and grooming Neuro: alert and oriented, cranial nerves intact, normal gait.   ASSESSMENT/PLAN:  Essential hypertension - normal at home, continue home monitoring.  Atrial fibrillation with rapid ventricular response (HCC) - currently in NSR, normal rate. Continue  Eliquis and current meds - Plan: CBC with Differential/Platelet  Vitamin D deficiency - noncompliant with daily supplements. Discussed ways to try and get adequate D/week, and Ca through diet. plan to recheck at physical  Pure hypercholesterolemia - diet controlled, at goal on last check. Check at her physical, continue lowfat, low cholesterol diet  Generalized anxiety disorder - well controlled; had recurrent anxiety when tapered off. Continue long-term - Plan: citalopram (CELEXA) 20 MG tablet  Obesity (BMI 30-39.9) - weight loss encouraged  Encounter for current long-term use of anticoagulants - Plan: CBC with Differential/Platelet    Lipids were fine 6 months ago, check yearly (not on medications, diet hasn't changed). Suspect D will be low, hasn't been consistently taking her vitamins.  She will start taking them regularly (switching to a separate D3 rather than the Calcium with D, so taking sporadically at higher doses might work better).  CBC today  Try and get more calcium in your diet. Take a separate vtamin D3.  Go ahead  and buy the 2000 IU.  If you remember to take this daily, great.  If you don't, it is okay to double up and it 4000 IU 2-3 times/week. Goal is 7000-10000 IU/week.  You want to get 1200mg  of calcium daily, either through your diet, and/or supplements.  Your multivitamin has some vitamin D and some calcium, but you aren't taking this daily either. You can also use Tums as a calcium supplement.    Letter for normal CBC, call if abnormal

## 2017-10-14 ENCOUNTER — Encounter: Payer: Self-pay | Admitting: Family Medicine

## 2017-10-14 ENCOUNTER — Ambulatory Visit (INDEPENDENT_AMBULATORY_CARE_PROVIDER_SITE_OTHER): Payer: Medicare Other | Admitting: Family Medicine

## 2017-10-14 VITALS — BP 140/80 | HR 88 | Ht 62.5 in | Wt 204.6 lb

## 2017-10-14 DIAGNOSIS — E559 Vitamin D deficiency, unspecified: Secondary | ICD-10-CM

## 2017-10-14 DIAGNOSIS — E7800 Pure hypercholesterolemia, unspecified: Secondary | ICD-10-CM | POA: Diagnosis not present

## 2017-10-14 DIAGNOSIS — Z7901 Long term (current) use of anticoagulants: Secondary | ICD-10-CM | POA: Diagnosis not present

## 2017-10-14 DIAGNOSIS — E669 Obesity, unspecified: Secondary | ICD-10-CM | POA: Diagnosis not present

## 2017-10-14 DIAGNOSIS — F411 Generalized anxiety disorder: Secondary | ICD-10-CM | POA: Diagnosis not present

## 2017-10-14 DIAGNOSIS — I10 Essential (primary) hypertension: Secondary | ICD-10-CM | POA: Diagnosis not present

## 2017-10-14 DIAGNOSIS — I4891 Unspecified atrial fibrillation: Secondary | ICD-10-CM

## 2017-10-14 LAB — CBC WITH DIFFERENTIAL/PLATELET
BASOS ABS: 59 {cells}/uL (ref 0–200)
BASOS PCT: 0.8 %
Eosinophils Absolute: 0 cells/uL — ABNORMAL LOW (ref 15–500)
Eosinophils Relative: 0 %
HCT: 40.4 % (ref 35.0–45.0)
HEMOGLOBIN: 14.1 g/dL (ref 11.7–15.5)
LYMPHS ABS: 2412 {cells}/uL (ref 850–3900)
MCH: 32.7 pg (ref 27.0–33.0)
MCHC: 34.9 g/dL (ref 32.0–36.0)
MCV: 93.7 fL (ref 80.0–100.0)
MONOS PCT: 7.9 %
MPV: 10.2 fL (ref 7.5–12.5)
NEUTROS ABS: 4344 {cells}/uL (ref 1500–7800)
Neutrophils Relative %: 58.7 %
PLATELETS: 322 10*3/uL (ref 140–400)
RBC: 4.31 10*6/uL (ref 3.80–5.10)
RDW: 12 % (ref 11.0–15.0)
TOTAL LYMPHOCYTE: 32.6 %
WBC: 7.4 10*3/uL (ref 3.8–10.8)
WBCMIX: 585 {cells}/uL (ref 200–950)

## 2017-10-14 MED ORDER — CITALOPRAM HYDROBROMIDE 20 MG PO TABS: 20.0000 mg | ORAL_TABLET | Freq: Every day | ORAL | 3 refills | Status: DC

## 2017-10-14 NOTE — Patient Instructions (Signed)
  Try and get more calcium in your diet. Take a separate vtamin D3.  Go ahead and buy the 2000 IU.  If you remember to take this daily, great.  If you don't, it is okay to double up and it 4000 IU 2-3 times/week. Goal is 7000-10000 IU/week.  You want to get 1200mg  of calcium daily, either through your diet, and/or supplements.  Your multivitamin has some vitamin D and some calcium, but you aren't taking this daily either. You can also use Tums as a calcium supplement.

## 2017-10-18 ENCOUNTER — Telehealth: Payer: Self-pay | Admitting: Cardiology

## 2017-10-18 NOTE — Telephone Encounter (Signed)
Eliquis 5 mg samples , 2 boxes, lot [id number], exp 01/2020

## 2017-10-18 NOTE — Telephone Encounter (Signed)
Pt would like to know if she can get a 10 day sample supply of Eliquis to get her through till Jan, she's in the doughnut hole. It will save her over $100

## 2017-10-21 ENCOUNTER — Encounter: Payer: Medicare Other | Admitting: Family Medicine

## 2017-11-17 ENCOUNTER — Ambulatory Visit: Payer: Medicare Other | Admitting: Nurse Practitioner

## 2017-12-01 ENCOUNTER — Ambulatory Visit: Payer: Medicare Other | Admitting: Nurse Practitioner

## 2017-12-01 ENCOUNTER — Telehealth: Payer: Self-pay

## 2017-12-01 ENCOUNTER — Encounter: Payer: Self-pay | Admitting: *Deleted

## 2017-12-01 ENCOUNTER — Other Ambulatory Visit: Payer: Self-pay | Admitting: *Deleted

## 2017-12-01 ENCOUNTER — Encounter: Payer: Self-pay | Admitting: Nurse Practitioner

## 2017-12-01 ENCOUNTER — Other Ambulatory Visit: Payer: Self-pay | Admitting: Cardiology

## 2017-12-01 DIAGNOSIS — Z86010 Personal history of colonic polyps: Secondary | ICD-10-CM

## 2017-12-01 DIAGNOSIS — R69 Illness, unspecified: Secondary | ICD-10-CM

## 2017-12-01 MED ORDER — NA SULFATE-K SULFATE-MG SULF 17.5-3.13-1.6 GM/177ML PO SOLN: 1.0000 | ORAL | 0 refills | Status: DC

## 2017-12-01 NOTE — Assessment & Plan Note (Signed)
Per patient previous colonoscopy she has a history of colon polyps and is deemed high risk for surveillance.  Her last colonoscopy in 2013 found no polyps.  Full colonoscopy results per HPI.  At this point she is due for repeat colonoscopy.  We will proceed with scheduling.  Proceed with colonoscopy with Dr. Oneida Alar in the near future. The risks, benefits, and alternatives have been discussed in detail with the patient. They state understanding and desire to proceed.   The patient is not on any anxiolytics, chronic pain medications, or antidepressants.  She is on Eliquis and we will recheck the cardiology for the okay to hold this prior to her procedure.  Conscious sedation should be adequate for her procedure.

## 2017-12-01 NOTE — Assessment & Plan Note (Signed)
The patient has a history of atrial fibrillation with one recorded episode in 2016.  She has been on Eliquis since.  Due to upcoming colonoscopy we will request the okay from her cardiologist to hold Eliquis for 48 hours prior to her procedure.  Likely able to start her medication back the day after or day of the procedure, pending results.

## 2017-12-01 NOTE — Telephone Encounter (Signed)
Called pt and TCS with [facility] is scheduled for 12/31/17 at 10:30am. Patient requests suprep to be called in. She called her insurance and was told they would cover this. Instructions mailed to pt for prep. Patient verbalized understanding to hole eliquis 48 hrs prior to procedure.

## 2017-12-01 NOTE — Telephone Encounter (Signed)
Noted, routing message 

## 2017-12-01 NOTE — Telephone Encounter (Signed)
Dr. Harl Bowie,  [place], [place] is recommending a colonoscopy for pt with Dr. Oneida Alar. Is it ok to hold Eliquis x 48 hours. Please advise.

## 2017-12-01 NOTE — Patient Instructions (Addendum)
1. We will tentatively schedule your colonoscopy for you. 2. We will Reach out to Dr. Harl Bowie with permission to hold Eliquis for 48 hours.  When we get this okay we will call you with confirmation. 3. Return for follow-up based on recommendations made after your procedure. 4. Call us if you have any questions or concerns.

## 2017-12-01 NOTE — Telephone Encounter (Signed)
Yes, ok to hold eliquis for procedure   Zandra Abts MD

## 2017-12-01 NOTE — Progress Notes (Addendum)
REVIEWED-NO ADDITIONAL RECOMMENDATIONS.  Primary Care Physician:  Rita Ohara, MD Primary Gastroenterologist:  Dr. Oneida Alar  Chief Complaint  Patient presents with  . Colonoscopy    consult    HPI:   Monique James is a 77 y.o. female who presents on referral for colonoscopy.  The patient phone triage was deferred to an office visit due to high risk features including anticoagulation with Eliquis.  Per telephone notes the patient was previously with [facility] but lives in [place], [place] and would prefer to come here for colonoscopy.   Last colonoscopy completed 04/13/2012 for high risk surveillance due to personal history of colon polyps.  Findings include multiple small and large mouth diverticula in the sigmoid colon and descending colon, internal hemorrhoids.  Recommended repeat colonoscopy in 6 years for surveillance.  Today she states she's doing good overall. Denies abdominal pain, N/V, hematochezia, melena, fever, chills, unintentional weight loss. Energy is good, no sudden/worsening fatigue. Denies acute changes in bowel movements/habits. Denies chest pain, dyspnea, dizziness, lightheadedness, syncope, near syncope. Denies any other upper or lower GI symptoms.  She is on Eliquis due to AFib, unknown frequency.  Past Medical History:  Diagnosis Date  . Anxiety   . Atrial fibrillation with rapid ventricular response ([place]) 11/05/2014   New dx  . 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 Shaune Leeks)  . Colon polyps    Dr. Oletta Lamas  . Colon polyps    path was small leiomyoma  . Constipation   . Gout    history of (1985ish)  . Hemorrhoids   . History of IBS   . Hx of pelvic mass    complex, benign pathology  . Hyperlipidemia    elevated trigs and LDL  . Hypertension   . Hyponatremia    mild  . Macular degeneration of right eye    Dr. Mikey Bussing (mild)--MISDIAGNOSED; 12/2013 told scarring, NOT macular degeneration  . Osteopenia    mild (T-1.3 in 01/2014)  . Shingles  02/2015    Past Surgical History:  Procedure Laterality Date  . ABDOMINAL HYSTERECTOMY  1970   for perforated uterus from IUD  . BILATERAL SALPINGOOPHORECTOMY  2005   benign tumor/cyst (Dr. Aldean Ast)    Current Outpatient Medications  Medication Sig Dispense Refill  . Calcium Carbonate-Vitamin D (CALCIUM 600+D) 600-400 MG-UNIT per tablet Take 1 tablet by mouth daily.     [name] Kitchen CARTIA XT 180 MG 24 hr capsule TAKE 1 CAPSULE BY MOUTH ONCE DAILY **MEDICATION  TO  CONTROL  YOUR  HEART  RATE** 90 capsule 1  . citalopram (CELEXA) 20 MG tablet Take 1 tablet (20 mg total) by mouth daily. 90 tablet 3  . diphenhydrAMINE (BENADRYL) 25 MG tablet Take 25 mg by mouth every 6 (six) hours as needed.    [name] Kitchen ELIQUIS 5 MG TABS tablet TAKE 1 TABLET BY MOUTH TWICE DAILY 60 tablet 6  . EPINEPHrine (EPIPEN 2-PAK) 0.3 mg/0.3 mL IJ SOAJ injection Inject 0.3 mg into the muscle once. Reported on 03/09/2016    . fexofenadine (ALLEGRA) 180 MG tablet Take 180 mg by mouth daily as needed for allergies or rhinitis. Reported on 02/03/2016    . losartan (COZAAR) 25 MG tablet TAKE 1 TABLET BY MOUTH ONCE DAILY 90 tablet 1  . Multiple Vitamins-Minerals (MULTIVITAMIN WITH MINERALS) tablet Take 1 tablet by mouth daily.       No current facility-administered medications for this visit.     Allergies as of 12/01/2017 - Review Complete  12/01/2017  Allergen Reaction Noted  . Bee venom Anaphylaxis 07/30/2011  . Epinephrine Other (See Comments) 07/28/2011  . Sulfa antibiotics Nausea And Vomiting 07/28/2011  . Tetanus toxoids Swelling 08/04/2012  . Vancomycin Nausea And Vomiting 07/28/2011  . Adhesive [tape] Rash 02/04/2012  . Influenza vaccine live Swelling and Rash 07/30/2011  . Ivp dye [iodinated diagnostic agents] Rash 07/28/2011  . Zyloprim [allopurinol] Rash 07/28/2011    Family History  Problem Relation Age of Onset  . Hypertension Mother   . Heart disease Mother        MI  . Dementia Mother   . Cancer Father         stomach  . Cancer Brother        multiple myeloma  . Healthy Daughter   . Healthy Daughter   . Healthy Daughter   . Diabetes Neg Hx   . Breast cancer Neg Hx   . Colon cancer Neg Hx     Social History   Socioeconomic History  . Marital status: Widowed    Spouse name: Not on file  . Number of children: 3  . Years of education: Not on file  . Highest education level: Not on file  Social Needs  . Financial resource strain: Not on file  . Food insecurity - worry: Not on file  . Food insecurity - inability: Not on file  . Transportation needs - medical: Not on file  . Transportation needs - non-medical: Not on file  Occupational History  . Occupation: retired Therapist, sports  Tobacco Use  . Smoking status: Never Smoker  . Smokeless tobacco: Never Used  Substance and Sexual Activity  . Alcohol use: Yes    Alcohol/week: 8.4 oz    Types: 14 Glasses of wine per week    Comment: 1 glass of wine daily, occasionally 2  . Drug use: No  . Sexual activity: Not Currently  Other Topics Concern  . Not on file  Social History Narrative   Lives alone, 2 dogs;  Daughter lives nearby.  3 grandchildren.  Other children in [place].   Widowed (age 89--husband had aortic aneurysm repair, and graft failed/ruptured)--death anniversary date 10/23/2023.  He had been a Holocaust survivor.    Review of Systems: Complete ROS negative except as per HPI.    Physical Exam: BP (!) 144/89   Pulse 90   Temp (!) 97.2 F (36.2 C) (Oral)   Ht _0  (1.626 m)   Wt 207 lb 9.6 oz (94.2 kg)   BMI 35.63 kg/m  General:   Obese female. Alert and oriented. Pleasant and cooperative. Well-nourished and well-developed.  Eyes:  Without icterus, sclera clear and conjunctiva pink.  Ears:  Normal auditory acuity. Cardiovascular:  S1, S2 present without murmurs appreciated. Extremities without clubbing or edema. Respiratory:  Clear to auscultation bilaterally. No wheezes, rales, or rhonchi. No distress.    Gastrointestinal:  +BS, obese but soft, non-tender and non-distended. No HSM noted. No guarding or rebound. No masses appreciated.  Rectal:  Deferred  Musculoskalatal:  Symmetrical without gross deformities. Neurologic:  Alert and oriented x4;  grossly normal neurologically. Psych:  Alert and cooperative. Normal mood and affect. Heme/Lymph/Immune: No excessive bruising noted.    12/01/2017 10:45 AM   Disclaimer: This note was dictated with voice recognition software. Similar sounding words can inadvertently be transcribed and may not be corrected upon review.

## 2017-12-02 ENCOUNTER — Ambulatory Visit (INDEPENDENT_AMBULATORY_CARE_PROVIDER_SITE_OTHER): Payer: Medicare Other

## 2017-12-02 DIAGNOSIS — T63441D Toxic effect of venom of bees, accidental (unintentional), subsequent encounter: Secondary | ICD-10-CM | POA: Diagnosis not present

## 2017-12-02 NOTE — Telephone Encounter (Signed)
Noted, thank you

## 2017-12-02 NOTE — Progress Notes (Signed)
cc'ed to pcp °

## 2017-12-31 ENCOUNTER — Other Ambulatory Visit: Payer: Self-pay

## 2017-12-31 ENCOUNTER — Encounter (HOSPITAL_COMMUNITY): Payer: Self-pay | Admitting: *Deleted

## 2017-12-31 ENCOUNTER — Ambulatory Visit (HOSPITAL_COMMUNITY)
Admission: RE | Admit: 2017-12-31 | Discharge: 2017-12-31 | Disposition: A | Discharge status: Home / Self Care | Payer: Medicare Other | Source: Ambulatory Visit | Attending: Gastroenterology | Admitting: Gastroenterology

## 2017-12-31 ENCOUNTER — Encounter (HOSPITAL_COMMUNITY)
Admission: RE | Disposition: A | Discharge status: Home / Self Care | Payer: Self-pay | Source: Ambulatory Visit | Attending: Gastroenterology

## 2017-12-31 DIAGNOSIS — Z86010 Personal history of colonic polyps: Secondary | ICD-10-CM | POA: Diagnosis not present

## 2017-12-31 DIAGNOSIS — K644 Residual hemorrhoidal skin tags: Secondary | ICD-10-CM | POA: Diagnosis not present

## 2017-12-31 DIAGNOSIS — F419 Anxiety disorder, unspecified: Secondary | ICD-10-CM | POA: Diagnosis not present

## 2017-12-31 DIAGNOSIS — Z1211 Encounter for screening for malignant neoplasm of colon: Secondary | ICD-10-CM | POA: Diagnosis not present

## 2017-12-31 DIAGNOSIS — K5730 Diverticulosis of large intestine without perforation or abscess without bleeding: Secondary | ICD-10-CM | POA: Insufficient documentation

## 2017-12-31 DIAGNOSIS — D120 Benign neoplasm of cecum: Secondary | ICD-10-CM

## 2017-12-31 DIAGNOSIS — I4891 Unspecified atrial fibrillation: Secondary | ICD-10-CM | POA: Diagnosis not present

## 2017-12-31 DIAGNOSIS — I10 Essential (primary) hypertension: Secondary | ICD-10-CM | POA: Insufficient documentation

## 2017-12-31 DIAGNOSIS — Z7901 Long term (current) use of anticoagulants: Secondary | ICD-10-CM | POA: Diagnosis not present

## 2017-12-31 DIAGNOSIS — Z79899 Other long term (current) drug therapy: Secondary | ICD-10-CM | POA: Insufficient documentation

## 2017-12-31 HISTORY — PX: COLONOSCOPY: SHX5424

## 2017-12-31 SURGERY — COLONOSCOPY
Anesthesia: Moderate Sedation

## 2017-12-31 MED ORDER — MEPERIDINE HCL 100 MG/ML IJ SOLN
INTRAMUSCULAR | Status: AC
  Filled 2017-12-31: qty 1

## 2017-12-31 MED ORDER — MIDAZOLAM HCL 5 MG/5ML IJ SOLN
INTRAMUSCULAR | Status: AC
  Filled 2017-12-31: qty 5

## 2017-12-31 MED ORDER — MEPERIDINE HCL 100 MG/ML IJ SOLN
INTRAMUSCULAR | Status: DC | PRN
  Administered 2017-12-31: 25 mg
  Administered 2017-12-31: 50 mg
  Administered 2017-12-31: 25 mg

## 2017-12-31 MED ORDER — MIDAZOLAM HCL 5 MG/5ML IJ SOLN
INTRAMUSCULAR | Status: DC | PRN
  Administered 2017-12-31 (×2): 2 mg via INTRAVENOUS
  Administered 2017-12-31: 1 mg via INTRAVENOUS

## 2017-12-31 MED ORDER — STERILE WATER FOR IRRIGATION IR SOLN
Status: DC | PRN
  Administered 2017-12-31: 2.5 mL

## 2017-12-31 MED ORDER — SODIUM CHLORIDE 0.9 % IV SOLN
INTRAVENOUS | Status: DC
  Administered 2017-12-31: 10:00:00 via INTRAVENOUS

## 2017-12-31 NOTE — H&P (Addendum)
Primary Care Physician:  Rita Ohara, MD Primary Gastroenterologist:  Dr. Oneida Alar  Pre-Procedure History & Physical: HPI:  Monique James is a 77 y.o. female here for PERSONAL HISTORY OF POLYPS.  Past Medical History:  Diagnosis Date  . Anxiety   . Atrial fibrillation with rapid ventricular response ([place]) 11/05/2014   New dx  . 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 Shaune Leeks)  . Colon polyps    Dr. Oletta Lamas  . Colon polyps    path was small leiomyoma  . Constipation   . Gout    history of (1985ish)  . Hemorrhoids   . History of IBS   . Hx of pelvic mass    complex, benign pathology  . Hyperlipidemia    elevated trigs and LDL  . Hypertension   . Hyponatremia    mild  . Osteopenia    mild (T-1.3 in 01/2014)  . Shingles 02/2015   Past Surgical History:  Procedure Laterality Date  . ABDOMINAL HYSTERECTOMY  1970   for perforated uterus from IUD  . BILATERAL SALPINGOOPHORECTOMY  2005   benign tumor/cyst (Dr. Aldean Ast)    Prior to Admission medications   Medication Sig Start Date End Date Taking? Authorizing Provider  acetaminophen (TYLENOL) 325 MG tablet Take 650 mg by mouth every 6 (six) hours as needed for moderate pain or headache.   Yes [provider]  CARTIA XT 180 MG 24 hr capsule TAKE 1 CAPSULE BY MOUTH ONCE DAILY **MEDICATION  TO  CONTROL  YOUR  HEART  RATE** Patient taking differently: Take 180 mg by mouth once daily 08/16/17  Yes Branch, Alphonse Guild, MD  Cholecalciferol (VITAMIN D3 PO) Take 1 capsule by mouth daily.   Yes [provider]  citalopram (CELEXA) 20 MG tablet Take 1 tablet (20 mg total) by mouth daily. 10/14/17  Yes Rita Ohara, MD  ELIQUIS 5 MG TABS tablet TAKE 1 TABLET BY MOUTH TWICE DAILY Patient taking differently: Take 5 mg by mouth twice daily 12/01/17  Yes Branch, Alphonse Guild, MD  fexofenadine (ALLEGRA) 180 MG tablet Take 180 mg by mouth daily as needed for allergies or rhinitis.    Yes [provider]  losartan  (COZAAR) 25 MG tablet TAKE 1 TABLET BY MOUTH ONCE DAILY Patient taking differently: TAKE 25 MG BY MOUTH ONCE DAILY 08/16/17  Yes Branch, Alphonse Guild, MD  Multiple Vitamins-Minerals (MULTIVITAMIN WITH MINERALS) tablet Take 1 tablet by mouth daily.     Yes [provider]  Na Sulfate-K Sulfate-Mg Sulf 17.5-3.13-1.6 GM/177ML SOLN Take 1 kit by mouth as directed. 12/01/17  Yes Fields, Marga Melnick, MD  diphenhydrAMINE (BENADRYL) 25 MG tablet Take 25 mg by mouth every 6 (six) hours as needed for itching or allergies.     [provider]  EPINEPHrine (EPIPEN 2-PAK) 0.3 mg/0.3 mL IJ SOAJ injection Inject 0.3 mg into the muscle once.     [provider]    Allergies as of 12/01/2017 - Review Complete 12/01/2017  Allergen Reaction Noted  . Bee venom Anaphylaxis 07/30/2011  . Epinephrine Other (See Comments) 07/28/2011  . Sulfa antibiotics Nausea And Vomiting 07/28/2011  . Tetanus toxoids Swelling 08/04/2012  . Vancomycin Nausea And Vomiting 07/28/2011  . Adhesive [tape] Rash 02/04/2012  . Influenza vaccine live Swelling and Rash 07/30/2011  . Ivp dye [iodinated diagnostic agents] Rash 07/28/2011  . Zyloprim [allopurinol] Rash 07/28/2011    Family History  Problem Relation Age of Onset  . Hypertension Mother   .  Heart disease Mother        MI  . Dementia Mother   . Cancer Father        stomach  . Cancer Brother        multiple myeloma  . Healthy Daughter   . Healthy Daughter   . Healthy Daughter   . Diabetes Neg Hx   . Breast cancer Neg Hx   . Colon cancer Neg Hx     Social History   Socioeconomic History  . Marital status: Widowed    Spouse name: Not on file  . Number of children: 3  . Years of education: Not on file  . Highest education level: Not on file  Social Needs  . Financial resource strain: Not on file  . Food insecurity - worry: Not on file  . Food insecurity - inability: Not on file  . Transportation needs - medical: Not on file  .  Transportation needs - non-medical: Not on file  Occupational History  . Occupation: retired Therapist, sports  Tobacco Use  . Smoking status: Never Smoker  . Smokeless tobacco: Never Used  Substance and Sexual Activity  . Alcohol use: Yes    Alcohol/week: 8.4 oz    Types: 14 Glasses of wine per week    Comment: 1 glass of wine daily, occasionally 2  . Drug use: No  . Sexual activity: Not Currently  Other Topics Concern  . Not on file  Social History Narrative   Lives alone, 2 dogs;  Daughter lives nearby.  3 grandchildren.  Other children in [place].   Widowed (age 41--husband had aortic aneurysm repair, and graft failed/ruptured)--death anniversary date 06-Nov-2023.  He had been a Holocaust survivor.    Review of Systems: See HPI, otherwise negative ROS   Physical Exam: BP 129/77   Pulse 77   Temp 97.7 F (36.5 C) (Oral)   Resp 18   Ht _0  (1.626 m)   Wt 202 lb (91.6 kg)   SpO2 97%   BMI 34.67 kg/m  General:   Alert,  pleasant and cooperative in NAD Head:  Normocephalic and atraumatic. Neck:  Supple; Lungs:  Clear throughout to auscultation.    Heart:  Regular rate and rhythm. Abdomen:  Soft, nontender and nondistended. Normal bowel sounds, without guarding, and without rebound.   Neurologic:  Alert and  oriented x4;  grossly normal neurologically.  Impression/Plan:     SCREENING  Plan:  1. TCS TODAY. DISCUSSED PROCEDURE, BENEFITS, & RISKS: < 1% chance of medication reaction, bleeding, perforation, or rupture of spleen/liver.

## 2017-12-31 NOTE — Op Note (Signed)
[facility] Patient Name: Monique James Procedure Date: 12/31/2017 10:04 AM MRN: [id number] Date of Birth: 10/24/1941 Attending MD: Barney Drain MD, MD CSN: [id number] Age: 77 Admit Type: Outpatient Procedure:                Colonoscopy WITH COLD SNARE POLYPECTOMY Indications:              Personal history of colonic polyps Providers:                Barney Drain MD, MD, Janeece Riggers, RN, Nelma Rothman,                            Technician Referring MD:             Rita Ohara Medicines:                Meperidine 75 mg IV, Midazolam 5 mg IV Complications:            No immediate complications. Estimated Blood Loss:     Estimated blood loss was minimal. Procedure:                Pre-Anesthesia Assessment:                           - Prior to the procedure, a History and Physical                            was performed, and patient medications and                            allergies were reviewed. The patient's tolerance of                            previous anesthesia was also reviewed. The risks                            and benefits of the procedure and the sedation                            options and risks were discussed with the patient.                            All questions were answered, and informed consent                            was obtained. Prior Anticoagulants: The patient has                            taken Eliquis (apixaban), last dose was 3 days                            prior to procedure. ASA Grade Assessment: II - A                            patient with mild systemic disease. After reviewing  the risks and benefits, the patient was deemed in                            satisfactory condition to undergo the procedure.                            After obtaining informed consent, the colonoscope                            was passed under direct vision. Throughout the                            procedure, the patient's blood  pressure, pulse, and                            oxygen saturations were monitored continuously. The                            EC-[id number] ([id number]) scope was introduced through                            the anus and advanced to the the cecum, identified                            by appendiceal orifice and ileocecal valve. The                            colonoscopy was somewhat difficult due to a                            tortuous colon. Successful completion of the                            procedure was aided by increasing the dose of                            sedation medication, straightening and shortening                            the scope to obtain bowel loop reduction and                            COLOWRAP. The patient tolerated the procedure                            fairly well. The quality of the bowel preparation                            was adequate to identify polyps 6 mm and larger in                            size. The ileocecal valve, appendiceal orifice, and  rectum were photographed. Scope In: 10:32:54 AM Scope Out: 10:53:50 AM Scope Withdrawal Time: 0 hours 17 minutes 7 seconds  Total Procedure Duration: 0 hours 20 minutes 56 seconds  Findings:      A 4 mm polyp was found in the cecum. The polyp was sessile. The polyp       was removed with a cold snare. Resection and retrieval were complete.      Multiple small and large-mouthed diverticula were found in the       recto-sigmoid colon and sigmoid colon.      External hemorrhoids were found during retroflexion. The hemorrhoids       were moderate. Impression:               - One 4 mm polyp in the cecum, removed with a cold                            snare. Resected and retrieved.                           - Diverticulosis in the recto-sigmoid colon and in                            the sigmoid colon.                           - External hemorrhoids. Moderate Sedation:       Moderate (conscious) sedation was administered by the endoscopy nurse       and supervised by the endoscopist. The following parameters were       monitored: oxygen saturation, heart rate, blood pressure, and response       to care. Total physician intraservice time was 36 minutes. Recommendation:           - Repeat colonoscopy in 5 years for surveillance.                           - High fiber diet.                           - Continue present medications.                           - Await pathology results.                           - Patient has a contact number available for                            emergencies. The signs and symptoms of potential                            delayed complications were discussed with the                            patient. Return to normal activities tomorrow.                            Written discharge  instructions were provided to the                            patient. Procedure Code(s):        --- Professional ---                           [phone], Colonoscopy, flexible; with removal of                            tumor(s), polyp(s), or other lesion(s) by snare                            technique                           99152, Moderate sedation services provided by the                            same physician or other qualified health care                            professional performing the diagnostic or                            therapeutic service that the sedation supports,                            requiring the presence of an independent trained                            observer to assist in the monitoring of the                            patient's level of consciousness and physiological                            status; initial 15 minutes of intraservice time,                            patient age 3 years or older                           [phone], Moderate sedation services; each additional                            15 minutes  intraservice time Diagnosis Code(s):        --- Professional ---                           D12.0, Benign neoplasm of cecum                           K64.4, Residual hemorrhoidal skin tags  Z86.010, Personal history of colonic polyps                           K57.30, Diverticulosis of large intestine without                            perforation or abscess without bleeding CPT copyright 2016 [employer]. All rights reserved. The codes documented in this report are preliminary and upon coder review may  be revised to meet current compliance requirements. Barney Drain, MD Barney Drain MD, MD 12/31/2017 11:01:37 AM This report has been signed electronically. Number of Addenda: 0

## 2017-12-31 NOTE — Discharge Instructions (Signed)
You had 1 polyp removed. You have  MODERATE EXTERNAL hemorrhoids.   HOLD ELIQUIS. RE-START MAR 2.  DRINK WATER TO KEEP YOUR URINE LIGHT YELLOW.  CONTINUE YOUR WEIGHT LOSS EFFORTS.  WHILE I DO NOT WANT TO ALARM YOU, YOUR BODY MASS INDEX IS OVER 30 WHICH MEANS YOU ARE OBESE. OBESITY CAN ACTIVATE CANCER GENES. OBESITY IS ASSOCIATED WITH AN INCREASED RISK FOR CIRRHOSIS AND ALL CANCERS, INCLUDING ESOPHAGEAL AND COLON CANCER. A WEIGHT OF 170 LBS  WILL GET YOUR BODY MASS INDEX(BMI) UNDER 30.  FOLLOW A HIGH FIBER DIET. AVOID ITEMS THAT CAUSE BLOATING & GAS. SEE INFO BELOW.  YOUR BIOPSY RESULTS WILL BE AVAILABLE IN MY CHART AFTER MAR 5 AND MY OFFICE WILL CONTACT YOU IN 10-14 DAYS WITH YOUR RESULTS.   THE BROTH YOU DRANK HAD OIL DROPLETS WHICH CREATES A FILM OVER THE SCOPE LENS. POLYPS LESS THAN 2-3 MM CAN BE MISSED IN THE RIGHT COLON BUT I WAS ABLE TO IRRIGATE AND RINSE TO GET A GOOD LOOK AT THE LEFT COLON. YOUR R OPTIONS ARE: 1. Next colonoscopy in 5 years DUE TO YOUR HISTORY OF POLYPS OR 2. TO REPEAT THE COLONOSCOPY WITHIN THE NEXT 2-3 MOS.   Colonoscopy Care After Read the instructions outlined below and refer to this sheet in the next week. These discharge instructions provide you with general information on caring for yourself after you leave the hospital. While your treatment has been planned according to the most current medical practices available, unavoidable complications occasionally occur. If you have any problems or questions after discharge, call DR. FIELDS, [phone].  ACTIVITY  You may resume your regular activity, but move at a slower pace for the next 24 hours.   Take frequent rest periods for the next 24 hours.   Walking will help get rid of the air and reduce the bloated feeling in your belly (abdomen).   No driving for 24 hours (because of the medicine (anesthesia) used during the test).   You may shower.   Do not sign any important legal documents or operate any  machinery for 24 hours (because of the anesthesia used during the test).    NUTRITION  Drink plenty of fluids.   You may resume your normal diet as instructed by your doctor.   Begin with a light meal and progress to your normal diet. Heavy or fried foods are harder to digest and may make you feel sick to your stomach (nauseated).   Avoid alcoholic beverages for 24 hours or as instructed.    MEDICATIONS  You may resume your normal medications.   WHAT YOU CAN EXPECT TODAY  Some feelings of bloating in the abdomen.   Passage of more gas than usual.   Spotting of blood in your stool or on the toilet paper  .  IF YOU HAD POLYPS REMOVED DURING THE COLONOSCOPY:  Eat a soft diet IF YOU HAVE NAUSEA, BLOATING, ABDOMINAL PAIN, OR VOMITING.    FINDING OUT THE RESULTS OF YOUR TEST Not all test results are available during your visit. DR. Oneida Alar WILL CALL YOU WITHIN 14 DAYS OF YOUR PROCEDUE WITH YOUR RESULTS. Do not assume everything is normal if you have not heard from DR. FIELDS, CALL HER OFFICE AT [phone].  SEEK IMMEDIATE MEDICAL ATTENTION AND CALL THE OFFICE: [phone] IF:  You have more than a spotting of blood in your stool.   Your belly is swollen (abdominal distention).   You are nauseated or vomiting.   You have a temperature over  101F.   You have abdominal pain or discomfort that is severe or gets worse throughout the day.   High-Fiber Diet A high-fiber diet changes your normal diet to include more whole grains, legumes, fruits, and vegetables. Changes in the diet involve replacing refined carbohydrates with unrefined foods. The calorie level of the diet is essentially unchanged. The Dietary Reference Intake (recommended amount) for adult males is 38 grams per day. For adult females, it is 25 grams per day. Pregnant and lactating women should consume 28 grams of fiber per day. Fiber is the intact part of a plant that is not broken down during digestion.  Functional fiber is fiber that has been isolated from the plant to provide a beneficial effect in the body. PURPOSE  Increase stool bulk.   Ease and regulate bowel movements.   Lower cholesterol.   REDUCE RISK OF COLON CANCER  INDICATIONS THAT YOU NEED MORE FIBER  Constipation and hemorrhoids.   Uncomplicated diverticulosis (intestine condition) and irritable bowel syndrome.   Weight management.   As a protective measure against hardening of the arteries (atherosclerosis), diabetes, and cancer.   GUIDELINES FOR INCREASING FIBER IN THE DIET  Start adding fiber to the diet slowly. A gradual increase of about 5 more grams (2 slices of whole-wheat bread, 2 servings of most fruits or vegetables, or 1 bowl of high-fiber cereal) per day is best. Too rapid an increase in fiber may result in constipation, flatulence, and bloating.   Drink enough water and fluids to keep your urine clear or pale yellow. Water, juice, or caffeine-free drinks are recommended. Not drinking enough fluid may cause constipation.   Eat a variety of high-fiber foods rather than one type of fiber.   Try to increase your intake of fiber through using high-fiber foods rather than fiber pills or supplements that contain small amounts of fiber.   The goal is to change the types of food eaten. Do not supplement your present diet with high-fiber foods, but replace foods in your present diet.   INCLUDE A VARIETY OF FIBER SOURCES  Replace refined and processed grains with whole grains, canned fruits with fresh fruits, and incorporate other fiber sources. White rice, white breads, and most bakery goods contain little or no fiber.   Brown whole-grain rice, buckwheat oats, and many fruits and vegetables are all good sources of fiber. These include: broccoli, Brussels sprouts, cabbage, cauliflower, beets, sweet potatoes, white potatoes (skin on), carrots, tomatoes, eggplant, squash, berries, fresh fruits, and dried fruits.    Cereals appear to be the richest source of fiber. Cereal fiber is found in whole grains and bran. Bran is the fiber-rich outer coat of cereal grain, which is largely removed in refining. In whole-grain cereals, the bran remains. In breakfast cereals, the largest amount of fiber is found in those with "bran" in their names. The fiber content is sometimes indicated on the label.   You may need to include additional fruits and vegetables each day.   In baking, for 1 cup white flour, you may use the following substitutions:   1 cup whole-wheat flour minus 2 tablespoons.   1/2 cup white flour plus 1/2 cup whole-wheat flour.   Polyps, Colon  A polyp is extra tissue that grows inside your body. Colon polyps grow in the large intestine. The large intestine, also called the colon, is part of your digestive system. It is a long, hollow tube at the end of your digestive tract where your body makes and stores stool.  Most polyps are not dangerous. They are benign. This means they are not cancerous. But over time, some types of polyps can turn into cancer. Polyps that are smaller than a pea are usually not harmful. But larger polyps could someday become or may already be cancerous. To be safe, doctors remove all polyps and test them.   WHO GETS POLYPS? Anyone can get polyps, but certain people are more likely than others. You may have a greater chance of getting polyps if:  You are over 50.   You have had polyps before.   Someone in your family has had polyps.   Someone in your family has had cancer of the large intestine.   Find out if someone in your family has had polyps. You may also be more likely to get polyps if you:   Eat a lot of fatty foods   Smoke   Drink alcohol   Do not exercise  Eat too much   PREVENTION There is not one sure way to prevent polyps. You might be able to lower your risk of getting them if you:  Eat more fruits and vegetables and less fatty food.   Do not  smoke.   Avoid alcohol.   Exercise every day.   Lose weight if you are overweight.   Eating more calcium and folate can also lower your risk of getting polyps. Some foods that are rich in calcium are milk, cheese, and broccoli. Some foods that are rich in folate are chickpeas, kidney beans, and spinach.   Hemorrhoids Hemorrhoids are dilated (enlarged) veins around the rectum. Sometimes clots will form in the veins. This makes them swollen and painful. These are called thrombosed hemorrhoids. Causes of hemorrhoids include:  Constipation.   Straining to have a bowel movement.   HEAVY LIFTING  HOME CARE INSTRUCTIONS  Eat a well balanced diet and drink 6 to 8 glasses of water every day to avoid constipation. You may also use a bulk laxative.   Avoid straining to have bowel movements.   Keep anal area dry and clean.   Do not use a donut shaped pillow or sit on the toilet for long periods. This increases blood pooling and pain.   Move your bowels when your body has the urge; this will require less straining and will decrease pain and pressure.

## 2018-01-03 ENCOUNTER — Telehealth: Payer: Self-pay | Admitting: Gastroenterology

## 2018-01-03 NOTE — Telephone Encounter (Signed)
Please call pt. She had ONE simple adenoma removed.    DRINK WATER TO KEEP YOUR URINE LIGHT YELLOW. CONTINUE YOUR WEIGHT LOSS EFFORTS. A WEIGHT OF 170 LBS  WILL GET YOUR BODY MASS INDEX(BMI) UNDER 30.  FOLLOW A HIGH FIBER DIET. AVOID ITEMS THAT CAUSE BLOATING & GAS.  REPEAT THE COLONOSCOPY WITHIN THE NEXT 2-3 MOS.

## 2018-01-03 NOTE — Telephone Encounter (Signed)
Pt would like to know why she needs another colonoscopy in 2-3 months.

## 2018-01-04 ENCOUNTER — Encounter: Payer: Self-pay | Admitting: Gastroenterology

## 2018-01-04 ENCOUNTER — Encounter (HOSPITAL_COMMUNITY): Payer: Self-pay | Admitting: Gastroenterology

## 2018-01-04 NOTE — Telephone Encounter (Signed)
Insurance should pay due to the fact she had a poor prep.    I spoke with the coding department to make sure the correct modifier was on the claim that would justify a sooner than later repeat tcs due to a poor prep.

## 2018-01-04 NOTE — Telephone Encounter (Signed)
Pt needs a colonoscopy in 2-3 mos because she had a colonoscopy with a poor prep and polyps less than 5 mm would have been missed. She is at risk for developing colon cancer due to a missed polyp.

## 2018-01-04 NOTE — Telephone Encounter (Signed)
Pt is aware. She just wants to know if her insurance will pay for it. I told her she will have to talk to them. She did have a polyp and a poor prep.  Rosendo Gros, is there anything else I need to tell her?

## 2018-01-04 NOTE — Telephone Encounter (Signed)
Reminder in epic to repeat colonoscopy in 5 yrs. Please let me know if it changes to 2-3 months.

## 2018-01-04 NOTE — Telephone Encounter (Signed)
PT is aware.

## 2018-01-04 NOTE — Telephone Encounter (Signed)
POOR PREP

## 2018-01-12 ENCOUNTER — Other Ambulatory Visit: Payer: Self-pay | Admitting: Cardiology

## 2018-01-27 ENCOUNTER — Ambulatory Visit (INDEPENDENT_AMBULATORY_CARE_PROVIDER_SITE_OTHER): Payer: Medicare Other | Admitting: *Deleted

## 2018-01-27 DIAGNOSIS — T63441D Toxic effect of venom of bees, accidental (unintentional), subsequent encounter: Secondary | ICD-10-CM

## 2018-02-14 ENCOUNTER — Other Ambulatory Visit: Payer: Self-pay | Admitting: Cardiology

## 2018-03-02 ENCOUNTER — Encounter: Payer: Self-pay | Admitting: Nurse Practitioner

## 2018-03-02 ENCOUNTER — Ambulatory Visit: Payer: Medicare Other | Admitting: Nurse Practitioner

## 2018-03-14 ENCOUNTER — Ambulatory Visit: Payer: Medicare Other | Admitting: Nurse Practitioner

## 2018-03-23 ENCOUNTER — Encounter: Payer: Self-pay | Admitting: *Deleted

## 2018-03-23 ENCOUNTER — Ambulatory Visit: Payer: Medicare Other | Admitting: Nurse Practitioner

## 2018-03-23 ENCOUNTER — Other Ambulatory Visit: Payer: Self-pay | Admitting: *Deleted

## 2018-03-23 ENCOUNTER — Encounter: Payer: Self-pay | Admitting: Nurse Practitioner

## 2018-03-23 VITALS — BP 144/88 | HR 85 | Temp 97.0°F | Ht 64.0 in | Wt 205.6 lb

## 2018-03-23 DIAGNOSIS — Z86010 Personal history of colonic polyps: Secondary | ICD-10-CM | POA: Diagnosis not present

## 2018-03-23 DIAGNOSIS — R69 Illness, unspecified: Secondary | ICD-10-CM

## 2018-03-23 DIAGNOSIS — Z1211 Encounter for screening for malignant neoplasm of colon: Secondary | ICD-10-CM

## 2018-03-23 MED ORDER — NA SULFATE-K SULFATE-MG SULF 17.5-3.13-1.6 GM/177ML PO SOLN: 1.0000 | ORAL | 0 refills | Status: DC

## 2018-03-23 NOTE — Assessment & Plan Note (Signed)
Noted history of colon polyps.  Last colonoscopy was completed but noted poor prep.  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  Recommended repeat colonoscopy in 2 to 3 months due to poor prep.  The patient insisted follow the instructions closely.  At this point we will plan for an extended prep including 2 days of clears, Linzess 72 mcg for 3 days prior to prep in addition to standard prep.  Previously cleared by cardiology to hold Eliquis for 48 hours prior to her procedure and we will repeat this for this procedure as well.  Return for follow-up based on post procedure recommendations.  Proceed with colonoscopy with Dr. Oneida Alar in the near future. The risks, benefits, and alternatives have been discussed in detail with the patient. They state understanding and desire to proceed.   The patient is currently on Celexa, Eliquis.  No other anticoagulants, anxiolytics, chronic pain medications, or antidepressants.  We will hold Eliquis for 48 hours prior to the procedure.  Conscious sedation should be adequate for her procedure as it was for her last.

## 2018-03-23 NOTE — Progress Notes (Signed)
Referring Provider: Rita Ohara, MD Primary Care Physician:  Rita Ohara, MD Primary GI:  Dr. Oneida Alar  Chief Complaint  Patient presents with  . other    Reschedule colonoscopy    HPI:   Monique James is a 77 y.o. female who presents to reschedule colonoscopy.  Patient was last seen in our office 12/01/2017 for history of colon polyps and multiple medications for chronic disease/polypharmacy.  Review his colonoscopy 04/13/2012 for high risk surveillance due to personal history of colon polyps which found multiple small and large bowel diverticula in the sigmoid colon and descending colon, internal hemorrhoids.  Recommended repeat colonoscopy in 6 years for surveillance.  At her last visit she was doing well overall, and no GI symptoms.  On Eliquis due to A. fib.  Recommended clearance from cardiology to hold Eliquis for 48 hours, tentatively schedule colonoscopy, follow-up based on post procedure recommendations.  Clearance was received from cardiology to hold Eliquis for procedure.  Anoscopy attempted 12/31/2017 which found a single 4 mm polyp in the cecum, diverticulosis, external hemorrhoids.  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  Recommended repeat colonoscopy in 2 to 3 months due to poor prep.  Today she states she's doing well overall. She followed her prep instructions carefully. Denies abdominal pain, N/V, hematochezia, melena, fever, unintentional weight loss. Denies chest pain, dyspnea, dizziness, lightheadedness, syncope, near syncope. Denies any other upper or lower GI symptoms.  Past Medical History:  Diagnosis Date  . Anxiety   . Atrial fibrillation with rapid ventricular response ([place]) 11/05/2014   New dx  . 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 Shaune Leeks)  . Colon polyps    Dr. Oletta Lamas  . Colon polyps    path was small leiomyoma  . Constipation   . Gout    history of (1985ish)  . Hemorrhoids   . History of IBS   . Hx of pelvic mass    complex, benign pathology  . Hyperlipidemia    elevated trigs and LDL  . Hypertension   . Hyponatremia    mild  . Osteopenia    mild (T-1.3 in 01/2014)  . Shingles 02/2015    Past Surgical History:  Procedure Laterality Date  . ABDOMINAL HYSTERECTOMY  1970   for perforated uterus from IUD  . BILATERAL SALPINGOOPHORECTOMY  2005   benign tumor/cyst (Dr. Aldean Ast)  . COLONOSCOPY N/A 12/31/2017   Procedure: COLONOSCOPY;  Surgeon: Danie Binder, MD;  Location: [facility];  Service: Endoscopy;  Laterality: N/A;  10:30am    Current Outpatient Medications  Medication Sig Dispense Refill  . acetaminophen (TYLENOL) 325 MG tablet Take 650 mg by mouth every 6 (six) hours as needed for moderate pain or headache.    [name] Kitchen CARTIA XT 180 MG 24 hr capsule TAKE 1 CAPSULE BY MOUTH ONCE DAILY**MEDICATION TO CONTROL YOUR HEART RATE** 90 capsule 1  . Cholecalciferol (VITAMIN D3 PO) Take 1 capsule by mouth daily.    . citalopram (CELEXA) 20 MG tablet Take 1 tablet (20 mg total) by mouth daily. 90 tablet 3  . diphenhydrAMINE (BENADRYL) 25 MG tablet Take 25 mg by mouth every 6 (six) hours as needed for itching or allergies.     [name] Kitchen ELIQUIS 5 MG TABS tablet TAKE 1 TABLET BY MOUTH TWICE DAILY (Patient taking differently: Take 5 mg by mouth twice daily) 60 tablet 6  . fexofenadine (ALLEGRA) 180 MG tablet Take 180 mg by mouth daily as needed  for allergies or rhinitis.     [name] Kitchen losartan (COZAAR) 25 MG tablet TAKE 1 TABLET BY MOUTH ONCE DAILY 90 tablet 1  . Multiple Vitamins-Minerals (MULTIVITAMIN WITH MINERALS) tablet Take 1 tablet by mouth daily.      [name] Kitchen EPINEPHrine (EPIPEN 2-PAK) 0.3 mg/0.3 mL IJ SOAJ injection Inject 0.3 mg into the muscle once.      No current facility-administered medications for this visit.     Allergies as of 03/23/2018 - Review Complete 03/23/2018  Allergen Reaction Noted  . Bee venom Anaphylaxis 07/30/2011  . Epinephrine Other (See Comments) 07/28/2011  . Sulfa antibiotics Nausea And  Vomiting 07/28/2011  . Tetanus toxoids Swelling and Other (See Comments) 08/04/2012  . Vancomycin Nausea And Vomiting 07/28/2011  . Adhesive [tape] Rash and Other (See Comments) 02/04/2012  . Influenza vaccine live Swelling and Rash 07/30/2011  . Ivp dye [iodinated diagnostic agents] Rash 07/28/2011  . Zyloprim [allopurinol] Rash 07/28/2011    Family History  Problem Relation Age of Onset  . Hypertension Mother   . Heart disease Mother        MI  . Dementia Mother   . Cancer Father        stomach  . Cancer Brother        multiple myeloma  . Healthy Daughter   . Healthy Daughter   . Healthy Daughter   . Diabetes Neg Hx   . Breast cancer Neg Hx   . Colon cancer Neg Hx     Social History   Socioeconomic History  . Marital status: Widowed    Spouse name: Not on file  . Number of children: 3  . Years of education: Not on file  . Highest education level: Not on file  Occupational History  . Occupation: retired Animal nutritionist  . Financial resource strain: Not on file  . Food insecurity:    Worry: Not on file    Inability: Not on file  . Transportation needs:    Medical: Not on file    Non-medical: Not on file  Tobacco Use  . Smoking status: Never Smoker  . Smokeless tobacco: Never Used  Substance and Sexual Activity  . Alcohol use: Yes    Alcohol/week: 8.4 oz    Types: 14 Glasses of wine per week    Comment: 1 glass of wine daily, occasionally 2  . Drug use: No  . Sexual activity: Not Currently  Lifestyle  . Physical activity:    Days per week: Not on file    Minutes per session: Not on file  . Stress: Not on file  Relationships  . Social connections:    Talks on phone: Not on file    Gets together: Not on file    Attends religious service: Not on file    Active member of club or organization: Not on file    Attends meetings of clubs or organizations: Not on file    Relationship status: Not on file  Other Topics Concern  . Not on file  Social History  Narrative   Lives alone, 2 dogs;  Daughter lives nearby.  3 grandchildren.  Other children in [place].   Widowed (age 89--husband had aortic aneurysm repair, and graft failed/ruptured)--death anniversary date 10/10/23.  He had been a Holocaust survivor.    Review of Systems: Complete ROS negative except as per HPI.   Physical Exam: BP (!) 144/88   Pulse 85   Temp (!) 97 F (36.1 C) (  Oral)   Ht _0  (1.626 m)   Wt 205 lb 9.6 oz (93.3 kg)   BMI 35.29 kg/m  General:   Alert and oriented. Pleasant and cooperative. Well-nourished and well-developed.  Eyes:  Without icterus, sclera clear and conjunctiva pink.  Ears:  Normal auditory acuity. Cardiovascular:  S1, S2 present without murmurs appreciated. Extremities without clubbing or edema. Respiratory:  Clear to auscultation bilaterally. No wheezes, rales, or rhonchi. No distress.  Gastrointestinal:  +BS, soft, non-tender and non-distended. No HSM noted. No guarding or rebound. No masses appreciated.  Rectal:  Deferred  Musculoskalatal:  Symmetrical without gross deformities. Neurologic:  Alert and oriented x4;  grossly normal neurologically. Psych:  Alert and cooperative. Normal mood and affect. Heme/Lymph/Immune: No excessive bruising noted.    03/23/2018 12:14 PM   Disclaimer: This note was dictated with voice recognition software. Similar sounding words can inadvertently be transcribed and may not be corrected upon review.

## 2018-03-23 NOTE — Progress Notes (Signed)
CC'D TO PCP °

## 2018-03-23 NOTE — Patient Instructions (Signed)
1. We will help schedule your next colonoscopy. 2. We will have you do clear liquids for 2 days prior to your procedure, rather than just one day. 3. I am giving you samples of Linzess 72 mcg.  Take 1 pill, once a day, on an empty stomach for 3 days prior to your prep medicine. 4. We will also give you a prep medicine as we did last time. 5. Hold your Eliquis for 2 days prior to your procedure. 6. Return for follow-up based on recommendations made after your colonoscopy. 7. Call us if you have any questions or concerns.  At [facility] we value your feedback. You may receive a survey about your visit today. Please share your experience as we strive to create trusting relationships with our patients to provide genuine, compassionate, quality care.  It was great to see you today!  I hope you have a wonderful summer!!

## 2018-03-23 NOTE — Assessment & Plan Note (Signed)
Currently on Eliquis.  Previously cleared to hold Eliquis for 48 hours prior to procedure.  We will again hold her Eliquis for 48 hours prior to procedure.  Celexa alone is not necessitate augmented sedation.  She did well on conscious sedation last time.  Today she states she had a good sedation experience.  We will proceed with colonoscopy as per below.

## 2018-03-24 ENCOUNTER — Ambulatory Visit (INDEPENDENT_AMBULATORY_CARE_PROVIDER_SITE_OTHER): Payer: Medicare Other | Admitting: *Deleted

## 2018-03-24 DIAGNOSIS — T63441D Toxic effect of venom of bees, accidental (unintentional), subsequent encounter: Secondary | ICD-10-CM | POA: Diagnosis not present

## 2018-05-01 NOTE — Progress Notes (Signed)
Chief Complaint  Patient presents with  . Medicare Wellness    with CPE    Monique James is a 77 y.o. female who presents for annual physical exam, Medicare wellness visit and follow-up on chronic medical conditions.  She denies any specific concerns today.  Anxiety--related to diagnosis of atrial fibrillation. On citalopram and doing very well. Not needing any alprazolam. Denies side effects to citalopram. She weaned off (over 2 months) as a trial a year ago, but after being off it a week, she had increased anxiety regarding her pulse and checking BP's, realized she was more anxious, and restarted it.  Her anxiety is better since being back on the medication. She denies medication side effects.  Atrial fibrillation--diagnosed 11/2014. Last saw cardiology in July, 2018, and no changes were made her her regimen. She continues on Eliquis.She denies chest pain, palpitations, bleeding, bruising. She has follow up scheduled with Dr. Harl Bowie in August.  Hypertension--currently well controlled on Cartiaand Losartan. BP's have been running 125-137/60-78, pulse 77-92.  Hyperlipidemia: Controlled by diet, which remains the same--she has continued to limit her cheese intake, continuing to eat more fruits and vegetables. She cut out the creamy dressings. She mostly eats chicken and some pork, only has a burger on the grill once a month, if even that often. Has tuna without mayonnaise (uses vinegar). Denies any changes to her diet in the last year. Lab Results  Component Value Date   CHOL 217 (H) 04/05/2017   HDL 100 04/05/2017   LDLCALC 103 (H) 04/05/2017   TRIG 71 04/05/2017   CHOLHDL 2.2 04/05/2017    Vitamin D deficiency: Last level was30 in November 2017.She admits to taking MVI and Calcium with D just 2-3 times/week  She now also takes 2000 IU of Vitamin D3 3x/week in addition.  Allergies: Takes Allegra when she goes for her shots, and just seasonally just prn.She continues on  allergy shots every 8 weeks.   She had colonoscopy 12/31/2017 which found a polyp in the cecum (tubular adenoma), diverticulosis, external hemorrhoids.  Recommended repeat colonoscopy in 2 to 3 months due to poor prep, scheduled in 3 weeks.  Patient states that there was no stool, but a fat layer that obstructed the view.  She has a different prep planned, including linzess x 3d and 2d liquid diet.   Immunization History  Administered Date(s) Administered  . Pneumococcal Conjugate-13 10/07/2015   Allergic to tetanus, flu shots. She refuses pneumovax, and zostavax  Last Pap smear: n/a, s/p hysterectomy  Last mammogram: 10/2017 Last colonoscopy: 12/2017, had tubular adenoma; poor prep, scheduled for another colonoscopy later this month Last [facility]: 01/2014 [facility] T-1.3 Ophtho: yearly Dentist: twice yearly  Exercise: aerobics 3x/week ,some with weights (1 hour water aerobics class), plus walking Vitamin D 30 in 09/2016  Other doctors caring for patient include: Dentist: Dr. Truman Hayward Ophtho: Dr. Jorja Loa (at [place]) Allergist: Dr. Shaune Leeks GI: Dr. Barney Drain Cardiologist: Dr. Harl Bowie Dermatologist: Dr. Nevada Crane  Depression screen: negative Fall screen: none in the last year Functional Status screen: negative Mini-cog screen: normal (5) See epic for full questionnaires.  End of Life Discussion: Patient does not havea living will and medical power of attorney-has forms filled out, just needed to get it notarized, but then lost it. She got new forms last year, still hasn't gotten them notarized.   Past Medical History:  Diagnosis Date  . Anxiety   . Atrial fibrillation with rapid ventricular response ([place]) 11/05/2014   New dx  .  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 Shaune Leeks)  . Colon polyps    Dr. Oletta Lamas  . Colon polyps    path was small leiomyoma; tubular adenoma 12/2017  . Constipation   . Gout    history of (1985ish)  . Hemorrhoids   . History of IBS   . Hx  of pelvic mass    complex, benign pathology  . Hyperlipidemia    elevated trigs and LDL  . Hypertension   . Hyponatremia    mild  . Osteopenia    mild (T-1.3 in 01/2014)  . Shingles 02/2015    Past Surgical History:  Procedure Laterality Date  . ABDOMINAL HYSTERECTOMY  1970   for perforated uterus from IUD  . BILATERAL SALPINGOOPHORECTOMY  2005   benign tumor/cyst (Dr. Aldean Ast)  . COLONOSCOPY N/A 12/31/2017   Procedure: COLONOSCOPY;  Surgeon: Danie Binder, MD;  Location: [facility];  Service: Endoscopy;  Laterality: N/A;  10:30am    Social History   Socioeconomic History  . Marital status: Widowed    Spouse name: Not on file  . Number of children: 3  . Years of education: Not on file  . Highest education level: Not on file  Occupational History  . Occupation: retired Animal nutritionist  . Financial resource strain: Not on file  . Food insecurity:    Worry: Not on file    Inability: Not on file  . Transportation needs:    Medical: Not on file    Non-medical: Not on file  Tobacco Use  . Smoking status: Never Smoker  . Smokeless tobacco: Never Used  Substance and Sexual Activity  . Alcohol use: Yes    Alcohol/week: 8.4 oz    Types: 14 Glasses of wine per week    Comment: 1 glass of wine daily, occasionally 2  . Drug use: No  . Sexual activity: Not Currently  Lifestyle  . Physical activity:    Days per week: Not on file    Minutes per session: Not on file  . Stress: Not on file  Relationships  . Social connections:    Talks on phone: Not on file    Gets together: Not on file    Attends religious service: Not on file    Active member of club or organization: Not on file    Attends meetings of clubs or organizations: Not on file    Relationship status: Not on file  . Intimate partner violence:    Fear of current or ex partner: Not on file    Emotionally abused: Not on file    Physically abused: Not on file    Forced sexual activity: Not on file   Other Topics Concern  . Not on file  Social History Narrative   Lives alone, 2 dogs;  Daughter lives nearby.  3 grandchildren.  Other children in [place].   Widowed (age 57--husband had aortic aneurysm repair, and graft failed/ruptured)--death anniversary date 2023-10-22.  He had been a Holocaust survivor.    Family History  Problem Relation Age of Onset  . Hypertension Mother   . Heart disease Mother        MI  . Dementia Mother   . Cancer Father        stomach  . Cancer Brother        multiple myeloma  . Healthy Daughter   . Healthy Daughter   . Healthy Daughter   . Diabetes  Neg Hx   . Breast cancer Neg Hx   . Colon cancer Neg Hx     Outpatient Encounter Medications as of 05/02/2018  Medication Sig Note  . acetaminophen (TYLENOL) 325 MG tablet Take 650 mg by mouth every 6 (six) hours as needed for moderate pain or headache.   [name] Kitchen CARTIA XT 180 MG 24 hr capsule TAKE 1 CAPSULE BY MOUTH ONCE DAILY**MEDICATION TO CONTROL YOUR HEART RATE**   . Cholecalciferol (VITAMIN D3 PO) Take 1 capsule by mouth daily. 05/02/2018: 2000 IU about 3x/week  . citalopram (CELEXA) 20 MG tablet Take 1 tablet (20 mg total) by mouth daily.   . diphenhydrAMINE (BENADRYL) 25 MG tablet Take 25 mg by mouth every 6 (six) hours as needed for itching or allergies.    [name] Kitchen ELIQUIS 5 MG TABS tablet TAKE 1 TABLET BY MOUTH TWICE DAILY (Patient taking differently: Take 5 mg by mouth twice daily)   . EPINEPHrine (EPIPEN 2-PAK) 0.3 mg/0.3 mL IJ SOAJ injection Inject 0.3 mg into the muscle once.    . fexofenadine (ALLEGRA) 180 MG tablet Take 180 mg by mouth daily as needed for allergies or rhinitis.    [name] Kitchen losartan (COZAAR) 25 MG tablet TAKE 1 TABLET BY MOUTH ONCE DAILY   . Multiple Vitamins-Minerals (MULTIVITAMIN WITH MINERALS) tablet Take 1 tablet by mouth daily.     . Na Sulfate-K Sulfate-Mg Sulf 17.5-3.13-1.6 GM/177ML SOLN Take 1 kit by mouth as directed.    No facility-administered encounter medications on file  as of 05/02/2018.     Allergies  Allergen Reactions  . Bee Venom Anaphylaxis  . Epinephrine Other (See Comments)    High pulse rate.  . Sulfa Antibiotics Nausea And Vomiting  . Tetanus Toxoids Swelling and Other (See Comments)    Entire arm swelled  . Vancomycin Nausea And Vomiting  . Adhesive [Tape] Rash and Other (See Comments)    Her skin peels off.  . Influenza Vaccine Live Swelling and Rash  . Ivp Dye [Iodinated Diagnostic Agents] Rash  . Zyloprim [Allopurinol] Rash    ROS: The patient denies anorexia, fever, headaches, vision changes, decreased hearing, ear pain, sore throat, breast concerns, chest pain, palpitations, dizziness, syncope, dyspnea on exertion, cough, swelling, nausea, vomiting, diarrhea,abdominal pain, melena, hematochezia, hematuria, incontinence, dysuria, vaginal bleeding, discharge, odor or itch, genital lesions, numbness, tingling, weakness, tremor, suspicious skin lesions, abnormal bleeding/bruising, or enlarged lymph nodes. 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 Just slightly achey Rare heartburn with fatty foods (rare, relieved by Tums).  Some mild constipation, controlled with high fiber diet and stool softeners prn. Anxiety is well controlled, denies depression.   PHYSICAL EXAM:  BP (!) 160/82   Pulse 87   Temp 98.6 F (37 C) (Oral)   Ht 5' 4" (1.626 m)   Wt 207 lb (93.9 kg)   SpO2 96%   BMI 35.53 kg/m   140/84 on repeat by MD  Wt Readings from Last 3 Encounters:  05/02/18 207 lb (93.9 kg)  03/23/18 205 lb 9.6 oz (93.3 kg)  12/31/17 202 lb (91.6 kg)    General Appearance:  Alert, cooperative, no distress, appears stated age. Somewhat excitable  Head:  Normocephalic, without obvious abnormality, atraumatic   Eyes:  PERRL, conjunctiva and  sclera are clear. EOM's intact, fundi benign   Ears:  Normal TM's and external ear canals   Nose:  Nares normal, mucosa normal, no drainage or sinus tenderness   Throat:  Lips, mucosa, and tongue normal; teeth and gums normal   Neck:  Supple, no lymphadenopathy; thyroid: no enlargement/ tenderness/nodules; no carotid bruit or JVD   Back:  Spine nontender, no curvature, ROM normal, no CVA tenderness   Lungs:  Clear to auscultation bilaterally without wheezes, rales or ronchi; respirations unlabored   Chest Wall:  No tenderness or deformity   Heart:  Regular rate and rhythm, S1 and S2 normal, no murmur, rub or gallop. One skipped beat noted, otherwise regular  Breast Exam:  No tenderness, masses, or nipple discharge or inversion. No axillary lymphadenopathy   Abdomen:  Soft, non-tender, obese, nondistended, normoactive bowel sounds, no masses, no hepatosplenomegaly   Genitalia:  Normal external genitalia without lesions, mild atrophic changes. BUS and vagina normal; No abnormal vaginal discharge. Bimanual exam was normal, without any masses appreciable, nontender   Rectal:  Not performed today, deferred to GI  Extremities:  No clubbing, cyanosis or edema. Some bony abnormalities c/w arthritis (especially R ring finger), slight deviation, and also small area of ST swelling/mass that is nontender, mobile at radial aspect on dorsum of right 4th proximal phalanx. Stable from last year  Pulses:  2+ and symmetric all extremities   Skin:  Skin color, texture, turgor normal. No rashes or lesions.  Lymph nodes:  Cervical, supraclavicular, and axillary nodes normal   Neurologic:  CNII-XII intact, normal strength, sensation and gait; reflexes 2+ and symmetric throughout   Psych:  Normal mood, affect, hygiene and grooming   ASSESSMENT/PLAN:  Annual physical exam - Plan: Comprehensive metabolic panel, Lipid panel, CBC with  Differential/Platelet, VITAMIN D 25 Hydroxy (Vit-D Deficiency, Fractures), POCT Urinalysis DIP (Proadvantage Device)  Medicare annual wellness visit, subsequent  Essential hypertension - elevated today (somewhat excitable); better at home. Cont current regimen and f/u with cardiologist as planned - Plan: Comprehensive metabolic panel  Atrial fibrillation with rapid ventricular response (HCC) - rate controlled, NSR, on anticoagulant without complication  Vitamin D deficiency - Plan: VITAMIN D 25 Hydroxy (Vit-D Deficiency, Fractures)  Pure hypercholesterolemia - with high HDL. Reviewed low cholesterol diet - Plan: Lipid panel  Generalized anxiety disorder - controlled, continue citalopram  Encounter for current long-term use of anticoagulants  Medication monitoring encounter - Plan: Comprehensive metabolic panel, CBC with Differential/Platelet  c-met, cbc, Lipids, vitamin D   Discussed monthly self breast exams and yearly mammograms; at least 30 minutes of aerobic activity at least 5 days/week, weight-bearing exercise 2-3x/wk; proper sunscreen use reviewed; healthy diet, including goals of calcium and vitamin D intake and alcohol recommendations (less than or equal to 1 drink/day) reviewed; regular seatbelt use; changing batteries in smoke detectors. Immunization recommendations discussed--allergic to flu shots. Shingrix recommended, as is pneumovax. She is hesitant--she did talk about it with her allergist, who told her they don't give those vaccines in his office. I highly encouraged her to re-consider getting pneumovax.  She had shingles a few years ago--Shingrix is recommended, but more important that she get pneumovax.  She will consider. Colonoscopy recommendations reviewed, scheduled for later this month. DEXA next year   New forms given for living will and healthcare power of attorney  MOST form updated and reviewed.  Full Code, full care.  To f/u in 6 months--counseled a lot  regarding diet, exercise, risks of obesity.  6 month visit to f/u on weight, as well as blood pressure.  Medicare Attestation I have personally reviewed: The patient's medical and social history Their use of alcohol, tobacco or illicit drugs Their current medications and supplements The patient's functional ability including ADLs,fall risks, home safety risks, cognitive, and hearing and visual impairment Diet and physical activities Evidence for depression or mood disorders  The patient's weight, height and BMI have been recorded in the chart.  I have made referrals, counseling, and provided education to the patient based on review of the above and I have provided the patient with a written personalized care plan for preventive services.

## 2018-05-02 ENCOUNTER — Encounter: Payer: Self-pay | Admitting: Family Medicine

## 2018-05-02 ENCOUNTER — Ambulatory Visit (INDEPENDENT_AMBULATORY_CARE_PROVIDER_SITE_OTHER): Payer: Medicare Other | Admitting: Family Medicine

## 2018-05-02 VITALS — BP 140/84 | HR 87 | Temp 98.6°F | Ht 64.0 in | Wt 207.0 lb

## 2018-05-02 DIAGNOSIS — E7800 Pure hypercholesterolemia, unspecified: Secondary | ICD-10-CM | POA: Diagnosis not present

## 2018-05-02 DIAGNOSIS — I10 Essential (primary) hypertension: Secondary | ICD-10-CM

## 2018-05-02 DIAGNOSIS — Z5181 Encounter for therapeutic drug level monitoring: Secondary | ICD-10-CM | POA: Diagnosis not present

## 2018-05-02 DIAGNOSIS — Z0000 Encounter for general adult medical examination without abnormal findings: Secondary | ICD-10-CM | POA: Diagnosis not present

## 2018-05-02 DIAGNOSIS — F411 Generalized anxiety disorder: Secondary | ICD-10-CM

## 2018-05-02 DIAGNOSIS — I4891 Unspecified atrial fibrillation: Secondary | ICD-10-CM

## 2018-05-02 DIAGNOSIS — E559 Vitamin D deficiency, unspecified: Secondary | ICD-10-CM

## 2018-05-02 DIAGNOSIS — Z7901 Long term (current) use of anticoagulants: Secondary | ICD-10-CM

## 2018-05-02 LAB — POCT URINALYSIS DIP (PROADVANTAGE DEVICE)
Bilirubin, UA: NEGATIVE
GLUCOSE UA: NEGATIVE mg/dL
Ketones, POC UA: NEGATIVE mg/dL
Leukocytes, UA: NEGATIVE
Nitrite, UA: NEGATIVE
pH, UA: 6 (ref 5.0–8.0)

## 2018-05-03 ENCOUNTER — Encounter: Payer: Self-pay | Admitting: Family Medicine

## 2018-05-03 LAB — LIPID PANEL
CHOLESTEROL TOTAL: 237 mg/dL — AB (ref 100–199)
Chol/HDL Ratio: 2.3 ratio (ref 0.0–4.4)
HDL: 103 mg/dL (ref >39)
LDL Calculated: 121 mg/dL — ABNORMAL HIGH (ref 0–99)
TRIGLYCERIDES: 65 mg/dL (ref 0–149)
VLDL CHOLESTEROL CAL: 13 mg/dL (ref 5–40)

## 2018-05-03 LAB — COMPREHENSIVE METABOLIC PANEL
ALK PHOS: 82 IU/L (ref 39–117)
ALT: 8 IU/L (ref 0–32)
AST: 16 IU/L (ref 0–40)
Albumin/Globulin Ratio: 1.6 (ref 1.2–2.2)
Albumin: 4.5 g/dL (ref 3.5–4.8)
BILIRUBIN TOTAL: 0.6 mg/dL (ref 0.0–1.2)
BUN/Creatinine Ratio: 16 (ref 12–28)
BUN: 14 mg/dL (ref 8–27)
CHLORIDE: 96 mmol/L (ref 96–106)
CO2: 25 mmol/L (ref 20–29)
Calcium: 9.8 mg/dL (ref 8.7–10.3)
Creatinine, Ser: 0.87 mg/dL (ref 0.57–1.00)
GFR calc Af Amer: 74 mL/min/{1.73_m2} (ref >59)
GFR calc non Af Amer: 64 mL/min/{1.73_m2} (ref >59)
GLUCOSE: 96 mg/dL (ref 65–99)
Globulin, Total: 2.8 g/dL (ref 1.5–4.5)
Potassium: 4.5 mmol/L (ref 3.5–5.2)
Sodium: 134 mmol/L (ref 134–144)
Total Protein: 7.3 g/dL (ref 6.0–8.5)

## 2018-05-03 LAB — CBC WITH DIFFERENTIAL/PLATELET
BASOS ABS: 0 10*3/uL (ref 0.0–0.2)
Basos: 0 %
EOS (ABSOLUTE): 0.1 10*3/uL (ref 0.0–0.4)
EOS: 1 %
HEMOGLOBIN: 14.5 g/dL (ref 11.1–15.9)
Hematocrit: 44.9 % (ref 34.0–46.6)
IMMATURE GRANS (ABS): 0 10*3/uL (ref 0.0–0.1)
IMMATURE GRANULOCYTES: 0 %
Lymphocytes Absolute: 3.1 10*3/uL (ref 0.7–3.1)
Lymphs: 31 %
MCH: 31.7 pg (ref 26.6–33.0)
MCHC: 32.3 g/dL (ref 31.5–35.7)
MCV: 98 fL — ABNORMAL HIGH (ref 79–97)
Monocytes Absolute: 0.9 10*3/uL (ref 0.1–0.9)
Monocytes: 9 %
NEUTROS ABS: 6 10*3/uL (ref 1.4–7.0)
NEUTROS PCT: 59 %
PLATELETS: 343 10*3/uL (ref 150–450)
RBC: 4.57 x10E6/uL (ref 3.77–5.28)
RDW: 13.9 % (ref 12.3–15.4)
WBC: 10.1 10*3/uL (ref 3.4–10.8)

## 2018-05-03 LAB — VITAMIN D 25 HYDROXY (VIT D DEFICIENCY, FRACTURES): VIT D 25 HYDROXY: 26.3 ng/mL — AB (ref 30.0–100.0)

## 2018-05-19 ENCOUNTER — Ambulatory Visit (INDEPENDENT_AMBULATORY_CARE_PROVIDER_SITE_OTHER): Payer: Medicare Other | Admitting: *Deleted

## 2018-05-19 DIAGNOSIS — T63441D Toxic effect of venom of bees, accidental (unintentional), subsequent encounter: Secondary | ICD-10-CM | POA: Diagnosis not present

## 2018-05-23 ENCOUNTER — Encounter (HOSPITAL_COMMUNITY)
Admission: RE | Disposition: A | Discharge status: Home / Self Care | Payer: Self-pay | Source: Ambulatory Visit | Attending: Gastroenterology

## 2018-05-23 ENCOUNTER — Other Ambulatory Visit: Payer: Self-pay

## 2018-05-23 ENCOUNTER — Encounter (HOSPITAL_COMMUNITY): Payer: Self-pay | Admitting: *Deleted

## 2018-05-23 ENCOUNTER — Ambulatory Visit (HOSPITAL_COMMUNITY)
Admission: RE | Admit: 2018-05-23 | Discharge: 2018-05-23 | Disposition: A | Discharge status: Home / Self Care | Payer: Medicare Other | Source: Ambulatory Visit | Attending: Gastroenterology | Admitting: Gastroenterology

## 2018-05-23 DIAGNOSIS — M8580 Other specified disorders of bone density and structure, unspecified site: Secondary | ICD-10-CM | POA: Insufficient documentation

## 2018-05-23 DIAGNOSIS — Z91030 Bee allergy status: Secondary | ICD-10-CM | POA: Insufficient documentation

## 2018-05-23 DIAGNOSIS — D123 Benign neoplasm of transverse colon: Secondary | ICD-10-CM | POA: Insufficient documentation

## 2018-05-23 DIAGNOSIS — D122 Benign neoplasm of ascending colon: Secondary | ICD-10-CM | POA: Insufficient documentation

## 2018-05-23 DIAGNOSIS — Z882 Allergy status to sulfonamides status: Secondary | ICD-10-CM | POA: Insufficient documentation

## 2018-05-23 DIAGNOSIS — Z86010 Personal history of colonic polyps: Secondary | ICD-10-CM | POA: Insufficient documentation

## 2018-05-23 DIAGNOSIS — Z91041 Radiographic dye allergy status: Secondary | ICD-10-CM | POA: Diagnosis not present

## 2018-05-23 DIAGNOSIS — D124 Benign neoplasm of descending colon: Secondary | ICD-10-CM | POA: Diagnosis not present

## 2018-05-23 DIAGNOSIS — D125 Benign neoplasm of sigmoid colon: Secondary | ICD-10-CM | POA: Insufficient documentation

## 2018-05-23 DIAGNOSIS — K5730 Diverticulosis of large intestine without perforation or abscess without bleeding: Secondary | ICD-10-CM | POA: Diagnosis not present

## 2018-05-23 DIAGNOSIS — E785 Hyperlipidemia, unspecified: Secondary | ICD-10-CM | POA: Insufficient documentation

## 2018-05-23 DIAGNOSIS — Z8249 Family history of ischemic heart disease and other diseases of the circulatory system: Secondary | ICD-10-CM | POA: Diagnosis not present

## 2018-05-23 DIAGNOSIS — Z887 Allergy status to serum and vaccine status: Secondary | ICD-10-CM | POA: Diagnosis not present

## 2018-05-23 DIAGNOSIS — Z79899 Other long term (current) drug therapy: Secondary | ICD-10-CM | POA: Insufficient documentation

## 2018-05-23 DIAGNOSIS — I4891 Unspecified atrial fibrillation: Secondary | ICD-10-CM | POA: Diagnosis not present

## 2018-05-23 DIAGNOSIS — Z8619 Personal history of other infectious and parasitic diseases: Secondary | ICD-10-CM | POA: Diagnosis not present

## 2018-05-23 DIAGNOSIS — F419 Anxiety disorder, unspecified: Secondary | ICD-10-CM | POA: Diagnosis not present

## 2018-05-23 DIAGNOSIS — I10 Essential (primary) hypertension: Secondary | ICD-10-CM | POA: Insufficient documentation

## 2018-05-23 DIAGNOSIS — K644 Residual hemorrhoidal skin tags: Secondary | ICD-10-CM | POA: Diagnosis not present

## 2018-05-23 DIAGNOSIS — Z888 Allergy status to other drugs, medicaments and biological substances status: Secondary | ICD-10-CM | POA: Diagnosis not present

## 2018-05-23 DIAGNOSIS — Z1211 Encounter for screening for malignant neoplasm of colon: Secondary | ICD-10-CM | POA: Diagnosis not present

## 2018-05-23 DIAGNOSIS — Z7901 Long term (current) use of anticoagulants: Secondary | ICD-10-CM | POA: Diagnosis not present

## 2018-05-23 DIAGNOSIS — Z881 Allergy status to other antibiotic agents status: Secondary | ICD-10-CM | POA: Insufficient documentation

## 2018-05-23 HISTORY — PX: POLYPECTOMY: SHX5525

## 2018-05-23 HISTORY — PX: COLONOSCOPY: SHX5424

## 2018-05-23 SURGERY — COLONOSCOPY
Anesthesia: Moderate Sedation

## 2018-05-23 MED ORDER — MIDAZOLAM HCL 5 MG/5ML IJ SOLN
INTRAMUSCULAR | Status: DC | PRN
  Administered 2018-05-23 (×3): 1 mg via INTRAVENOUS
  Administered 2018-05-23: 2 mg via INTRAVENOUS
  Administered 2018-05-23: 1 mg via INTRAVENOUS

## 2018-05-23 MED ORDER — STERILE WATER FOR IRRIGATION IR SOLN
Status: DC | PRN
  Administered 2018-05-23: 1 mL

## 2018-05-23 MED ORDER — MEPERIDINE HCL 100 MG/ML IJ SOLN
INTRAMUSCULAR | Status: AC
  Filled 2018-05-23: qty 2

## 2018-05-23 MED ORDER — MIDAZOLAM HCL 5 MG/5ML IJ SOLN
INTRAMUSCULAR | Status: DC
Start: 2018-05-23 — End: 2018-05-23
  Filled 2018-05-23: qty 5

## 2018-05-23 MED ORDER — MIDAZOLAM HCL 5 MG/5ML IJ SOLN
INTRAMUSCULAR | Status: AC
  Filled 2018-05-23: qty 10

## 2018-05-23 MED ORDER — MEPERIDINE HCL 100 MG/ML IJ SOLN
INTRAMUSCULAR | Status: DC | PRN
  Administered 2018-05-23 (×2): 25 mg via INTRAVENOUS
  Administered 2018-05-23: 50 mg via INTRAVENOUS

## 2018-05-23 MED ORDER — SODIUM CHLORIDE 0.9 % IV SOLN
INTRAVENOUS | Status: DC
  Administered 2018-05-23: 08:00:00 via INTRAVENOUS

## 2018-05-23 NOTE — Discharge Instructions (Signed)
You have MODERATE EXTERNALhemorrhoids and diverticulosis IN YOUR LEFT AND RIGHT COLON. YOU HAD TEN POLYPS REMOVED. I PLACED A CLIP IN THE RIGHT COLON TO PREVENT BLEEDING IN 7-10 DAYS.      HOLD ELIQUIS.  RE-START JUL 23.  IF YOU NEED AN MRI, LET THE RADIOLOGY TECH KNOW THAT YOU HAD ONE CLIP PLACED IN YOUR COLON. THEY SHOULD FALL OFF IN 30 DAYS.   DRINK WATER TO KEEP YOUR URINE LIGHT YELLOW.  CONTINUE YOUR WEIGHT LOSS EFFORTS. YOUR BODY MASS INDEX IS OVER 30 WHICH MEANS YOU ARE OBESE. OBESITY IS ASSOCIATED WITH AN INCREASE FOR ALL CANCERS, INCLUDING ESOPHAGEAL AND COLON POLYPS/CANCER.A WEIGHT OF 173 LBS OR LESS  WILL GET YOUR BODY MASS INDEX(BMI) UNDER 30.  FOLLOW A HIGH FIBER DIET. AVOID ITEMS THAT CAUSE BLOATING. See info below.   YOUR BIOPSY RESULTS WILL BE AVAILABLE IN 7 DAYS WITH YOUR RESULTS.   USE PREPARATION H FOUR TIMES  A DAY IF NEEDED TO RELIEVE RECTAL PAIN/PRESSURE/BLEEDING.   Next colonoscopy in 3 YEARS ONLY IF ADVANCED CHANGES ARE SEEN IN POLYPS. YOUR SISTERS, BROTHERS, CHILDREN, AND PARENTS NEED TO HAVE A COLONOSCOPY STARTING AT THE AGE OF 40.    Colonoscopy Care After Read the instructions outlined below and refer to this sheet in the next week. These discharge instructions provide you with general information on caring for yourself after you leave the hospital. While your treatment has been planned according to the most current medical practices available, unavoidable complications occasionally occur. If you have any problems or questions after discharge, call DR. Chatham Howington, [phone].  ACTIVITY  You may resume your regular activity, but move at a slower pace for the next 24 hours.   Take frequent rest periods for the next 24 hours.   Walking will help get rid of the air and reduce the bloated feeling in your belly (abdomen).   No driving for 24 hours (because of the medicine (anesthesia) used during the test).   You may shower.   Do not sign any important  legal documents or operate any machinery for 24 hours (because of the anesthesia used during the test).    NUTRITION  Drink plenty of fluids.   You may resume your normal diet as instructed by your doctor.   Begin with a light meal and progress to your normal diet. Heavy or fried foods are harder to digest and may make you feel sick to your stomach (nauseated).   Avoid alcoholic beverages for 24 hours or as instructed.    MEDICATIONS  You may resume your normal medications.   WHAT YOU CAN EXPECT TODAY  Some feelings of bloating in the abdomen.   Passage of more gas than usual.   Spotting of blood in your stool or on the toilet paper  .  IF YOU HAD POLYPS REMOVED DURING THE COLONOSCOPY:  Eat a soft diet IF YOU HAVE NAUSEA, BLOATING, ABDOMINAL PAIN, OR VOMITING.    FINDING OUT THE RESULTS OF YOUR TEST Not all test results are available during your visit. DR. Oneida Alar WILL CALL YOU WITHIN 14 DAYS OF YOUR PROCEDUE WITH YOUR RESULTS. Do not assume everything is normal if you have not heard from DR. Emalea Mix, CALL HER OFFICE AT [phone].  SEEK IMMEDIATE MEDICAL ATTENTION AND CALL THE OFFICE: [phone] IF:  You have more than a spotting of blood in your stool.   Your belly is swollen (abdominal distention).   You are nauseated or vomiting.   You have a temperature over 101F.  You have abdominal pain or discomfort that is severe or gets worse throughout the day.  High-Fiber Diet A high-fiber diet changes your normal diet to include more whole grains, legumes, fruits, and vegetables. Changes in the diet involve replacing refined carbohydrates with unrefined foods. The calorie level of the diet is essentially unchanged. The Dietary Reference Intake (recommended amount) for adult males is 38 grams per day. For adult females, it is 25 grams per day. Pregnant and lactating women should consume 28 grams of fiber per day. Fiber is the intact part of a plant that is not broken  down during digestion. Functional fiber is fiber that has been isolated from the plant to provide a beneficial effect in the body. PURPOSE  Increase stool bulk.   Ease and regulate bowel movements.   Lower cholesterol.   REDUCE RISK OF COLON CANCER  INDICATIONS THAT YOU NEED MORE FIBER  Constipation and hemorrhoids.   Uncomplicated diverticulosis (intestine condition) and irritable bowel syndrome.   Weight management.   As a protective measure against hardening of the arteries (atherosclerosis), diabetes, and cancer.   GUIDELINES FOR INCREASING FIBER IN THE DIET  Start adding fiber to the diet slowly. A gradual increase of about 5 more grams (2 slices of whole-wheat bread, 2 servings of most fruits or vegetables, or 1 bowl of high-fiber cereal) per day is best. Too rapid an increase in fiber may result in constipation, flatulence, and bloating.   Drink enough water and fluids to keep your urine clear or pale yellow. Water, juice, or caffeine-free drinks are recommended. Not drinking enough fluid may cause constipation.   Eat a variety of high-fiber foods rather than one type of fiber.   Try to increase your intake of fiber through using high-fiber foods rather than fiber pills or supplements that contain small amounts of fiber.   The goal is to change the types of food eaten. Do not supplement your present diet with high-fiber foods, but replace foods in your present diet.   INCLUDE A VARIETY OF FIBER SOURCES  Replace refined and processed grains with whole grains, canned fruits with fresh fruits, and incorporate other fiber sources. White rice, white breads, and most bakery goods contain little or no fiber.   Brown whole-grain rice, buckwheat oats, and many fruits and vegetables are all good sources of fiber. These include: broccoli, Brussels sprouts, cabbage, cauliflower, beets, sweet potatoes, white potatoes (skin on), carrots, tomatoes, eggplant, squash, berries, fresh  fruits, and dried fruits.   Cereals appear to be the richest source of fiber. Cereal fiber is found in whole grains and bran. Bran is the fiber-rich outer coat of cereal grain, which is largely removed in refining. In whole-grain cereals, the bran remains. In breakfast cereals, the largest amount of fiber is found in those with "bran" in their names. The fiber content is sometimes indicated on the label.   You may need to include additional fruits and vegetables each day.   In baking, for 1 cup white flour, you may use the following substitutions:   1 cup whole-wheat flour minus 2 tablespoons.   1/2 cup white flour plus 1/2 cup whole-wheat flour.   Polyps, Colon  A polyp is extra tissue that grows inside your body. Colon polyps grow in the large intestine. The large intestine, also called the colon, is part of your digestive system. It is a long, hollow tube at the end of your digestive tract where your body makes and stores stool. Most polyps are not  dangerous. They are benign. This means they are not cancerous. But over time, some types of polyps can turn into cancer. Polyps that are smaller than a pea are usually not harmful. But larger polyps could someday become or may already be cancerous. To be safe, doctors remove all polyps and test them.   PREVENTION There is not one sure way to prevent polyps. You might be able to lower your risk of getting them if you:  Eat more fruits and vegetables and less fatty food.   Do not smoke.   Avoid alcohol.   Exercise every day.   Lose weight if you are overweight.   Eating more calcium and folate can also lower your risk of getting polyps. Some foods that are rich in calcium are milk, cheese, and broccoli. Some foods that are rich in folate are chickpeas, kidney beans, and spinach.    Diverticulosis Diverticulosis is a common condition that develops when small pouches (diverticula) form in the wall of the colon. The risk of diverticulosis  increases with age. It happens more often in people who eat a low-fiber diet. Most individuals with diverticulosis have no symptoms. Those individuals with symptoms usually experience belly (abdominal) pain, constipation, or loose stools (diarrhea).  HOME CARE INSTRUCTIONS  Increase the amount of fiber in your diet as directed by your caregiver or dietician. This may reduce symptoms of diverticulosis.   Drink at least 6 to 8 glasses of water each day to prevent constipation.   Try not to strain when you have a bowel movement.   Avoiding nuts and seeds to prevent complications is NOT NECESSARY.   FOODS HAVING HIGH FIBER CONTENT INCLUDE:  Fruits. Apple, peach, pear, tangerine, raisins, prunes.   Vegetables. Brussels sprouts, asparagus, broccoli, cabbage, carrot, cauliflower, romaine lettuce, spinach, summer squash, tomato, winter squash, zucchini.   Starchy Vegetables. Baked beans, kidney beans, lima beans, split peas, lentils, potatoes (with skin).   Grains. Whole wheat bread, brown rice, bran flake cereal, plain oatmeal, white rice, shredded wheat, bran muffins.   SEEK IMMEDIATE MEDICAL CARE IF:  You develop increasing pain or severe bloating.   You have an oral temperature above 101F.   You develop vomiting or bowel movements that are bloody or black.   Hemorrhoids Hemorrhoids are dilated (enlarged) veins around the rectum. Sometimes clots will form in the veins. This makes them swollen and painful. These are called thrombosed hemorrhoids. Causes of hemorrhoids include:  Constipation.   Straining to have a bowel movement.   HEAVY LIFTING   HOME CARE INSTRUCTIONS  Eat a well balanced diet and drink 6 to 8 glasses of water every day to avoid constipation. You may also use a bulk laxative.   Avoid straining to have bowel movements.   Keep anal area dry and clean.   Do not use a donut shaped pillow or sit on the toilet for long periods. This increases blood pooling and  pain.   Move your bowels when your body has the urge; this will require less straining and will decrease pain and pressure.

## 2018-05-23 NOTE — H&P (Signed)
Primary Care Physician:  Rita Ohara, MD Primary Gastroenterologist:  Dr. Oneida Alar  Pre-Procedure History & Physical: HPI:  Monique James is a 77 y.o. female here for  PERSONAL HISTORY OF POLYPS.  Past Medical History:  Diagnosis Date  . Anxiety   . Atrial fibrillation with rapid ventricular response ([facility]) 11/05/2014   New dx  . 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 Shaune Leeks)  . Colon polyps    Dr. Oletta Lamas  . Colon polyps    path was small leiomyoma; tubular adenoma 12/2017  . Constipation   . Gout    history of (1985ish)  . Hemorrhoids   . History of IBS   . Hx of pelvic mass    complex, benign pathology  . Hyperlipidemia    elevated trigs and LDL  . Hypertension   . Hyponatremia    mild  . Osteopenia    mild (T-1.3 in 01/2014)  . Shingles 02/2015    Past Surgical History:  Procedure Laterality Date  . ABDOMINAL HYSTERECTOMY  1970   for perforated uterus from IUD  . BILATERAL SALPINGOOPHORECTOMY  2005   benign tumor/cyst (Dr. Aldean Ast)  . COLONOSCOPY N/A 12/31/2017   Procedure: COLONOSCOPY;  Surgeon: Danie Binder, MD;  Location: [facility];  Service: Endoscopy;  Laterality: N/A;  10:30am    Prior to Admission medications   Medication Sig Start Date End Date Taking? Authorizing Provider  acetaminophen (TYLENOL) 325 MG tablet Take 650 mg by mouth every 6 (six) hours as needed for moderate pain or headache.   Yes [provider]  CARTIA XT 180 MG 24 hr capsule TAKE 1 CAPSULE BY MOUTH ONCE DAILY**MEDICATION TO CONTROL YOUR HEART RATE** 02/14/18  Yes Branch, Alphonse Guild, MD  Cholecalciferol (VITAMIN D3) 2000 units TABS Take 2,000 Units by mouth daily.    Yes [provider]  citalopram (CELEXA) 20 MG tablet Take 1 tablet (20 mg total) by mouth daily. 10/14/17  Yes Rita Ohara, MD  diphenhydrAMINE (BENADRYL) 25 MG tablet Take 25 mg by mouth every 6 (six) hours as needed for itching or allergies (ALLERGY VACCINE).    Yes [provider]   ELIQUIS 5 MG TABS tablet TAKE 1 TABLET BY MOUTH TWICE DAILY 12/01/17  Yes Branch, Alphonse Guild, MD  fexofenadine (ALLEGRA) 180 MG tablet Take 180 mg by mouth daily as needed for allergies (ALLERGY VACINE).    Yes [provider]  losartan (COZAAR) 25 MG tablet TAKE 1 TABLET BY MOUTH ONCE DAILY 01/12/18  Yes Branch, Alphonse Guild, MD  Multiple Vitamins-Minerals (MULTIVITAMIN WITH MINERALS) tablet Take 1 tablet by mouth daily.     Yes [provider]  Na Sulfate-K Sulfate-Mg Sulf 17.5-3.13-1.6 GM/177ML SOLN Take 1 kit by mouth as directed. 03/23/18  Yes Fields, Sandi L, MD  EPINEPHrine (EPIPEN 2-PAK) 0.3 mg/0.3 mL IJ SOAJ injection Inject 0.3 mg into the muscle once.     [provider]    Allergies as of 03/23/2018 - Review Complete 03/23/2018  Allergen Reaction Noted  . Bee venom Anaphylaxis 07/30/2011  . Epinephrine Other (See Comments) 07/28/2011  . Sulfa antibiotics Nausea And Vomiting 07/28/2011  . Tetanus toxoids Swelling and Other (See Comments) 08/04/2012  . Vancomycin Nausea And Vomiting 07/28/2011  . Adhesive [tape] Rash and Other (See Comments) 02/04/2012  . Influenza vaccine live Swelling and Rash 07/30/2011  . Ivp dye [iodinated diagnostic agents] Rash 07/28/2011  . Zyloprim [allopurinol] Rash 07/28/2011    Family History  Problem Relation  Age of Onset  . Hypertension Mother   . Heart disease Mother        MI  . Dementia Mother   . Cancer Father        stomach  . Cancer Brother        multiple myeloma  . Healthy Daughter   . Healthy Daughter   . Healthy Daughter   . Diabetes Neg Hx   . Breast cancer Neg Hx   . Colon cancer Neg Hx     Social History   Socioeconomic History  . Marital status: Widowed    Spouse name: Not on file  . Number of children: 3  . Years of education: Not on file  . Highest education level: Not on file  Occupational History  . Occupation: retired Animal nutritionist  . Financial resource strain: Not on file  . Food  insecurity:    Worry: Not on file    Inability: Not on file  . Transportation needs:    Medical: Not on file    Non-medical: Not on file  Tobacco Use  . Smoking status: Never Smoker  . Smokeless tobacco: Never Used  Substance and Sexual Activity  . Alcohol use: Yes    Alcohol/week: 8.4 oz    Types: 14 Glasses of wine per week    Comment: 1 glass of wine daily, occasionally 2  . Drug use: No  . Sexual activity: Not Currently  Lifestyle  . Physical activity:    Days per week: Not on file    Minutes per session: Not on file  . Stress: Not on file  Relationships  . Social connections:    Talks on phone: Not on file    Gets together: Not on file    Attends religious service: Not on file    Active member of club or organization: Not on file    Attends meetings of clubs or organizations: Not on file    Relationship status: Not on file  . Intimate partner violence:    Fear of current or ex partner: Not on file    Emotionally abused: Not on file    Physically abused: Not on file    Forced sexual activity: Not on file  Other Topics Concern  . Not on file  Social History Narrative   Lives alone, 2 dogs;  Daughter lives nearby.  3 grandchildren.  Other children in [place].   Widowed (age 4--husband had aortic aneurysm repair, and graft failed/ruptured)--death anniversary date 10/09/23.  He had been a Holocaust survivor.    Review of Systems: See HPI, otherwise negative ROS   Physical Exam: BP (!) 143/71   Pulse 70   Temp 97.9 F (36.6 C) (Oral)   Resp (!) 24   SpO2 99%  General:   Alert,  pleasant and cooperative in NAD Head:  Normocephalic and atraumatic. Neck:  Supple; Lungs:  Clear throughout to auscultation.    Heart:  Regular rate and rhythm. Abdomen:  Soft, nontender and nondistended. Normal bowel sounds, without guarding, and without rebound.   Neurologic:  Alert and  oriented x4;  grossly normal neurologically.  Impression/Plan:     PERSONAL  HISTORY OF POLYPS.  PLAN: 1. TCS TODAY. DISCUSSED PROCEDURE, BENEFITS, & RISKS: < 1% chance of medication reaction, bleeding, perforation, or rupture of spleen/liver.

## 2018-05-23 NOTE — Op Note (Addendum)
[facility] Patient Name: Monique James Procedure Date: 05/23/2018 8:16 AM MRN: [id number] Date of Birth: August 26, 1941 Attending MD: Barney Drain MD, MD CSN: [id number] Age: 77 Admit Type: Outpatient Procedure:                Colonoscopy WITH EMR & COLD SNARE/SNARE CAUTERY                            POLYPECTOMY Indications:              Personal history of colonic polyps Providers:                Barney Drain MD, MD, Lurline Del, RN, Randa Spike, Technician Referring MD:             Rita Ohara Medicines:                Meperidine 100 mg IV, Midazolam 6 mg IV Complications:            No immediate complications. Estimated Blood Loss:     Estimated blood loss was minimal. Procedure:                Pre-Anesthesia Assessment:                           - Prior to the procedure, a History and Physical                            was performed, and patient medications and                            allergies were reviewed. The patient's tolerance of                            previous anesthesia was also reviewed. The risks                            and benefits of the procedure and the sedation                            options and risks were discussed with the patient.                            All questions were answered, and informed consent                            was obtained. Prior Anticoagulants: The patient has                            taken Eliquis (apixaban), last dose was 3 days                            prior to procedure. ASA Grade Assessment: II - A  patient with mild systemic disease. After reviewing                            the risks and benefits, the patient was deemed in                            satisfactory condition to undergo the procedure.                            After obtaining informed consent, the colonoscope                            was passed under direct vision. Throughout the                  procedure, the patient's blood pressure, pulse, and                            oxygen saturations were monitored continuously. The                            [id number] ([id number]) scope was introduced through                            the anus and advanced to the the cecum, identified                            by appendiceal orifice and ileocecal valve. The                            colonoscopy was somewhat difficult due to a                            redundant colon. Successful completion of the                            procedure was aided by increasing the dose of                            sedation medication, straightening and shortening                            the scope to obtain bowel loop reduction and                            COLOWRAP. The patient tolerated the procedure                            fairly well. The quality of the bowel preparation                            was good. Scope In: 8:55:51 AM Scope Out: 9:52:56 AM Scope Withdrawal Time: 0 hours 53 minutes 41 seconds  Total Procedure Duration: 0 hours 57 minutes 5 seconds  Findings:  A 10 mm polyp was found in the proximal transverse colon. The polyp was       carpet-like and flat. Area was successfully injected with 4 mL ELEVIEW       for a lift polypectomy. The polyp was removed with a hot snare.       Resection and retrieval were complete. To prevent bleeding after the       polypectomy, one hemostatic clip was successfully placed (MR       conditional). There was no bleeding at the end of the procedure.      Four sessile polyps were found in the sigmoid colon, descending colon       and proximal transverse colon. The polyps were 4 to 7 mm in size. These       polyps were removed with a hot snare. Resection and retrieval were       complete.      Five sessile polyps were found in the descending colon, transverse colon       and ascending colon. The polyps were 2 to 5 mm in size. These  polyps       were removed with a cold snare. Resection and retrieval were complete.      Multiple small and large-mouthed diverticula were found in the entire       colon.      External hemorrhoids were found during retroflexion. The hemorrhoids       were moderate. Impression:               - One 10 mm polyp in the proximal transverse colon,                            removed with a hot snare. Resected and retrieved.                            Injected. Clip (MR conditional) was placed.                           - Four 4 to 7 mm polyps in the sigmoid colon, in                            the descending colon and in the proximal transverse                            colon, removed with a hot snare. Resected and                            retrieved.                           - Five 2 to 5 mm polyps in the descending colon, in                            the transverse colon and in the ascending colon,                            removed with a cold snare. Resected and retrieved.                           -  Diverticulosis in the entire examined colon.                           - External hemorrhoids. Moderate Sedation:      Moderate (conscious) sedation was administered by the endoscopy nurse       and supervised by the endoscopist. The following parameters were       monitored: oxygen saturation, heart rate, blood pressure, and response       to care. Total physician intraservice time was 69 minutes. Recommendation:           - Repeat colonoscopy 1-3 YEARS for surveillance.                           - Continue present medications. RE-START ELIQUIS                            JUL 23.                           - Await pathology results. OBTAIN KUB PRIOR TO MRI                            TO ASSESS FOR METAL CLIP IN COLON.                           - High fiber diet.                           - Patient has a contact number available for                            emergencies. The signs and  symptoms of potential                            delayed complications were discussed with the                            patient. Return to normal activities tomorrow.                            Written discharge instructions were provided to the                            patient. Procedure Code(s):        --- Professional ---                           [phone], Colonoscopy, flexible; with removal of                            tumor(s), polyp(s), or other lesion(s) by snare                            technique  [id number], Colonoscopy, flexible; with directed                            submucosal injection(s), any substance                           G0500, Moderate sedation services provided by the                            same physician or other qualified health care                            professional performing a gastrointestinal                            endoscopic service that sedation supports,                            requiring the presence of an independent trained                            observer to assist in the monitoring of the                            patient's level of consciousness and physiological                            status; initial 15 minutes of intra-service time;                            patient age 34 years or older (additional time may                            be reported with [phone], as appropriate)                           [phone], Moderate sedation services provided by the                            same physician or other qualified health care                            professional performing the diagnostic or                            therapeutic service that the sedation supports,                            requiring the presence of an independent trained                            observer to assist in the monitoring of the                            patient's  level of consciousness and physiological                             status; each additional 15 minutes intraservice                            time (List separately in addition to code for                            primary service)                           [phone], Moderate sedation services provided by the                            same physician or other qualified health care                            professional performing the diagnostic or                            therapeutic service that the sedation supports,                            requiring the presence of an independent trained                            observer to assist in the monitoring of the                            patient's level of consciousness and physiological                            status; each additional 15 minutes intraservice                            time (List separately in addition to code for                            primary service)                           [phone], Moderate sedation services provided by the                            same physician or other qualified health care                            professional performing the diagnostic or                            therapeutic service that the sedation supports,                            requiring the presence of an independent trained  observer to assist in the monitoring of the                            patient's level of consciousness and physiological                            status; each additional 15 minutes intraservice                            time (List separately in addition to code for                            primary service)                           [phone], Moderate sedation services provided by the                            same physician or other qualified health care                            professional performing the diagnostic or                            therapeutic service that the sedation supports,                             requiring the presence of an independent trained                            observer to assist in the monitoring of the                            patient's level of consciousness and physiological                            status; each additional 15 minutes intraservice                            time (List separately in addition to code for                            primary service) Diagnosis Code(s):        --- Professional ---                           D12.3, Benign neoplasm of transverse colon (hepatic                            flexure or splenic flexure)                           D12.5, Benign neoplasm of sigmoid colon  D12.4, Benign neoplasm of descending colon                           D12.2, Benign neoplasm of ascending colon                           K64.4, Residual hemorrhoidal skin tags                           Z86.010, Personal history of colonic polyps                           K57.30, Diverticulosis of large intestine without                            perforation or abscess without bleeding CPT copyright 2017 [employer]. All rights reserved. The codes documented in this report are preliminary and upon coder review may  be revised to meet current compliance requirements. Barney Drain, MD Barney Drain MD, MD 05/23/2018 10:13:09 AM This report has been signed electronically. Number of Addenda: 0

## 2018-05-26 ENCOUNTER — Encounter (HOSPITAL_COMMUNITY): Payer: Self-pay | Admitting: Gastroenterology

## 2018-05-26 DIAGNOSIS — L821 Other seborrheic keratosis: Secondary | ICD-10-CM | POA: Diagnosis not present

## 2018-05-26 DIAGNOSIS — X32XXXD Exposure to sunlight, subsequent encounter: Secondary | ICD-10-CM | POA: Diagnosis not present

## 2018-05-26 DIAGNOSIS — Z1283 Encounter for screening for malignant neoplasm of skin: Secondary | ICD-10-CM | POA: Diagnosis not present

## 2018-05-26 DIAGNOSIS — C44319 Basal cell carcinoma of skin of other parts of face: Secondary | ICD-10-CM | POA: Diagnosis not present

## 2018-05-26 DIAGNOSIS — L570 Actinic keratosis: Secondary | ICD-10-CM | POA: Diagnosis not present

## 2018-05-26 NOTE — Progress Notes (Signed)
PT is aware.

## 2018-05-26 NOTE — Progress Notes (Signed)
LMOM to call.

## 2018-05-27 ENCOUNTER — Telehealth: Payer: Self-pay

## 2018-05-27 NOTE — Telephone Encounter (Signed)
Pt left Vm requesting a copy of the path report from her procedure so she can encourage her children to make sure they have a colonoscopy.  I have put a copy in the mail and LMOM to inform her.

## 2018-06-17 ENCOUNTER — Encounter: Payer: Self-pay | Admitting: Cardiology

## 2018-06-17 ENCOUNTER — Ambulatory Visit: Payer: Medicare Other | Admitting: Cardiology

## 2018-06-17 VITALS — BP 124/78 | HR 92 | Ht 64.0 in | Wt 207.0 lb

## 2018-06-17 DIAGNOSIS — I4891 Unspecified atrial fibrillation: Secondary | ICD-10-CM | POA: Diagnosis not present

## 2018-06-17 DIAGNOSIS — I10 Essential (primary) hypertension: Secondary | ICD-10-CM

## 2018-06-17 NOTE — Patient Instructions (Signed)

## 2018-06-17 NOTE — Progress Notes (Signed)
Clinical Summary Ms. Kadrmas is a 77 y.o.female  seen today for follow up of the following medical problems.   1. Afib - no recent palpitations. NO bleeding on eliquis - compliant with    2. HTN  - home numbers 120s/60s when checks at home.     SH has 2 dogs. A 62 year old boxer , mixed breed dog 20 yo. Goes to water aerobics 3 times a wek   Past Medical History:  Diagnosis Date  . Anxiety   . Atrial fibrillation with rapid ventricular response ([place]) 11/05/2014   New dx  . 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 Shaune Leeks)  . Colon polyps    Dr. Oletta Lamas  . Colon polyps    path was small leiomyoma; tubular adenoma 12/2017  . Constipation   . Gout    history of (1985ish)  . Hemorrhoids   . History of IBS   . Hx of pelvic mass    complex, benign pathology  . Hyperlipidemia    elevated trigs and LDL  . Hypertension   . Hyponatremia    mild  . Osteopenia    mild (T-1.3 in 01/2014)  . Shingles 02/2015     Allergies  Allergen Reactions  . Bee Venom Anaphylaxis  . Epinephrine Other (See Comments)    High pulse rate.  . Sulfa Antibiotics Nausea And Vomiting  . Tetanus Toxoids Swelling and Other (See Comments)    Entire arm swelled  . Vancomycin Nausea And Vomiting  . Adhesive [Tape] Rash and Other (See Comments)    Her skin peels off.  . Influenza Vaccine Live Swelling and Rash  . Ivp Dye [Iodinated Diagnostic Agents] Rash  . Zyloprim [Allopurinol] Rash     Current Outpatient Medications  Medication Sig Dispense Refill  . acetaminophen (TYLENOL) 325 MG tablet Take 650 mg by mouth every 6 (six) hours as needed for moderate pain or headache.    [name] Kitchen CARTIA XT 180 MG 24 hr capsule TAKE 1 CAPSULE BY MOUTH ONCE DAILY**MEDICATION TO CONTROL YOUR HEART RATE** 90 capsule 1  . Cholecalciferol (VITAMIN D3) 2000 units TABS Take 2,000 Units by mouth daily.     . citalopram (CELEXA) 20 MG tablet Take 1 tablet (20 mg total) by mouth daily. 90 tablet 3  .  diphenhydrAMINE (BENADRYL) 25 MG tablet Take 25 mg by mouth every 6 (six) hours as needed for itching or allergies (ALLERGY VACCINE).     [name] Kitchen ELIQUIS 5 MG TABS tablet TAKE 1 TABLET BY MOUTH TWICE DAILY 60 tablet 6  . EPINEPHrine (EPIPEN 2-PAK) 0.3 mg/0.3 mL IJ SOAJ injection Inject 0.3 mg into the muscle once.     . fexofenadine (ALLEGRA) 180 MG tablet Take 180 mg by mouth daily as needed for allergies (ALLERGY VACINE).     [name] Kitchen losartan (COZAAR) 25 MG tablet TAKE 1 TABLET BY MOUTH ONCE DAILY 90 tablet 1  . Multiple Vitamins-Minerals (MULTIVITAMIN WITH MINERALS) tablet Take 1 tablet by mouth daily.       No current facility-administered medications for this visit.      Past Surgical History:  Procedure Laterality Date  . ABDOMINAL HYSTERECTOMY  1970   for perforated uterus from IUD  . BILATERAL SALPINGOOPHORECTOMY  2005   benign tumor/cyst (Dr. Aldean Ast)  . COLONOSCOPY N/A 12/31/2017   Procedure: COLONOSCOPY;  Surgeon: Danie Binder, MD;  Location: [facility];  Service: Endoscopy;  Laterality: N/A;  10:30am  . COLONOSCOPY N/A 05/23/2018  Procedure: COLONOSCOPY;  Surgeon: Danie Binder, MD;  Location: [facility];  Service: Endoscopy;  Laterality: N/A;  8:30am  . POLYPECTOMY  05/23/2018   Procedure: POLYPECTOMY;  Surgeon: Danie Binder, MD;  Location: [facility];  Service: Endoscopy;;  ascending,hepatic, sigmoid,transverse     Allergies  Allergen Reactions  . Bee Venom Anaphylaxis  . Epinephrine Other (See Comments)    High pulse rate.  . Sulfa Antibiotics Nausea And Vomiting  . Tetanus Toxoids Swelling and Other (See Comments)    Entire arm swelled  . Vancomycin Nausea And Vomiting  . Adhesive [Tape] Rash and Other (See Comments)    Her skin peels off.  . Influenza Vaccine Live Swelling and Rash  . Ivp Dye [Iodinated Diagnostic Agents] Rash  . Zyloprim [Allopurinol] Rash      Family History  Problem Relation Age of Onset  . Hypertension Mother   . Heart  disease Mother        MI  . Dementia Mother   . Cancer Father        stomach  . Cancer Brother        multiple myeloma  . Healthy Daughter   . Healthy Daughter   . Healthy Daughter   . Diabetes Neg Hx   . Breast cancer Neg Hx   . Colon cancer Neg Hx      Social History Ms. Ballengee reports that she has never smoked. She has never used smokeless tobacco. Ms. Moret reports that she drinks about 14.0 standard drinks of alcohol per week.   Review of Systems CONSTITUTIONAL: No weight loss, fever, chills, weakness or fatigue.  HEENT: Eyes: No visual loss, blurred vision, double vision or yellow sclerae.No hearing loss, sneezing, congestion, runny nose or sore throat.  SKIN: No rash or itching.  CARDIOVASCULAR: per hpi RESPIRATORY: No shortness of breath, cough or sputum.  GASTROINTESTINAL: No anorexia, nausea, vomiting or diarrhea. No abdominal pain or blood.  GENITOURINARY: No burning on urination, no polyuria NEUROLOGICAL: No headache, dizziness, syncope, paralysis, ataxia, numbness or tingling in the extremities. No change in bowel or bladder control.  MUSCULOSKELETAL: No muscle, back pain, joint pain or stiffness.  LYMPHATICS: No enlarged nodes. No history of splenectomy.  PSYCHIATRIC: No history of depression or anxiety.  ENDOCRINOLOGIC: No reports of sweating, cold or heat intolerance. No polyuria or polydipsia.  [name] Kitchen   Physical Examination Vitals:   06/17/18 0757  BP: 124/78  Pulse: 92  SpO2: 97%   Filed Weights   06/17/18 0757  Weight: 207 lb (93.9 kg)    Gen: resting comfortably, no acute distress HEENT: no scleral icterus, pupils equal round and reactive, no palptable cervical adenopathy,  CV: RRR, no m/r/g, no jvd Resp: Clear to auscultation bilaterally GI: abdomen is soft, non-tender, non-distended, normal bowel sounds, no hepatosplenomegaly MSK: extremities are warm, no edema.  Skin: warm, no rash Neuro:  no focal deficits Psych: appropriate  affect   Diagnostic Studies Jan 2016 echo Study Conclusions  - Left ventricle: The cavity size was normal. Wall thickness was increased in a pattern of moderate LVH. Systolic function was vigorous. The estimated ejection fraction was in the range of 65% to 70%. Diastolic function is abnormal, indeterminant grade. Wall motion was normal; there were no regional wall motion abnormalities. - Aortic valve: Mildly calcified annulus. Trileaflet; mildly thickened leaflets. - Mitral valve: Mildly calcified annulus. Mildly thickened leaflets .    Assessment and Plan  1. Afib - CHADS2Vasc scor eis 4, continue eliquis -  no recent symptoms, we will continue current meds   2. HTN - she is at goal, continue current meds    F/u 1 year     Arnoldo Lenis, M.D.

## 2018-06-23 DIAGNOSIS — Z08 Encounter for follow-up examination after completed treatment for malignant neoplasm: Secondary | ICD-10-CM | POA: Diagnosis not present

## 2018-06-23 DIAGNOSIS — Z85828 Personal history of other malignant neoplasm of skin: Secondary | ICD-10-CM | POA: Diagnosis not present

## 2018-07-02 ENCOUNTER — Other Ambulatory Visit: Payer: Self-pay | Admitting: Cardiology

## 2018-07-05 ENCOUNTER — Other Ambulatory Visit: Payer: Self-pay

## 2018-07-05 MED ORDER — LOSARTAN POTASSIUM 25 MG PO TABS: 25.0000 mg | ORAL_TABLET | Freq: Every day | ORAL | 1 refills | Status: DC

## 2018-07-05 MED ORDER — DILTIAZEM HCL ER COATED BEADS 180 MG PO CP24: ORAL_CAPSULE | ORAL | 1 refills | Status: DC

## 2018-07-05 NOTE — Telephone Encounter (Signed)
Refilled losartan and diltiazem

## 2018-07-19 ENCOUNTER — Encounter: Payer: Self-pay | Admitting: Allergy and Immunology

## 2018-07-19 ENCOUNTER — Ambulatory Visit: Payer: Medicare Other | Admitting: Allergy and Immunology

## 2018-07-19 VITALS — BP 130/78 | HR 88 | Resp 17 | Ht 62.0 in | Wt 209.6 lb

## 2018-07-19 DIAGNOSIS — T63441D Toxic effect of venom of bees, accidental (unintentional), subsequent encounter: Secondary | ICD-10-CM | POA: Diagnosis not present

## 2018-07-19 DIAGNOSIS — Z91038 Other insect allergy status: Secondary | ICD-10-CM

## 2018-07-19 MED ORDER — EPINEPHRINE 0.3 MG/0.3ML IJ SOAJ: 0.3000 mg | Freq: Once | INTRAMUSCULAR | 1 refills | Status: AC

## 2018-07-19 NOTE — Patient Instructions (Addendum)
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    Emergency allergy action plan is in place.  A refill prescription has been provided for epinephrine 0.3 mg autoinjector 2 pack along with instructions for its proper administration.   Return in about 1 year (around 07/20/2019), or if symptoms worsen or fail to improve.

## 2018-07-19 NOTE — Progress Notes (Signed)
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  She was last seen in this clinic in September 2018.  She reports that she has not been stung over the past year or to use epinephrine autoinjector.  She needs a refill for her EpiPen.  She is receiving venom immunotherapy injections without problems or complications.  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    Emergency allergy action plan is in place.  A refill prescription has been provided for epinephrine 0.3 mg autoinjector 2 pack along with instructions for its proper administration.   Meds ordered this encounter  Medications  . EPINEPHrine (EPIPEN 2-PAK) 0.3 mg/0.3 mL IJ SOAJ injection    Sig: Inject 0.3 mLs (0.3 mg total) into the muscle once for 1 dose.    Dispense:  0.3 mL    Refill:  1    Physical examination: Blood pressure 130/78, pulse 88, resp. rate 17, height 5\' 2"  (1.575 m), weight 209 lb 9.6 oz (95.1 kg), SpO2 95 %.  General: Alert, interactive, in no acute distress. Neck: Supple without lymphadenopathy. Lungs: Clear to auscultation without wheezing, rhonchi or rales. CV: Normal S1, S2 without murmurs. Skin: Warm and dry, without lesions or rashes.  The following portions of the patient's history were reviewed and updated as appropriate: allergies, current medications, past family history, past medical history, past social history, past surgical history and problem list.  Allergies as of 07/19/2018      Reactions   Bee Venom Anaphylaxis   Epinephrine Other (See Comments)   High pulse rate.   Sulfa Antibiotics Nausea And Vomiting   Tetanus Toxoids Swelling, Other (See Comments)   Entire arm swelled   Vancomycin Nausea And Vomiting   Adhesive [tape] Rash, Other (See Comments)   Her skin peels off.   Influenza Vaccine Live Swelling, Rash   Ivp Dye [iodinated Diagnostic Agents] Rash   Zyloprim [allopurinol] Rash      Medication List        Accurate as of 07/19/18  1:08 PM. Always use your most recent med list.          acetaminophen 325 MG tablet Commonly known as:  TYLENOL Take 650 mg by mouth every 6 (six) hours as needed for moderate pain or headache.   citalopram 20 MG tablet Commonly known as:  CELEXA Take 1 tablet (20 mg total) by mouth daily.   diltiazem 180 MG 24 hr capsule Commonly known as:  CARDIZEM CD TAKE 1 CAPSULE BY MOUTH ONCE DAILY   **MEDICATION TO CONTROL YOUR HEART RATE**   diphenhydrAMINE 25 MG tablet Commonly known as:  BENADRYL Take 25 mg by mouth every 6 (six) hours as needed for itching or allergies (ALLERGY VACCINE).   ELIQUIS 5 MG Tabs tablet Generic drug:  apixaban TAKE 1 TABLET BY MOUTH TWICE DAILY   EPINEPHrine 0.3 mg/0.3 mL Soaj injection Commonly known as:  EPI-PEN Inject 0.3 mLs (0.3 mg total) into the muscle once for 1 dose.   fexofenadine 180 MG tablet Commonly known as:  ALLEGRA Take 180 mg by mouth daily as needed for allergies (ALLERGY VACINE).  losartan 25 MG tablet Commonly known as:  COZAAR Take 1 tablet (25 mg total) by mouth daily.   multivitamin with minerals tablet Take 1 tablet by mouth daily.   Vitamin D3 2000 units Tabs Take 2,000 Units by mouth daily.       Allergies  Allergen Reactions  . Bee Venom Anaphylaxis  . Epinephrine Other (See Comments)    High pulse rate.  . Sulfa Antibiotics Nausea And Vomiting  . Tetanus Toxoids Swelling and Other (See Comments)    Entire arm swelled  . Vancomycin Nausea And Vomiting  . Adhesive [Tape] Rash and Other (See Comments)    Her skin peels off.  .  Influenza Vaccine Live Swelling and Rash  . Ivp Dye [Iodinated Diagnostic Agents] Rash  . Zyloprim [Allopurinol] Rash    I appreciate the opportunity to take part in Monique James's care. Please do not hesitate to contact me with questions.  Sincerely,   R. Edgar Frisk, MD

## 2018-07-19 NOTE — Assessment & Plan Note (Signed)
   Continue careful avoidance of flying insects and have access to epinephrine autoinjector 2 pack in case of systemic symptoms associated with a sting.    Emergency allergy action plan is in place.  A refill prescription has been provided for epinephrine 0.3 mg autoinjector 2 pack along with instructions for its proper administration.

## 2018-07-29 ENCOUNTER — Telehealth: Payer: Self-pay | Admitting: Cardiology

## 2018-07-29 NOTE — Telephone Encounter (Signed)
Wanting samples of ELIQUIS 5 MG TABS tablet [[id number]]  She's in the doughnut hole

## 2018-08-01 NOTE — Telephone Encounter (Signed)
2 boxes eliquis 5 mg # 14 each to pt, lot [id number], exp 09/2020

## 2018-08-11 DIAGNOSIS — L820 Inflamed seborrheic keratosis: Secondary | ICD-10-CM | POA: Diagnosis not present

## 2018-08-15 ENCOUNTER — Other Ambulatory Visit: Payer: Self-pay | Admitting: Pharmacist

## 2018-08-15 ENCOUNTER — Other Ambulatory Visit: Payer: Self-pay | Admitting: Cardiology

## 2018-08-15 NOTE — Patient Outreach (Addendum)
[facility]) Care Management  08/15/2018  Monique James May 16, 1941 [id number]   Incoming call from [facility] in response to the [facility] Medication Adherence Campaign. Speak with patient. HIPAA identifiers verified and verbal consent received.  Reports that she takes her losartan 25 mg once daily as directed. Denies any missed doses or barriers to adherence. Note that per Epic Medication Dispense Report, patient last had her losartan prescription frilled on 07/05/18 for a 90 day supply. Counsel on the importance of medication adherence.  Patient denies any medication questions or concerns at this time. Will close pharmacy episode.  Harlow Asa, PharmD, [place] Management [phone]

## 2018-09-01 ENCOUNTER — Other Ambulatory Visit: Payer: Self-pay | Admitting: Family Medicine

## 2018-09-01 DIAGNOSIS — Z1231 Encounter for screening mammogram for malignant neoplasm of breast: Secondary | ICD-10-CM

## 2018-09-15 ENCOUNTER — Ambulatory Visit (INDEPENDENT_AMBULATORY_CARE_PROVIDER_SITE_OTHER): Payer: Medicare Other | Admitting: *Deleted

## 2018-09-15 DIAGNOSIS — T63441D Toxic effect of venom of bees, accidental (unintentional), subsequent encounter: Secondary | ICD-10-CM

## 2018-10-14 ENCOUNTER — Ambulatory Visit (HOSPITAL_COMMUNITY)
Admission: RE | Admit: 2018-10-14 | Discharge: 2018-10-14 | Disposition: A | Discharge status: Home / Self Care | Payer: Medicare Other | Source: Ambulatory Visit | Attending: Family Medicine | Admitting: Family Medicine

## 2018-10-14 ENCOUNTER — Encounter (HOSPITAL_COMMUNITY): Payer: Self-pay

## 2018-10-14 DIAGNOSIS — Z1231 Encounter for screening mammogram for malignant neoplasm of breast: Secondary | ICD-10-CM | POA: Insufficient documentation

## 2018-10-17 ENCOUNTER — Other Ambulatory Visit: Payer: Self-pay | Admitting: *Deleted

## 2018-10-17 DIAGNOSIS — F411 Generalized anxiety disorder: Secondary | ICD-10-CM

## 2018-10-17 MED ORDER — CITALOPRAM HYDROBROMIDE 20 MG PO TABS: 20.0000 mg | ORAL_TABLET | Freq: Every day | ORAL | 0 refills | Status: DC

## 2018-11-02 NOTE — Progress Notes (Signed)
Chief Complaint  Patient presents with  . Hypertension   Patient presents for a routine 6 month follow up on her chronic medical problems, including her obesity.  We counseled a lot regarding weight and risks of obesity at her physical in July. She has lost 5 pounds multiple times and regained the weight. She continues to get regular exercise--1 hour of water aerobic 3x/week (goes early, and gets an extra 30 minutes). She has done [employer] in the past.  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    She has scales at home to weigh her food.  WW got to be too expensive.  She does not have a computer or use the internet. Her kids say she doesn't eat enough, feel frustrated. She reports having a good diet.  Doesn't snack much, and tries to snack on lower calorie foods, or portion controlled (popcorn, or 1/wk small bag of chips).  She just lost her boxer before Christmas.  Trying to adopt another dog.   Anxiety--related to diagnosis of atrial fibrillation. On citalopram and doing very well. Not needing any alprazolam. Denies side effects to citalopram. She weaned off (over 2 months) as a trial in the past, but after being off it a week, she had increased anxiety regarding her pulse and checking BP's, realized she was more anxious, and restarted it. Her anxiety is better since being back on the medication and prefers to continue with it. She denies medication side effects.  Atrial fibrillation--diagnosed 11/2014. Last saw cardiologist Dr. Harl Bowie in August, 2019, and no changes were made her her regimen. She continues on Eliquis.She denies chest pain, palpitations, bleeding, bruising. CBC's have all been normal, with no e/o anemia. Lab Results  Component Value Date   WBC 10.1 05/02/2018   HGB 14.5 05/02/2018   HCT 44.9 05/02/2018   MCV 98 (H) 05/02/2018   PLT 343 05/02/2018    Hypertension--currently well controlled on Cartiaand Losartan. BP's at home have been running  120's-130/upper 70's-80; pulse runs 80's  Hyperlipidemia: Controlled by diet, which remains the same--she has continued to limit her cheese intake, continuing to eat more fruits and vegetables. She cut out the creamy dressings. She mostly eats chicken and some pork, only has a burger once a month, if even that often. Has tuna without mayonnaise (uses vinegar). Denies any changes to her diet in the last year. Lab Results  Component Value Date   CHOL 237 (H) 05/02/2018   HDL 103 05/02/2018   LDLCALC 121 (H) 05/02/2018   TRIG 65 05/02/2018   CHOLHDL 2.3 05/02/2018   Vitamin D deficiency: Last level was26.3 in 05/2018.  At that time she wasn't taking her MVI or Ca with D daily (just 2-3x/week).  She is currently taking D3 2000 IU daily, plus daily MVI.  Takes Viactiv 2-3 times/week  Allergies: Risk analyst when she goes for her shots, and just seasonally just prn.She continues on allergy shots every 8 weeks.  She had colonoscopy 12/31/2017 which found a polyp in the cecum (tubular adenoma), diverticulosis, external hemorrhoids. Due to poor prep, she had another colonoscopy 05/2018. 10 polyps were found, one of which was 30mm.  Path revealed seven simple adenomas, one serrated adenoma, and 2 hyperplastic polyps. No advanced changes were seen.  GI reported that no repeat colonoscopy is needed (but that her family should have colonoscopies starting at the age of 63). She recalls possibly being told repeat in 3 years, but she was groggy and didn't recall  exactly what she was told.   PMH, PSH, SH reviewed  Outpatient Encounter Medications as of 11/03/2018  Medication Sig  . Cholecalciferol (VITAMIN D3) 2000 units TABS Take 2,000 Units by mouth daily.   . citalopram (CELEXA) 20 MG tablet Take 1 tablet (20 mg total) by mouth daily.  [name] Kitchen diltiazem (CARTIA XT) 180 MG 24 hr capsule TAKE 1 CAPSULE BY MOUTH ONCE DAILY   **MEDICATION TO CONTROL YOUR HEART RATE**  . ELIQUIS 5 MG TABS tablet TAKE 1 TABLET BY  MOUTH TWICE DAILY  . losartan (COZAAR) 25 MG tablet Take 1 tablet (25 mg total) by mouth daily.  . Multiple Vitamins-Minerals (MULTIVITAMIN WITH MINERALS) tablet Take 1 tablet by mouth daily.    [name] Kitchen acetaminophen (TYLENOL) 325 MG tablet Take 650 mg by mouth every 6 (six) hours as needed for moderate pain or headache.  . diphenhydrAMINE (BENADRYL) 25 MG tablet Take 25 mg by mouth every 6 (six) hours as needed for itching or allergies (ALLERGY VACCINE).   [name] Kitchen fexofenadine (ALLEGRA) 180 MG tablet Take 180 mg by mouth daily as needed for allergies (ALLERGY VACINE).    No facility-administered encounter medications on file as of 11/03/2018.    Allergies  Allergen Reactions  . Bee Venom Anaphylaxis  . Epinephrine Other (See Comments)    High pulse rate.  . Sulfa Antibiotics Nausea And Vomiting  . Tetanus Toxoids Swelling and Other (See Comments)    Entire arm swelled  . Vancomycin Nausea And Vomiting  . Adhesive [Tape] Rash and Other (See Comments)    Her skin peels off.  . Influenza Vaccine Live Swelling and Rash  . Ivp Dye [Iodinated Diagnostic Agents] Rash  . Zyloprim [Allopurinol] Rash    ROS: Denies fever, chills, headaches, dizziness, chest pain, palpitations, shortness of breath, cough. Alergies aren't flaring currently. Denies nausea, vomiting, heartburn, abdominal pain, bowel changes, urinary complaints, depression, insomnia. Denies joint pains (slight in hands). Anxiety is well controlled.   PHYSICAL EXAM:  BP (!) 142/88   Pulse 84   Ht 5\' 4"  (1.626 m)   Wt 210 lb (95.3 kg)   BMI 36.05 kg/m    134/76 on repeat by MD  Wt Readings from Last 3 Encounters:  11/03/18 210 lb (95.3 kg)  07/19/18 209 lb 9.6 oz (95.1 kg)  06/17/18 207 lb (93.9 kg)    Well developed, pleasant, obese female in no distress. Somewhat excitable in chatting (which raises her BP even higher when I rechecked it)  HEENT: PERRL, EOMI, conjunctiva and sclera are clear. EOMI. OP clear Neck: no  lymphadenopathy, thyromegaly or mass, no bruit Heart: regular rate and rhythm, no murmur. Lungs: clear bilaterally Back: no spinal or CVA tenderness Abdomen: obese, soft, nontender Extremities: no edema, normal pulses Psych: normal mood, affect, hygiene and grooming Neuro: alert and oriented, cranial nerves intact, normal gait.   ASSESSMENT/PLAN:  Essential hypertension - elevated today (excitable), normal per home values. Cont current meds, exercise. Encouraged low Na diet and wt loss  Atrial fibrillation with rapid ventricular response (HCC) - stable; continue current meds and anticoag  Vitamin D deficiency - cont daily D, MVI  Pure hypercholesterolemia - cont low cholesterol diet  Generalized anxiety disorder - continue citalopram, well controlled  Obesity (BMI 30-39.9) - counseled re: portions, intake, healthy diet, exercise. Rec tracking/logging food. Declines referral to wt loss clinic due to distance   Try and log/journal your food (as IF you were doing Weight Watchers), and even weight yourself weekly and record (like WW)--to  hold yourself accountable.  We discussed Weight Watchers, and also referral to the [facility] (in [place]), which you declined due to the distance.  Try and avoid skipping meals, ensure that you are eating at least 1200 calories/day. Eat more fruits and vegetables, limit your carbs (bread/rice/pasta/potatoes/crackers). Drink at least 6-8 glasses of water daily--an extra glass for every diet Coke you have.

## 2018-11-03 ENCOUNTER — Encounter: Payer: Self-pay | Admitting: Family Medicine

## 2018-11-03 ENCOUNTER — Ambulatory Visit (INDEPENDENT_AMBULATORY_CARE_PROVIDER_SITE_OTHER): Payer: Medicare Other | Admitting: Family Medicine

## 2018-11-03 VITALS — BP 134/76 | HR 84 | Ht 64.0 in | Wt 210.0 lb

## 2018-11-03 DIAGNOSIS — E559 Vitamin D deficiency, unspecified: Secondary | ICD-10-CM | POA: Diagnosis not present

## 2018-11-03 DIAGNOSIS — E669 Obesity, unspecified: Secondary | ICD-10-CM

## 2018-11-03 DIAGNOSIS — F411 Generalized anxiety disorder: Secondary | ICD-10-CM

## 2018-11-03 DIAGNOSIS — I4891 Unspecified atrial fibrillation: Secondary | ICD-10-CM | POA: Diagnosis not present

## 2018-11-03 DIAGNOSIS — E7800 Pure hypercholesterolemia, unspecified: Secondary | ICD-10-CM | POA: Diagnosis not present

## 2018-11-03 DIAGNOSIS — I10 Essential (primary) hypertension: Secondary | ICD-10-CM

## 2018-11-03 NOTE — Patient Instructions (Addendum)
Try and log/journal your food (as IF you were doing Weight Watchers), and even weight yourself weekly and record (like WW)--to hold yourself accountable.  We discussed Weight Watchers, and also referral to the [facility] (in [place]), which you declined due to the distance.  Try and avoid skipping meals, ensure that you are eating at least 1200 calories/day. Eat more fruits and vegetables, limit your carbs (bread/rice/pasta/potatoes/crackers). Drink at least 6-8 glasses of water daily--an extra glass for every diet Coke you have.    MyPlate from [employer] is an outline of a general healthy diet based on the 2010 Dietary Guidelines for Americans, from the U.S. Department of Agriculture Scientist, research (physical sciences)). It sets guidelines for how much food you should eat from each food group based on your age, sex, and level of physical activity. What are tips for following MyPlate? To follow MyPlate recommendations:  Eat a wide variety of fruits and vegetables, grains, and protein foods.  Serve smaller portions and eat less food throughout the day.  Limit portion sizes to avoid overeating.  Enjoy your food.  Get at least 150 minutes of exercise every week. This is about 30 minutes each day, 5 or more days per week. It can be difficult to have every meal look like MyPlate. Think about MyPlate as eating guidelines for an entire day, rather than each individual meal. Fruits and vegetables  Make half of your plate fruits and vegetables.  Eat many different colors of fruits and vegetables each day.  For a 2,000 calorie daily food plan, eat: ? 2 cups of vegetables every day. ? 2 cups of fruit every day.  1 cup is equal to: ? 1 cup raw or cooked vegetables. ? 1 cup raw fruit. ? 1 medium-sized orange, apple, or banana. ? 1 cup 100% fruit or vegetable juice. ? 2 cups raw leafy greens, such as lettuce, spinach, or kale. ?  cup dried fruit. Grains  One fourth of your  plate should be grains.  Make at least half of the grains you eat each day whole grains.  For a 2,000 calorie daily food plan, eat 6 oz of grains every day.  1 oz is equal to: ? 1 slice bread. ? 1 cup cereal. ?  cup cooked rice, cereal, or pasta. Protein  One fourth of your plate should be protein.  Eat a wide variety of protein foods, including meat, poultry, fish, eggs, beans, nuts, and tofu.  For a 2,000 calorie daily food plan, eat 5 oz of protein every day.  1 oz is equal to: ? 1 oz meat, poultry, or fish. ?  cup cooked beans. ? 1 egg. ?  oz nuts or seeds. ? 1 Tbsp peanut butter. Dairy  Drink fat-free or low-fat (1%) milk.  Eat or drink dairy as a side to meals.  For a 2,000 calorie daily food plan, eat or drink 3 cups of dairy every day.  1 cup is equal to: ? 1 cup milk, yogurt, cottage cheese, or soy milk (soy beverage). ? 2 oz processed cheese. ? 1 oz natural cheese. Fats, oils, salt, and sugars  Only small amounts of oils are recommended.  Avoid foods that are high in calories and low in nutritional value (empty calories), like foods high in fat or added sugars.  Choose foods that are low in salt (sodium). Choose foods that have less than 140 milligrams (mg) of sodium per serving.  Drink water instead of sugary drinks. Drink enough  water each day to keep your urine pale yellow. Where to find support  Work with your health care provider or a nutrition specialist (dietitian) to develop a customized eating plan that is right for you.  Download an app (mobile application) to help you track your daily food intake. Where to find more information  Go to [email or web address] for more information.  Learn more and log your daily food intake according to MyPlate using USDA's SuperTracker: www.supertracker.usda.gov Summary  MyPlate is a general guideline for healthy eating from the USDA. It is based on the 2010 Dietary Guidelines for Americans.  In general,  fruits and vegetables should take up  of your plate, grains should take up  of your plate, and protein should take up  of your plate. This information is not intended to replace advice given to you by your health care provider. Make sure you discuss any questions you have with your health care provider. Document Released: 11/08/2007 Document Revised: 01/18/2017 Document Reviewed: 01/18/2017 Elsevier Interactive Patient Education  2019 [employer].

## 2018-11-10 ENCOUNTER — Ambulatory Visit (INDEPENDENT_AMBULATORY_CARE_PROVIDER_SITE_OTHER): Payer: Medicare Other | Admitting: *Deleted

## 2018-11-10 DIAGNOSIS — T63441D Toxic effect of venom of bees, accidental (unintentional), subsequent encounter: Secondary | ICD-10-CM

## 2019-01-05 ENCOUNTER — Ambulatory Visit (INDEPENDENT_AMBULATORY_CARE_PROVIDER_SITE_OTHER): Payer: Medicare Other | Admitting: *Deleted

## 2019-01-05 DIAGNOSIS — T63441D Toxic effect of venom of bees, accidental (unintentional), subsequent encounter: Secondary | ICD-10-CM | POA: Diagnosis not present

## 2019-01-15 ENCOUNTER — Other Ambulatory Visit: Payer: Self-pay | Admitting: Family Medicine

## 2019-01-15 DIAGNOSIS — F411 Generalized anxiety disorder: Secondary | ICD-10-CM

## 2019-01-22 ENCOUNTER — Telehealth: Payer: Self-pay | Admitting: Physician Assistant

## 2019-01-22 ENCOUNTER — Telehealth: Payer: Self-pay | Admitting: Family Medicine

## 2019-01-22 NOTE — Telephone Encounter (Signed)
Noted some bruising in the elbow and up the arm, recommend continue observing since patient does not have pain to suggest bleeding into the joint. More likely to be subcutaneous bruising and rupture of small capillaries under the skin. Informed her to let us know if significant swelling or joint pain occurs.

## 2019-01-22 NOTE — Telephone Encounter (Signed)
Patient called answering service; spoke with patient. 2 days ago she noticed swelling and warmth at her elbow.  The redness and slight swelling spreads a little above and a little below the elbow, but that isn't warm.  There is no pain.  No fever (was 97.7 when checked). She has marked the area with a pen (around noon), hasn't changed yet.  She took benadryl at 10am, didn't notice any change.  Advised to ice, elevated, consider compression if increasing swelling. She is on anticoagulants so can't take NSAIDS. To monitor for spread of the redness, any fever. Will need to be seen tomorrow if increasing, redness, fever, pain.  She does NOT have a computer, no way to send [place] a picture or do an evaluation without a visit, and she understands this (discussed options of evaluation without coming to office, given her age, risks, but she is unable to do these).  If no increase in redness beyond her pen mark, and no fever or worsening, she can just continue to monitor it at home. Ddx bursitis, but need to r/o cellulitis which would need more aggressive treatment.

## 2019-01-23 ENCOUNTER — Encounter: Payer: Self-pay | Admitting: Family Medicine

## 2019-01-23 ENCOUNTER — Other Ambulatory Visit: Payer: Self-pay

## 2019-01-23 ENCOUNTER — Ambulatory Visit (INDEPENDENT_AMBULATORY_CARE_PROVIDER_SITE_OTHER): Payer: Medicare Other | Admitting: Family Medicine

## 2019-01-23 VITALS — BP 138/88 | HR 88 | Temp 97.0°F | Ht 64.0 in | Wt 207.8 lb

## 2019-01-23 DIAGNOSIS — M7022 Olecranon bursitis, left elbow: Secondary | ICD-10-CM | POA: Diagnosis not present

## 2019-01-23 DIAGNOSIS — T148XXA Other injury of unspecified body region, initial encounter: Secondary | ICD-10-CM

## 2019-01-23 NOTE — Progress Notes (Signed)
Chief Complaint  Patient presents with  . Joint Swelling    left elbow swelling and redness that is worsening. Started Friday. Not painful. Didn't fall.    Patient presents for evaluation of left elbow swelling and discoloration.  See phone message from yesterday. Since yesterday, she has noticed spread of the swelling into the forearm. The area at the elbow itself (olecranon process) is less swollen than it was yesterday. She denies warmth/heat or fever.  It still is not at all painful. She isn't aware of any trauma. She did get a new dog (7# shelter mix) that she carries and picks up a lot with her left arm, so that is a change in activity. She is on blood thinners. No other bleeding or bruising noted.  PMH, PSH, SH reviewed  Outpatient Encounter Medications as of 01/23/2019  Medication Sig  . Cholecalciferol (VITAMIN D3) 2000 units TABS Take 2,000 Units by mouth daily.   . citalopram (CELEXA) 20 MG tablet Take 1 tablet by mouth once daily  . diltiazem (CARTIA XT) 180 MG 24 hr capsule TAKE 1 CAPSULE BY MOUTH ONCE DAILY   **MEDICATION TO CONTROL YOUR HEART RATE**  . ELIQUIS 5 MG TABS tablet TAKE 1 TABLET BY MOUTH TWICE DAILY  . losartan (COZAAR) 25 MG tablet Take 1 tablet (25 mg total) by mouth daily.  . Multiple Vitamins-Minerals (MULTIVITAMIN WITH MINERALS) tablet Take 1 tablet by mouth daily.    [name] Kitchen acetaminophen (TYLENOL) 325 MG tablet Take 650 mg by mouth every 6 (six) hours as needed for moderate pain or headache.  . diphenhydrAMINE (BENADRYL) 25 MG tablet Take 25 mg by mouth every 6 (six) hours as needed for itching or allergies (ALLERGY VACCINE).   [name] Kitchen fexofenadine (ALLEGRA) 180 MG tablet Take 180 mg by mouth daily as needed for allergies (ALLERGY VACINE).    No facility-administered encounter medications on file as of 01/23/2019.    Allergies  Allergen Reactions  . Bee Venom Anaphylaxis  . Epinephrine Other (See Comments)    High pulse rate.  . Sulfa Antibiotics Nausea And  Vomiting  . Tetanus Toxoids Swelling and Other (See Comments)    Entire arm swelled  . Vancomycin Nausea And Vomiting  . Adhesive [Tape] Rash and Other (See Comments)    Her skin peels off.  . Influenza Vaccine Live Swelling and Rash  . Ivp Dye [Iodinated Diagnostic Agents] Rash  . Zyloprim [Allopurinol] Rash   ROS: No fever, chills, URI symptoms, chest pain, shortness of breath, urinary symptoms, rashes.  PHYSICAL EXAM:  BP 138/88   Pulse 88   Temp (!) 97 F (36.1 C) (Tympanic)   Ht 5\' 4"  (1.626 m)   Wt 207 lb 12.8 oz (94.3 kg)   BMI 35.67 kg/m   Well-appearing, pleasant female in no distress Left arm: Area of discoloration 15 x 13 cm Very small area of soft tissue welling over olecranon process, nontender, not tense, soft. There is yellowish-green discoloration ofer the most of the area, the more distal area has slight pink color and has more focal area of soft tissue swelling (toward the middle portion of the forearm (posteriorly), measuring 7.5 x 7.5cm  There is no significant warmth. No true erythema. Skin is intact without any lesions/sores. No left axillary lymphadenopathy   ASSESSMENT/PLAN:  Olecranon bursitis of left elbow  Hematoma    I think you have a small bursitis at the elbow. You have what appears to be bruising and probably a small hematoma.  Expect that this  spreads down the forearm before it starts to clear up. If you develop fever, pain, increasing redness, warmth, please contact us. No need to treat the bursitis at this point, since it isn't painful or large. If things change, let us know.

## 2019-01-23 NOTE — Patient Instructions (Addendum)
I think you have a small bursitis at the elbow. You have what appears to be bruising and probably a small hematoma.  Expect that this spreads down the forearm before it starts to clear up. If you develop fever, pain, increasing redness, warmth, please contact us. No need to treat the bursitis at this point, since it isn't painful or large. If things change, let us know.  Tylenol if needed for pain. No anti-inflammatories due to being on Eliquis.  Continue to ice the area as we discussed. Ice the area to help keep it from spreading. Eventually you can switch to heat to help dissolve/heal the hematoma (but not while actively increasing in size).   Hematoma A hematoma is a collection of blood under the skin, in an organ, in a body space, in a joint space, or in other tissue. The blood can thicken (clot) to form a lump that you can see and feel. The lump is often firm and may become sore and tender. Most hematomas get better in a few days to weeks. However, some hematomas may be serious and require medical care. Hematomas can range from very small to very large. What are the causes? This condition is caused by:  A blunt or penetrating injury.  A leakage from a blood vessel under the skin.  Some medical procedures, including surgeries, such as oral surgery, face lifts, and surgeries on the joints.  Some medical conditions that cause bleeding or bruising. There may be multiple hematomas that appear in different areas of the body. What increases the risk? You are more likely to develop this condition if:  You are an older adult.  You use blood thinners. What are the signs or symptoms?  Symptoms of this condition depend on where the hematoma is located.  Common symptoms of a hematoma that is under the skin include:  A firm lump on the body.  Pain and tenderness in the area.  Bruising. Blue, dark blue, purple-red, or yellowish skin (discoloration) may appear at the site of the hematoma  if the hematoma is close to the surface of the skin. Common symptoms of a hematoma that is deep in the tissues or body spaces may be less obvious. They include:  A collection of blood in the stomach (intra-abdominal hematoma). This may cause pain in the abdomen, weakness, fainting, and shortness of breath.  A collection of blood in the head (intracranial hematoma). This may cause a headache or symptoms such as weakness, trouble speaking or understanding, or a change in consciousness. How is this diagnosed? This condition is diagnosed based on:  Your medical history.  A physical exam.  Imaging tests, such as an ultrasound or CT scan. These may be needed if your health care provider suspects a hematoma in deeper tissues or body spaces.  Blood tests. These may be needed if your health care provider believes that the hematoma is caused by a medical condition. How is this treated? Treatment for this condition depends on the cause, size, and location of the hematoma. Treatment may include:  Doing nothing. The majority of hematomas do not need treatment as many of them go away on their own over time.  Surgery or close monitoring. This may be needed for large hematomas or hematomas that affect vital organs.  Medicines. Medicines may be given if there is an underlying medical cause for the hematoma. Follow these instructions at home: Managing pain, stiffness, and swelling   If directed, put ice on the affected area. ?  Put ice in a plastic bag. ? Place a towel between your skin and the bag. ? Leave the ice on for 20 minutes, 2-3 times a day for the first couple of days.  If directed, apply heat to the affected area after applying ice for a couple of days. Use the heat source that your health care provider recommends, such as a moist heat pack or a heating pad. ? Place a towel between your skin and the heat source. ? Leave the heat on for 20-30 minutes. ? Remove the heat if your skin turns  bright red. This is especially important if you are unable to feel pain, heat, or cold. You may have a greater risk of getting burned.  Raise (elevate) the affected area above the level of your heart while you are sitting or lying down.  If told, wrap the affected area with an elastic bandage. The bandage applies pressure (compression) to the area, which may help to reduce swelling and promote healing. Do not wrap the bandage too tightly around the affected area.  If your hematoma is on a leg or foot (lower extremity) and is painful, your health care provider may recommend crutches. Use them as told by your health care provider. General instructions  Take over-the-counter and prescription medicines only as told by your health care provider.  Keep all follow-up visits as told by your health care provider. This is important. Contact a health care provider if:  You have a fever.  The swelling or discoloration gets worse.  You develop more hematomas. Get help right away if:  Your pain is worse or your pain is not controlled with medicine.  Your skin over the hematoma breaks or starts bleeding.  Your hematoma is in your chest or abdomen and you have weakness, shortness of breath, or a change in consciousness.  You have a hematoma on your scalp that is caused by a fall or injury, and you also have: ? A headache that gets worse. ? Trouble speaking or understanding speech. ? Weakness. ? Change in alertness or consciousness. Summary  A hematoma is a collection of blood under the skin, in an organ, in a body space, in a joint space, or in other tissue.  This condition usually does not need treatment because many hematomas go away on their own over time.  Large hematomas, or those that may affect vital organs, may need surgical drainage or monitoring. If the hematoma is caused by a medical condition, medicines may be prescribed.  Get help right away if your hematoma breaks or starts to  bleed, you have shortness of breath, or you have a headache or trouble speaking after a fall. This information is not intended to replace advice given to you by your health care provider. Make sure you discuss any questions you have with your health care provider. Document Released: 06/02/2004 Document Revised: 03/24/2018 Document Reviewed: 03/24/2018 Elsevier Interactive Patient Education  2019 [employer].   Elbow Bursitis  Bursitis is swelling and pain at the tip of the elbow. This happens when fluid builds up in a sac under the skin (bursa). This may also be called olecranon bursitis. What are the causes? Elbow bursitis may be caused by:  Elbow injury, such as falling onto the elbow.  Leaning on hard surfaces for long periods of time.  Infection from an injury that breaks the skin near the elbow.  A bone growth (spur) that forms at the tip of the elbow.  A medical condition  that causes inflammation, such as gout or rheumatoid arthritis. Sometimes the cause is not known. What are the signs or symptoms? The first sign of elbow bursitis is usually swelling at the tip of the elbow. This can grow to be about the size of a golf ball. Swelling may start suddenly or develop gradually. Other symptoms may include:  Pain when bending or leaning on the elbow.  Not being able to move the elbow normally. If bursitis is caused by an infection, you may have:  Redness, warmth, and tenderness of the elbow.  Drainage of pus from the swollen area over the elbow, if the skin breaks open. How is this diagnosed? This condition may be diagnosed based on:  Your symptoms and medical history.  Any recent injuries you have had.  A physical exam.  X-rays to check for a bone spur or fracture.  Draining fluid from the bursa to test it for infection.  Blood tests to rule out gout or rheumatoid arthritis. How is this treated? Treatment for elbow bursitis depends on the cause. Treatment may  include:  Medicines. These may include: ? Over-the-counter medicines to relieve pain and inflammation. ? Antibiotic medicines. ? Injections of anti-inflammatory medicines (steroids).  Draining fluid from the bursa.  Wrapping your elbow with a bandage.  Wearing elbow pads. If these treatments do not help, you may need surgery to remove the bursa. Follow these instructions at home: Medicines  Take over-the-counter and prescription medicines only as told by your health care provider.  If you were prescribed an antibiotic medicine, take it as told by your health care provider. Do not stop taking the antibiotic even if you start to feel better. Managing pain, stiffness, and swelling   If directed, put ice on your elbow: ? Put ice in a plastic bag. ? Place a towel between your skin and the bag. ? Leave the ice on for 20 minutes, 2-3 times a day.  If your bursitis is caused by an injury, rest your elbow and wear your bandage as told by your health care provider.  Use elbow pads or elbow wraps to cushion your elbow as needed. General instructions  Avoid any activities that cause elbow pain. Ask your health care provider what activities are safe for you.  Keep all follow-up visits as told by your health care provider. This is important. Contact a health care provider if you have:  A fever.  Symptoms that do not get better with treatment.  Pain or swelling that: ? Gets worse. ? Goes away and then comes back.  Pus draining from your elbow. Get help right away if you have:  Trouble moving your arm, hand, or fingers. Summary  Elbow bursitis is inflammation of the fluid-filled sac (bursa) between the tip of your elbow bone (olecranon) and your skin.  Treatment for elbow bursitis depends on the cause. It may include medicines to relieve pain and inflammation, antibiotic medicines, and draining fluid from your elbow.  Contact a health care provider if your symptoms do not get  better with treatment, or if your symptoms go away and then come back. This information is not intended to replace advice given to you by your health care provider. Make sure you discuss any questions you have with your health care provider. Document Released: 11/18/2006 Document Revised: 09/28/2017 Document Reviewed: 09/28/2017 Elsevier Interactive Patient Education  2019 [employer].

## 2019-03-02 ENCOUNTER — Ambulatory Visit (INDEPENDENT_AMBULATORY_CARE_PROVIDER_SITE_OTHER): Payer: Medicare Other

## 2019-03-02 DIAGNOSIS — T63441D Toxic effect of venom of bees, accidental (unintentional), subsequent encounter: Secondary | ICD-10-CM | POA: Diagnosis not present

## 2019-03-13 DIAGNOSIS — D225 Melanocytic nevi of trunk: Secondary | ICD-10-CM | POA: Diagnosis not present

## 2019-03-13 DIAGNOSIS — Z1283 Encounter for screening for malignant neoplasm of skin: Secondary | ICD-10-CM | POA: Diagnosis not present

## 2019-03-13 DIAGNOSIS — Z08 Encounter for follow-up examination after completed treatment for malignant neoplasm: Secondary | ICD-10-CM | POA: Diagnosis not present

## 2019-03-13 DIAGNOSIS — Z85828 Personal history of other malignant neoplasm of skin: Secondary | ICD-10-CM | POA: Diagnosis not present

## 2019-04-10 ENCOUNTER — Other Ambulatory Visit: Payer: Self-pay | Admitting: Family Medicine

## 2019-04-10 DIAGNOSIS — F411 Generalized anxiety disorder: Secondary | ICD-10-CM

## 2019-04-10 NOTE — Telephone Encounter (Signed)
[facility] is requesting to fill pt Celexa. Please advise [facility]

## 2019-04-27 ENCOUNTER — Ambulatory Visit (INDEPENDENT_AMBULATORY_CARE_PROVIDER_SITE_OTHER): Payer: Medicare Other | Admitting: *Deleted

## 2019-04-27 ENCOUNTER — Other Ambulatory Visit: Payer: Self-pay

## 2019-04-27 DIAGNOSIS — T63441D Toxic effect of venom of bees, accidental (unintentional), subsequent encounter: Secondary | ICD-10-CM | POA: Diagnosis not present

## 2019-05-01 ENCOUNTER — Other Ambulatory Visit: Payer: Self-pay | Admitting: *Deleted

## 2019-05-01 DIAGNOSIS — Z5181 Encounter for therapeutic drug level monitoring: Secondary | ICD-10-CM

## 2019-05-01 DIAGNOSIS — Z7901 Long term (current) use of anticoagulants: Secondary | ICD-10-CM

## 2019-05-01 DIAGNOSIS — E559 Vitamin D deficiency, unspecified: Secondary | ICD-10-CM

## 2019-05-02 ENCOUNTER — Other Ambulatory Visit: Payer: Self-pay

## 2019-05-02 ENCOUNTER — Other Ambulatory Visit: Payer: Medicare Other

## 2019-05-02 DIAGNOSIS — E559 Vitamin D deficiency, unspecified: Secondary | ICD-10-CM

## 2019-05-02 DIAGNOSIS — Z5181 Encounter for therapeutic drug level monitoring: Secondary | ICD-10-CM | POA: Diagnosis not present

## 2019-05-02 DIAGNOSIS — Z7901 Long term (current) use of anticoagulants: Secondary | ICD-10-CM

## 2019-05-02 LAB — LIPID PANEL

## 2019-05-03 LAB — LIPID PANEL
Chol/HDL Ratio: 2.2 ratio (ref 0.0–4.4)
Cholesterol, Total: 242 mg/dL — ABNORMAL HIGH (ref 100–199)
HDL: 111 mg/dL (ref >39)
LDL Calculated: 119 mg/dL — ABNORMAL HIGH (ref 0–99)
Triglycerides: 61 mg/dL (ref 0–149)
VLDL Cholesterol Cal: 12 mg/dL (ref 5–40)

## 2019-05-03 LAB — COMPREHENSIVE METABOLIC PANEL
ALT: 10 IU/L (ref 0–32)
AST: 15 IU/L (ref 0–40)
Albumin/Globulin Ratio: 1.4 (ref 1.2–2.2)
Albumin: 4.4 g/dL (ref 3.7–4.7)
Alkaline Phosphatase: 78 IU/L (ref 39–117)
BUN/Creatinine Ratio: 14 (ref 12–28)
BUN: 12 mg/dL (ref 8–27)
Bilirubin Total: 0.4 mg/dL (ref 0.0–1.2)
CO2: 24 mmol/L (ref 20–29)
Calcium: 9.8 mg/dL (ref 8.7–10.3)
Chloride: 92 mmol/L — ABNORMAL LOW (ref 96–106)
Creatinine, Ser: 0.83 mg/dL (ref 0.57–1.00)
GFR calc Af Amer: 78 mL/min/{1.73_m2} (ref >59)
GFR calc non Af Amer: 67 mL/min/{1.73_m2} (ref >59)
Globulin, Total: 3.1 g/dL (ref 1.5–4.5)
Glucose: 95 mg/dL (ref 65–99)
Potassium: 4.8 mmol/L (ref 3.5–5.2)
Sodium: 136 mmol/L (ref 134–144)
Total Protein: 7.5 g/dL (ref 6.0–8.5)

## 2019-05-03 LAB — CBC WITH DIFFERENTIAL/PLATELET
Basophils Absolute: 0.1 10*3/uL (ref 0.0–0.2)
Basos: 1 %
EOS (ABSOLUTE): 0.2 10*3/uL (ref 0.0–0.4)
Eos: 2 %
Hematocrit: 41.8 % (ref 34.0–46.6)
Hemoglobin: 13.8 g/dL (ref 11.1–15.9)
Immature Grans (Abs): 0 10*3/uL (ref 0.0–0.1)
Immature Granulocytes: 0 %
Lymphocytes Absolute: 2.7 10*3/uL (ref 0.7–3.1)
Lymphs: 35 %
MCH: 31.7 pg (ref 26.6–33.0)
MCHC: 33 g/dL (ref 31.5–35.7)
MCV: 96 fL (ref 79–97)
Monocytes Absolute: 0.6 10*3/uL (ref 0.1–0.9)
Monocytes: 8 %
Neutrophils Absolute: 4.3 10*3/uL (ref 1.4–7.0)
Neutrophils: 54 %
Platelets: 348 10*3/uL (ref 150–450)
RBC: 4.35 x10E6/uL (ref 3.77–5.28)
RDW: 12 % (ref 11.7–15.4)
WBC: 7.9 10*3/uL (ref 3.4–10.8)

## 2019-05-03 LAB — VITAMIN D 25 HYDROXY (VIT D DEFICIENCY, FRACTURES): Vit D, 25-Hydroxy: 30.2 ng/mL (ref 30.0–100.0)

## 2019-05-08 ENCOUNTER — Other Ambulatory Visit: Payer: Self-pay

## 2019-05-08 ENCOUNTER — Encounter: Payer: Self-pay | Admitting: Family Medicine

## 2019-05-08 ENCOUNTER — Ambulatory Visit (INDEPENDENT_AMBULATORY_CARE_PROVIDER_SITE_OTHER): Payer: Medicare Other | Admitting: Family Medicine

## 2019-05-08 ENCOUNTER — Ambulatory Visit: Payer: Self-pay | Admitting: Family Medicine

## 2019-05-08 VITALS — BP 144/80 | HR 78 | Ht 64.0 in | Wt 206.0 lb

## 2019-05-08 DIAGNOSIS — E559 Vitamin D deficiency, unspecified: Secondary | ICD-10-CM

## 2019-05-08 DIAGNOSIS — I10 Essential (primary) hypertension: Secondary | ICD-10-CM | POA: Diagnosis not present

## 2019-05-08 DIAGNOSIS — I4891 Unspecified atrial fibrillation: Secondary | ICD-10-CM | POA: Diagnosis not present

## 2019-05-08 DIAGNOSIS — E669 Obesity, unspecified: Secondary | ICD-10-CM

## 2019-05-08 DIAGNOSIS — Z7189 Other specified counseling: Secondary | ICD-10-CM

## 2019-05-08 DIAGNOSIS — Z0000 Encounter for general adult medical examination without abnormal findings: Secondary | ICD-10-CM | POA: Diagnosis not present

## 2019-05-08 DIAGNOSIS — E7800 Pure hypercholesterolemia, unspecified: Secondary | ICD-10-CM | POA: Diagnosis not present

## 2019-05-08 DIAGNOSIS — Z7185 Encounter for immunization safety counseling: Secondary | ICD-10-CM

## 2019-05-08 DIAGNOSIS — Z7901 Long term (current) use of anticoagulants: Secondary | ICD-10-CM

## 2019-05-08 DIAGNOSIS — M8580 Other specified disorders of bone density and structure, unspecified site: Secondary | ICD-10-CM

## 2019-05-08 NOTE — Progress Notes (Signed)
Start time: 9:34 End time: 10:16  Virtual Visit via Telephone Note  I connected with Monique James on 05/08/19 at  9:45 AM EDT by telephone and verified that I am speaking with the correct person using two identifiers. She does not have access for video visit.  She consents to insurance being filed for this visit.  Location: Patient: home Provider: office   I discussed the limitations, risks, security and privacy concerns of performing an evaluation and management service by telephone and the availability of in person appointments. I also discussed with the patient that there may be a patient responsible charge related to this service. The patient expressed understanding and agreed to proceed.   History of Present Illness:  Chief Complaint  Patient presents with  . Medicare Wellness    PHONE CALL AWV. No concerns.     Today's visit is for Medicare Annual Wellness visit and follow-up on chronic problems.  She had labs done prior to her visit see below.  She was seen 3 months ago for left olecranon bursitis, bruising and possible hematoma. She reports this completely resolved.  She notes slight wrist swelling fairly chronically at the right wrist, which she attributes to arthritis. No significant changes/worsening.  Thinks her crocheting may contribute.  Anxiety--related to diagnosis of atrial fibrillation. On citalopram and doing very well. Not needing any alprazolam. Denies side effects to citalopram. She weaned off as a trial in the past, butafter being off it a week, she had increased anxiety regarding her pulse and checking BP's, realized she was more anxious, and restarted it. Her anxiety is better since being back on the medication and prefers to continue with it.She denies medication side effects.  Atrial fibrillation--diagnosed 11/2014. Last saw cardiologist Dr. Harl Bowie in August, 2019,and no changes were made her her regimen. She is scheduled again for September.  She  continues on Eliquis.She denies chest pain, palpitations, bleeding, bruising. CBC's have all been normal, with no e/o anemia.  Hypertension--currently well controlled on Cartiaand Losartan.BP was up this morning (see below), due to being upset that she accidentally cancelled this appointment (was rectified). BP was 132/74 yesterday, P 83.  BP is usually running 120-130's/74-80. Pulse is usually 70-80 (up to 88).  Hyperlipidemia: Controlled by diet, which remains the same--she has continued to limit her cheese intake, continuing to eat more fruits and vegetables. She cut out the creamy dressings. She mostly eats chicken and some pork, only has a burger once a month, if even that often. Has tuna, very small amount of mayo. Denies any changes to her diet in the last year. She has had excellent HDL.  Vitamin D deficiency: Last level was26.3 in 05/2018.  At that time she wasn't taking her MVI or Ca with D daily (just 2-3x/week).  She is currently taking D3 2000 IU daily, plus daily MVI.    Allergies: Takes Allegra when she goes for her shots, and just seasonally just prn.She continues on allergy shotsevery 8 weeks.  She had colonoscopy3/11/2017 which found a polypin the cecum (tubular adenoma), diverticulosis, external hemorrhoids. Due to poor prep, she had another colonoscopy 05/2018. 10 polyps were found, one of which was 15m.  Path revealed seven simple adenomas, one serrated adenoma, and 2 hyperplastic polyps. No advanced changes were seen.  GI reported that no repeat colonoscopy is needed (but that her family should have colonoscopies starting at the age of 414. She recalls possibly being told repeat in 3 years, but she was groggy and didn't recall  exactly what she was told.  Obesity:  Counseled extensively at prior visits.  She is happy that she has maintained her weight (given that she hasn't left her house much in the last few months, and [facility]  Previously has done [employer], still has scale.  She was encouraged to log/journal her food as if she was doing [employer], to hold herself accountable.  She did it for a while, but stopped.  She watches her portions closely (for meat and cheese). She declined weight loss clinic due to distance.  She doesn't have computer access for virtual visits. She is hoping that she will see more weight loss when she resumes exercise at the [facility]. She continues to have wine every night, 1 glass, sometimes 2, and has had 2 glasses 2-3 nights/week over the last couple of months.  Immunization History  Administered Date(s) Administered  . Pneumococcal Conjugate-13 10/07/2015   Allergic to tetanus, flu shots. She previously refused pneumovax, and zostavax  Last Pap smear: n/a, s/p hysterectomy  Last mammogram:10/2018 Last colonoscopy: 05/2018 Last DEXA: 01/2014 at [facility]. T-1.3 Ophtho: yearly Dentist: twice yearly  Exercise: over the last 3 months (during COVID-19 pandemic) has just been walking around the house as much as she could, "not enough" Normal routine (which will be resumed later this week)--water aerobics 3x/week, some with weights(1 hour water aerobics class), plus walking   Other doctors caring for patient include: Dentist: Dr. Truman Hayward Ophtho: Dr. Jorja Loa (at [facility]) Allergist: Dr. Shaune Leeks GI: Dr. Barney Drain Cardiologist: Dr. Harl Bowie Dermatologist: Dr. Nevada Crane (last seen a few months ago, will resume yearly).  Depression screen: negative Fall screen:none in the last year Functional Status screen: negative (other than forgetting why she walked into a room, nothing significant). Mini-cog screen: unable to do clock portion due to telephone nature of visit.  Word recall 3/3 See epic for full questionnaires.  End of Life Discussion: Patient does not havea living will and medical power of attorney-has forms filled out,just needed to get it notarized, but then lost it. She got new forms last  year, filled out, but still not yet notarized.   Past Medical History:  Diagnosis Date  . Anxiety   . Atrial fibrillation with rapid ventricular response ([place]) 11/05/2014   New dx  . 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 Shaune Leeks)  . Colon polyps    Dr. Oletta Lamas  . Colon polyps    path was small leiomyoma; tubular adenoma 12/2017  . Constipation   . Gout    history of (1985ish)  . Hemorrhoids   . History of IBS   . Hx of pelvic mass    complex, benign pathology  . Hyperlipidemia    elevated trigs and LDL  . Hypertension   . Hyponatremia    mild  . Osteopenia    mild (T-1.3 in 01/2014)  . Shingles 02/2015    Past Surgical History:  Procedure Laterality Date  . ABDOMINAL HYSTERECTOMY  1970   for perforated uterus from IUD  . BILATERAL SALPINGOOPHORECTOMY  2005   benign tumor/cyst (Dr. Aldean Ast)  . COLONOSCOPY N/A 12/31/2017   Procedure: COLONOSCOPY;  Surgeon: Danie Binder, MD;  Location: [facility];  Service: Endoscopy;  Laterality: N/A;  10:30am  . COLONOSCOPY N/A 05/23/2018   Procedure: COLONOSCOPY;  Surgeon: Danie Binder, MD;  Location: [facility];  Service: Endoscopy;  Laterality: N/A;  8:30am  . POLYPECTOMY  05/23/2018   Procedure: POLYPECTOMY;  Surgeon: Danie Binder, MD;  Location: [facility];  Service: Endoscopy;;  ascending,hepatic, sigmoid,transverse    Social History   Socioeconomic History  . Marital status: Widowed    Spouse name: Not on file  . Number of children: 3  . Years of education: Not on file  . Highest education level: Not on file  Occupational History  . Occupation: retired Animal nutritionist  . Financial resource strain: Not on file  . Food insecurity    Worry: Not on file    Inability: Not on file  . Transportation needs    Medical: Not on file    Non-medical: Not on file  Tobacco Use  . Smoking status: Never Smoker  . Smokeless tobacco: Never Used  Substance and Sexual Activity  . Alcohol use: Yes     Alcohol/week: 14.0 standard drinks    Types: 14 Glasses of wine per week    Comment: 1 glass of wine daily, occasionally 2 (2-3x/week having two, over the last few months)  . Drug use: No  . Sexual activity: Not Currently  Lifestyle  . Physical activity    Days per week: Not on file    Minutes per session: Not on file  . Stress: Not on file  Relationships  . Social Herbalist on phone: Not on file    Gets together: Not on file    Attends religious service: Not on file    Active member of club or organization: Not on file    Attends meetings of clubs or organizations: Not on file    Relationship status: Not on file  . Intimate partner violence    Fear of current or ex partner: Not on file    Emotionally abused: Not on file    Physically abused: Not on file    Forced sexual activity: Not on file  Other Topics Concern  . Not on file  Social History Narrative   Lives alone, 1 dog (got 7# shelter mix after her boxer passed away);  Daughter lives nearby.  3 grandchildren.  Other children in [place].   Widowed (age 83--husband had aortic aneurysm repair, and graft failed/ruptured)--death anniversary date 2023-10-11.  He had been a Holocaust survivor.    Family History  Problem Relation Age of Onset  . Hypertension Mother   . Heart disease Mother        MI  . Dementia Mother   . Cancer Father        stomach  . Cancer Brother        multiple myeloma  . Healthy Daughter   . Healthy Daughter   . Healthy Daughter   . Diabetes Neg Hx   . Breast cancer Neg Hx   . Colon cancer Neg Hx     Outpatient Encounter Medications as of 05/08/2019  Medication Sig  . Cholecalciferol (VITAMIN D3) 2000 units TABS Take 2,000 Units by mouth daily.   . citalopram (CELEXA) 20 MG tablet Take 1 tablet by mouth once daily  . diltiazem (CARTIA XT) 180 MG 24 hr capsule TAKE 1 CAPSULE BY MOUTH ONCE DAILY   **MEDICATION TO CONTROL YOUR HEART RATE**  . ELIQUIS 5 MG TABS tablet TAKE 1  TABLET BY MOUTH TWICE DAILY  . fexofenadine (ALLEGRA) 180 MG tablet Take 180 mg by mouth daily as needed for allergies (ALLERGY VACINE).   [name] Kitchen losartan (COZAAR) 25 MG tablet Take 1 tablet (25 mg total) by mouth daily.  [name] Kitchen  Multiple Vitamins-Minerals (MULTIVITAMIN WITH MINERALS) tablet Take 1 tablet by mouth daily.    [name] Kitchen acetaminophen (TYLENOL) 325 MG tablet Take 650 mg by mouth every 6 (six) hours as needed for moderate pain or headache.  . diphenhydrAMINE (BENADRYL) 25 MG tablet Take 25 mg by mouth every 6 (six) hours as needed for itching or allergies (ALLERGY VACCINE).    No facility-administered encounter medications on file as of 05/08/2019.     Allergies  Allergen Reactions  . Bee Venom Anaphylaxis  . Epinephrine Other (See Comments)    High pulse rate.  . Sulfa Antibiotics Nausea And Vomiting  . Tetanus Toxoids Swelling and Other (See Comments)    Entire arm swelled  . Vancomycin Nausea And Vomiting  . Adhesive [Tape] Rash and Other (See Comments)    Her skin peels off.  . Influenza Vaccine Live Swelling and Rash  . Ivp Dye [Iodinated Diagnostic Agents] Rash  . Zyloprim [Allopurinol] Rash    ROS: The patient denies anorexia, fever, headaches, vision changes, decreased hearing, ear pain, sore throat, breast concerns, chest pain, palpitations, dizziness, syncope, dyspnea on exertion, cough, swelling, nausea, vomiting, diarrhea,abdominal pain, melena, hematochezia, hematuria, incontinence, dysuria, vaginal bleeding, discharge, odor or itch, numbness, tingling, weakness, tremor, suspicious skin lesions, abnormal bleeding/bruising, or enlarged lymph nodes. +arthritis in hands (especially R ring finger), some at right wrist (slight swelling, crochets a lot) and R knee, no significant pain, but notices with weather changes. Rare heartburn with fatty foods (rare, relieved by Tums). Some mild constipation, controlled with high fiber diet and stool softenersprn. Anxiety is well controlled,  denies depression.   Observations/Objective:  BP (!) 144/80   Pulse 78   Ht '5\' 4"'  (1.626 m)   Wt 206 lb (93.4 kg)   BMI 35.36 kg/m   Wt Readings from Last 3 Encounters:  01/23/19 207 lb 12.8 oz (94.3 kg)  11/03/18 210 lb (95.3 kg)  07/19/18 209 lb 9.6 oz (95.1 kg)   She is alert, oriented, and in good spirits. Talkative.  Normal mood, affect.  Exam is limited due to virtual nature of the visit.    Chemistry      Component Value Date/Time   NA 136 05/02/2019 0908   K 4.8 05/02/2019 0908   CL 92 (L) 05/02/2019 0908   CO2 24 05/02/2019 0908   BUN 12 05/02/2019 0908   CREATININE 0.83 05/02/2019 0908   CREATININE 0.83 04/05/2017 1008      Component Value Date/Time   CALCIUM 9.8 05/02/2019 0908   ALKPHOS 78 05/02/2019 0908   AST 15 05/02/2019 0908   ALT 10 05/02/2019 0908   BILITOT 0.4 05/02/2019 0908     Fasting glucose 95  Lab Results  Component Value Date   CHOL 242 (H) 05/02/2019   HDL 111 05/02/2019   LDLCALC 119 (H) 05/02/2019   TRIG 61 05/02/2019   CHOLHDL 2.2 05/02/2019   Lab Results  Component Value Date   WBC 7.9 05/02/2019   HGB 13.8 05/02/2019   HCT 41.8 05/02/2019   MCV 96 05/02/2019   PLT 348 05/02/2019   Vitamin D-OH 30.2   Assessment and Plan:  Medicare annual wellness visit, subsequent   Essential hypertension - elevated today, but was upset/excitable. Better yesterday. Cont to monitor, goals reviewed  Atrial fibrillation with rapid ventricular response (H[facility]) - rate controlled, anticoagulated  Pure hypercholesterolemia - excellent HDL; continue lowfat diet  Anticoagulant long-term use   Obesity (BMI 30-39.9) - counseled re: healthy diet, weight loss.  Resuming  exercise is not enough to lose what she needs to. Strongly encouraged cutting back on alcohol intake  Osteopenia, unspecified location - rec 5 year f/u; order entered to be done at [facility], pt advised to schedule. Counseled re: wt bearing exercise - Plan: DG Bone  Density  Vitamin D deficiency - at lower end of normal now. Cont current supplements, advised to double up on D during the winter months.  Immunization counseling - discussed pneumovax and shingrix in detail. Strongly encouraged getting both (esp pneumovax), and where to get, risks/SE  Obesity--encouraged to resume tracking her food, exercise, weekly weights. Encouraged to cut back on alcohol, simply starting exercise will NOT be enough to lose what she needs to.   Discussed monthly self breast exams and yearly mammograms; at least 30 minutes of aerobic activity at least 5 days/week, weight-bearing exercise 2-3x/wk; proper sunscreen use reviewed; healthy diet, including goals of calcium and vitamin D intake and alcohol recommendations (less than or equal to 1 drink/day) reviewed; regular seatbelt use; changing batteries in smoke detectors. Immunization recommendations discussed--allergic to flu shots.Shingrix recommended, as is pneumovax. She is hesitant, counseled in detail  MOST form reviewed, unchanged (unable to sign due to virtual ntaure of the visit).  Reminded to get living will and healthcare POA notarized, and to get [place] copy.  F/u med check in 6 mos Print and mail AVS    Follow Up Instructions:    I discussed the assessment and treatment plan with the patient. The patient was provided an opportunity to ask questions and all were answered. The patient agreed with the plan and demonstrated an understanding of the instructions.   The patient was advised to call back or seek an in-person evaluation if the symptoms worsen or if the condition fails to improve as anticipated.  I provided 42 minutes of non-face-to-face time during this encounter.   Vikki Ports, MD  Medicare Attestation I have personally reviewed: The patient's medical and social history Their use of alcohol, tobacco or illicit drugs Their current medications and supplements The patient's functional ability  including ADLs,fall risks, home safety risks, cognitive, and hearing and visual impairment Diet and physical activities Evidence for depression or mood disorders  The patient's weight, height and BMI have been recorded in the chart.  I have made referrals, counseling, and provided education to the patient based on review of the above and I have provided the patient with a written personalized care plan for preventive services.

## 2019-05-08 NOTE — Patient Instructions (Addendum)
HEALTH MAINTENANCE RECOMMENDATIONS:  It is recommended that you get at least 30 minutes of aerobic exercise at least 5 days/week (for weight loss, you may need as much as 60-90 minutes). This can be any activity that gets your heart rate up. This can be divided in 10-15 minute intervals if needed, but try and build up your endurance at least once a week.  Weight bearing exercise is also recommended twice weekly.  Eat a healthy diet with lots of vegetables, fruits and fiber.  "Colorful" foods have a lot of vitamins (ie green vegetables, tomatoes, red peppers, etc).  Limit sweet tea, regular sodas and alcoholic beverages, all of which has a lot of calories and sugar.  Up to 1 alcoholic drink daily may be beneficial for women (unless trying to lose weight, watch sugars).  Drink a lot of water.  Calcium recommendations are 1200-1500 mg daily (1500 mg for postmenopausal women or women without ovaries), and vitamin D 1000 IU daily.  This should be obtained from diet and/or supplements (vitamins), and calcium should not be taken all at once, but in divided doses.  Monthly self breast exams and yearly mammograms for women over the age of 39 is recommended.  Sunscreen of at least SPF 30 should be used on all sun-exposed parts of the skin when outside between the hours of 10 am and 4 pm (not just when at beach or pool, but even with exercise, golf, tennis, and yard work!)  Use a sunscreen that says "broad spectrum" so it covers both UVA and UVB rays, and make sure to reapply every 1-2 hours.  Remember to change the batteries in your smoke detectors when changing your clock times in the spring and fall.  Use your seat belt every time you are in a car, and please drive safely and not be distracted with cell phones and texting while driving.   Monique James , Thank you for taking time to come for your Medicare Wellness Visit. I appreciate your ongoing commitment to your health goals. Please review the  following plan we discussed and let me know if I can assist you in the future.    This is a list of the screening recommended for you and due dates:  Health Maintenance  Topic Date Due  . Pneumonia vaccines (2 of 2 - PPSV23) 05/07/2020*  . DEXA scan (bone density measurement)  Completed  *Topic was postponed. The date shown is not the original due date.   Bone density test is recommended to be repeated (there was some thinning noted 5 years ago). It was last done at the [place] [facility].  I put in a new order.  Please call there to schedule at your convenience.  Pneumovax and Shingrix vaccines are recommended, as we discussed.  Pneumovax can be given at the pharmacy or our office (but make sure they give pneumovax and NOT prevnar, if done at the pharmacy); Shingrix is covered by Medicare Part D and can only be given at the pharmacy (not covered if given at our office).  Restart tracking your food intake and exercise, with weekly weights, as if you were doing Weight Watchers.  Included in that should be significantly cutting back on the alcohol intake (preferably limiting to just 1-2 drinks/week, to aid with weight loss; never more than 1 per night).  Try and come up with a different evening routine (herbal tea, other relaxing measures).  Increase your vitamin D intake over the winter months (to 3000-4000  IU); okay to continue 2000 IU when getting some sun exposure.  Please get your living will and healthcare power of attorney notarized and get a copy to [place] at your convenience.

## 2019-05-18 ENCOUNTER — Other Ambulatory Visit: Payer: Self-pay

## 2019-05-18 ENCOUNTER — Ambulatory Visit (HOSPITAL_COMMUNITY)
Admission: RE | Admit: 2019-05-18 | Discharge: 2019-05-18 | Disposition: A | Discharge status: Home / Self Care | Payer: Medicare Other | Source: Ambulatory Visit | Attending: Family Medicine | Admitting: Family Medicine

## 2019-05-18 DIAGNOSIS — M8580 Other specified disorders of bone density and structure, unspecified site: Secondary | ICD-10-CM | POA: Diagnosis present

## 2019-05-18 DIAGNOSIS — Z780 Asymptomatic menopausal state: Secondary | ICD-10-CM | POA: Diagnosis not present

## 2019-05-18 DIAGNOSIS — M8589 Other specified disorders of bone density and structure, multiple sites: Secondary | ICD-10-CM | POA: Diagnosis not present

## 2019-06-07 ENCOUNTER — Encounter: Payer: Self-pay | Admitting: Internal Medicine

## 2019-06-22 ENCOUNTER — Ambulatory Visit (INDEPENDENT_AMBULATORY_CARE_PROVIDER_SITE_OTHER): Payer: Medicare Other | Admitting: *Deleted

## 2019-06-22 DIAGNOSIS — T63441D Toxic effect of venom of bees, accidental (unintentional), subsequent encounter: Secondary | ICD-10-CM | POA: Diagnosis not present

## 2019-07-02 ENCOUNTER — Other Ambulatory Visit: Payer: Self-pay | Admitting: Cardiology

## 2019-07-11 ENCOUNTER — Other Ambulatory Visit: Payer: Self-pay | Admitting: Family Medicine

## 2019-07-11 DIAGNOSIS — F411 Generalized anxiety disorder: Secondary | ICD-10-CM

## 2019-07-12 ENCOUNTER — Other Ambulatory Visit: Payer: Self-pay | Admitting: Family Medicine

## 2019-07-12 DIAGNOSIS — F411 Generalized anxiety disorder: Secondary | ICD-10-CM

## 2019-07-18 ENCOUNTER — Encounter: Payer: Self-pay | Admitting: Cardiology

## 2019-07-18 ENCOUNTER — Ambulatory Visit (INDEPENDENT_AMBULATORY_CARE_PROVIDER_SITE_OTHER): Payer: Medicare Other | Admitting: Cardiology

## 2019-07-18 ENCOUNTER — Other Ambulatory Visit: Payer: Self-pay

## 2019-07-18 VITALS — BP 150/64 | HR 82 | Temp 96.8°F | Ht 64.0 in | Wt 214.0 lb

## 2019-07-18 DIAGNOSIS — I4891 Unspecified atrial fibrillation: Secondary | ICD-10-CM

## 2019-07-18 DIAGNOSIS — I10 Essential (primary) hypertension: Secondary | ICD-10-CM

## 2019-07-18 MED ORDER — APIXABAN 5 MG PO TABS: 5.0000 mg | ORAL_TABLET | Freq: Two times a day (BID) | ORAL | 11 refills | Status: DC

## 2019-07-18 NOTE — Progress Notes (Signed)
Clinical Summary Monique James is a 78 y.o.female seen today for follow up of the following medical problems.   1. Afib - no recent palpitations - no bleeding on eliquis.    2. HTN - home bp's 120s/80s normally.       SHhas 2 dogs. A 12 year old boxer who passed recently, still with a mixed breeddog10 yo and just got a new dog.  Goes to water aerobics 3 times a week   Past Medical History:  Diagnosis Date  . Anxiety   . Atrial fibrillation with rapid ventricular response ([place]) 11/05/2014   New dx  . 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 Shaune Leeks)  . Colon polyps    Dr. Oletta Lamas  . Colon polyps    path was small leiomyoma; tubular adenoma 12/2017  . Constipation   . Gout    history of (1985ish)  . Hemorrhoids   . History of IBS   . Hx of pelvic mass    complex, benign pathology  . Hyperlipidemia    elevated trigs and LDL  . Hypertension   . Hyponatremia    mild  . Osteopenia    mild (T-1.3 in 01/2014)  . Shingles 02/2015     Allergies  Allergen Reactions  . Bee Venom Anaphylaxis  . Epinephrine Other (See Comments)    High pulse rate.  . Sulfa Antibiotics Nausea And Vomiting  . Tetanus Toxoids Swelling and Other (See Comments)    Entire arm swelled  . Vancomycin Nausea And Vomiting  . Adhesive [Tape] Rash and Other (See Comments)    Her skin peels off.  . Influenza Vaccine Live Swelling and Rash  . Ivp Dye [Iodinated Diagnostic Agents] Rash  . Zyloprim [Allopurinol] Rash     Current Outpatient Medications  Medication Sig Dispense Refill  . acetaminophen (TYLENOL) 325 MG tablet Take 650 mg by mouth every 6 (six) hours as needed for moderate pain or headache.    . Cholecalciferol (VITAMIN D3) 2000 units TABS Take 2,000 Units by mouth daily.     . citalopram (CELEXA) 20 MG tablet Take 1 tablet by mouth once daily 90 tablet 0  . diltiazem (CARTIA XT) 180 MG 24 hr capsule TAKE 1 CAPSULE BY MOUTH ONCE DAILY   **MEDICATION TO CONTROL YOUR  HEART RATE** 90 capsule 1  . diphenhydrAMINE (BENADRYL) 25 MG tablet Take 25 mg by mouth every 6 (six) hours as needed for itching or allergies (ALLERGY VACCINE).     [name] Kitchen ELIQUIS 5 MG TABS tablet TAKE 1 TABLET BY MOUTH TWICE DAILY 60 tablet 11  . fexofenadine (ALLEGRA) 180 MG tablet Take 180 mg by mouth daily as needed for allergies (ALLERGY VACINE).     [name] Kitchen losartan (COZAAR) 25 MG tablet Take 1 tablet by mouth once daily 90 tablet 3  . Multiple Vitamins-Minerals (MULTIVITAMIN WITH MINERALS) tablet Take 1 tablet by mouth daily.       No current facility-administered medications for this visit.      Past Surgical History:  Procedure Laterality Date  . ABDOMINAL HYSTERECTOMY  1970   for perforated uterus from IUD  . BILATERAL SALPINGOOPHORECTOMY  2005   benign tumor/cyst (Dr. Aldean Ast)  . COLONOSCOPY N/A 12/31/2017   Procedure: COLONOSCOPY;  Surgeon: Danie Binder, MD;  [facility];  Service: Endoscopy;  Laterality: N/A;  10:30am  . COLONOSCOPY N/A 05/23/2018   Procedure: COLONOSCOPY;  Surgeon: Danie Binder, MD;  [facility];  Service: Endoscopy;  Laterality: N/A;  8:30am  . POLYPECTOMY  05/23/2018   Procedure: POLYPECTOMY;  Surgeon: Danie Binder, MD;  Location[facility];  Service: Endoscopy;;  ascending,hepatic, sigmoid,transverse     Allergies  Allergen Reactions  . Bee Venom Anaphylaxis  . Epinephrine Other (See Comments)    High pulse rate.  . Sulfa Antibiotics Nausea And Vomiting  . Tetanus Toxoids Swelling and Other (See Comments)    Entire arm swelled  . Vancomycin Nausea And Vomiting  . Adhesive [Tape] Rash and Other (See Comments)    Her skin peels off.  . Influenza Vaccine Live Swelling and Rash  . Ivp Dye [Iodinated Diagnostic Agents] Rash  . Zyloprim [Allopurinol] Rash      Family History  Problem Relation Age of Onset  . Hypertension Mother   . Heart disease Mother        MI  . Dementia Mother   . Cancer Father         stomach  . Cancer Brother        multiple myeloma  . Healthy Daughter   . Healthy Daughter   . Healthy Daughter   . Diabetes Neg Hx   . Breast cancer Neg Hx   . Colon cancer Neg Hx      Social History Monique James reports that she has never smoked. She has never used smokeless tobacco. Monique James reports current alcohol use of about 14.0 standard drinks of alcohol per week.   Review of Systems CONSTITUTIONAL: No weight loss, fever, chills, weakness or fatigue.  HEENT: Eyes: No visual loss, blurred vision, double vision or yellow sclerae.No hearing loss, sneezing, congestion, runny nose or sore throat.  SKIN: No rash or itching.  CARDIOVASCULAR: per hpi RESPIRATORY: No shortness of breath, cough or sputum.  GASTROINTESTINAL: No anorexia, nausea, vomiting or diarrhea. No abdominal pain or blood.  GENITOURINARY: No burning on urination, no polyuria NEUROLOGICAL: No headache, dizziness, syncope, paralysis, ataxia, numbness or tingling in the extremities. No change in bowel or bladder control.  MUSCULOSKELETAL: No muscle, back pain, joint pain or stiffness.  LYMPHATICS: No enlarged nodes. No history of splenectomy.  PSYCHIATRIC: No history of depression or anxiety.  ENDOCRINOLOGIC: No reports of sweating, cold or heat intolerance. No polyuria or polydipsia.  [name] Kitchen   Physical Examination Today's Vitals   07/18/19 0855  BP: (!) 150/64  Pulse: 82  Temp: (!) 96.8 F (36 C)  SpO2: 95%  Weight: 214 lb (97.1 kg)  Height: '5\' 4"'  (1.626 m)   Body mass index is 36.73 kg/m.  Gen: resting comfortably, no acute distress HEENT: no scleral icterus, pupils equal round and reactive, no palptable cervical adenopathy,  CV: RRR, no mr/g, no jvd Resp: Clear to auscultation bilaterally GI: abdomen is soft, non-tender, non-distended, normal bowel sounds, no hepatosplenomegaly MSK: extremities are warm, no edema.  Skin: warm, no rash Neuro:  no focal deficits Psych: appropriate affect    Diagnostic Studies Jan 2016 echo Study Conclusions  - Left ventricle: The cavity size was normal. Wall thickness was increased in a pattern of moderate LVH. Systolic function was vigorous. The estimated ejection fraction was in the range of 65% to 70%. Diastolic function is abnormal, indeterminant grade. Wall motion was normal; there were no regional wall motion abnormalities. - Aortic valve: Mildly calcified annulus. Trileaflet; mildly thickened leaflets. - Mitral valve: Mildly calcified annulus. Mildly thickened leaflets    Assessment and Plan  1. Afib - no recent symptoms, continue current meds including  eliquis for stroke prevention - EKG today shows NSR    2. HTN - elevated in clinic but home numbers at goal, continue current meds    F/u 1 year  Arnoldo Lenis, M.D.

## 2019-07-18 NOTE — Patient Instructions (Signed)

## 2019-08-07 ENCOUNTER — Other Ambulatory Visit: Payer: Self-pay | Admitting: Cardiology

## 2019-08-07 ENCOUNTER — Ambulatory Visit: Payer: Medicare Other | Admitting: Allergy and Immunology

## 2019-08-07 ENCOUNTER — Encounter: Payer: Self-pay | Admitting: Allergy and Immunology

## 2019-08-07 ENCOUNTER — Other Ambulatory Visit: Payer: Self-pay

## 2019-08-07 DIAGNOSIS — Z91038 Other insect allergy status: Secondary | ICD-10-CM

## 2019-08-07 MED ORDER — EPINEPHRINE 0.3 MG/0.3ML IJ SOAJ: 0.3000 mg | Freq: Once | INTRAMUSCULAR | 1 refills | Status: AC

## 2019-08-07 NOTE — Progress Notes (Signed)
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  She was last seen in this clinic in September 2019.  She reports that on July 29, 2019 she was stung on the finger by wasp but only developed mild localized erythema and swelling without systemic symptoms.  Based on the severe anaphylactic reaction she had years ago after a sting, she does not want to discuss discontinuing venom immunotherapy.  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    Emergency allergy action plan is in place.  A refill prescription has been provided for epinephrine 0.3 mg autoinjector 2 pack along with instructions for its proper administration.   Meds ordered this encounter  Medications  . EPINEPHrine 0.3 mg/0.3 mL IJ SOAJ injection    Sig: Inject 0.3 mLs (0.3 mg total) into the muscle once for 1 dose. As needed for life-threatening allergic reactions    Dispense:  2 each    Refill:  1    Physical examination: Blood pressure (!) 156/84, pulse (!) 101, temperature 98.1 F (36.7 C), temperature source Temporal, resp. rate 16, height 5\' 2"  (1.575 m), weight 215 lb 6.4 oz (97.7 kg), SpO2 94 %.  General: Alert, interactive, in no acute distress. Neck: Supple without lymphadenopathy. Lungs: Clear to auscultation without wheezing, rhonchi or rales. CV: Normal S1, S2 without murmurs. Skin: Warm and dry, without lesions or rashes.  The following portions of the patient's history were reviewed and updated as appropriate: allergies, current medications, past family history, past medical history, past  social history, past surgical history and problem list.  Current Outpatient Medications  Medication Sig Dispense Refill  . acetaminophen (TYLENOL) 325 MG tablet Take 650 mg by mouth every 6 (six) hours as needed for moderate pain or headache.    [name] Kitchen apixaban (ELIQUIS) 5 MG TABS tablet Take 1 tablet (5 mg total) by mouth 2 (two) times daily. 60 tablet 11  . Cholecalciferol (VITAMIN D3) 2000 units TABS Take 2,000 Units by mouth daily.     . citalopram (CELEXA) 20 MG tablet Take 1 tablet by mouth once daily 90 tablet 0  . diphenhydrAMINE (BENADRYL) 25 MG tablet Take 25 mg by mouth every 6 (six) hours as needed for itching or allergies (ALLERGY VACCINE).     [name] Kitchen fexofenadine (ALLEGRA) 180 MG tablet Take 180 mg by mouth daily as needed for allergies (ALLERGY VACINE).     [name] Kitchen losartan (COZAAR) 25 MG tablet Take 1 tablet by mouth once daily 90 tablet 3  . Multiple Vitamins-Minerals (MULTIVITAMIN WITH MINERALS) tablet Take 1 tablet by mouth daily.      [name] Kitchen diltiazem (CARTIA XT) 180 MG 24 hr capsule TAKE 1 CAPSULE BY MOUTH ONCE DAILY **MEDICATIOM  TO  CONTROL  HEART  RATE** 90 capsule 1  . EPINEPHrine 0.3 mg/0.3 mL IJ SOAJ injection Inject 0.3 mLs (0.3 mg total) into the muscle once for 1 dose. As needed for life-threatening allergic reactions 2 each 1   No current facility-administered medications for this visit.     Allergies  Allergen Reactions  . Bee Venom Anaphylaxis  . Epinephrine Other (See Comments)    High pulse  rate.  . Sulfa Antibiotics Nausea And Vomiting  . Tetanus Toxoids Swelling and Other (See Comments)    Entire arm swelled  . Vancomycin Nausea And Vomiting  . Adhesive [Tape] Rash and Other (See Comments)    Her skin peels off.  . Influenza Vaccine Live Swelling and Rash  . Ivp Dye [Iodinated Diagnostic Agents] Rash  . Zyloprim [Allopurinol] Rash    I appreciate the opportunity to take part in Odalis's care. Please do not hesitate to contact me with questions.  Sincerely,   R.  Edgar Frisk, MD

## 2019-08-07 NOTE — Patient Instructions (Addendum)
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    Emergency allergy action plan is in place.  A refill prescription has been provided for epinephrine 0.3 mg autoinjector 2 pack along with instructions for its proper administration.   Return in about 1 year (around 08/06/2020), or if symptoms worsen or fail to improve.

## 2019-08-07 NOTE — Assessment & Plan Note (Signed)
   Continue careful avoidance of flying insects and have access to epinephrine autoinjector 2 pack in case of systemic symptoms associated with a sting.    Emergency allergy action plan is in place.  A refill prescription has been provided for epinephrine 0.3 mg autoinjector 2 pack along with instructions for its proper administration.

## 2019-08-17 ENCOUNTER — Other Ambulatory Visit: Payer: Self-pay

## 2019-08-17 ENCOUNTER — Ambulatory Visit (INDEPENDENT_AMBULATORY_CARE_PROVIDER_SITE_OTHER): Payer: Medicare Other

## 2019-08-17 DIAGNOSIS — Z91038 Other insect allergy status: Secondary | ICD-10-CM

## 2019-09-11 ENCOUNTER — Other Ambulatory Visit (HOSPITAL_COMMUNITY): Payer: Self-pay | Admitting: Family Medicine

## 2019-09-11 DIAGNOSIS — Z1231 Encounter for screening mammogram for malignant neoplasm of breast: Secondary | ICD-10-CM

## 2019-09-29 DIAGNOSIS — R000 Tachycardia, unspecified: Secondary | ICD-10-CM | POA: Diagnosis not present

## 2019-09-29 DIAGNOSIS — M5489 Other dorsalgia: Secondary | ICD-10-CM | POA: Diagnosis not present

## 2019-09-29 DIAGNOSIS — I10 Essential (primary) hypertension: Secondary | ICD-10-CM | POA: Diagnosis not present

## 2019-09-29 DIAGNOSIS — Z743 Need for continuous supervision: Secondary | ICD-10-CM | POA: Diagnosis not present

## 2019-09-29 DIAGNOSIS — M25551 Pain in right hip: Secondary | ICD-10-CM | POA: Diagnosis not present

## 2019-09-29 DIAGNOSIS — M5431 Sciatica, right side: Secondary | ICD-10-CM | POA: Diagnosis not present

## 2019-09-30 ENCOUNTER — Other Ambulatory Visit: Payer: Self-pay

## 2019-09-30 ENCOUNTER — Emergency Department (HOSPITAL_COMMUNITY)
Admission: EM | Admit: 2019-09-30 | Discharge: 2019-09-30 | Disposition: A | Discharge status: Home / Self Care | Payer: Medicare Other | Attending: Emergency Medicine | Admitting: Emergency Medicine

## 2019-09-30 ENCOUNTER — Encounter (HOSPITAL_COMMUNITY): Payer: Self-pay | Admitting: *Deleted

## 2019-09-30 DIAGNOSIS — M5431 Sciatica, right side: Secondary | ICD-10-CM

## 2019-09-30 DIAGNOSIS — Z79899 Other long term (current) drug therapy: Secondary | ICD-10-CM | POA: Diagnosis not present

## 2019-09-30 DIAGNOSIS — I10 Essential (primary) hypertension: Secondary | ICD-10-CM | POA: Insufficient documentation

## 2019-09-30 DIAGNOSIS — Z7901 Long term (current) use of anticoagulants: Secondary | ICD-10-CM | POA: Insufficient documentation

## 2019-09-30 DIAGNOSIS — M25551 Pain in right hip: Secondary | ICD-10-CM | POA: Diagnosis present

## 2019-09-30 DIAGNOSIS — M5441 Lumbago with sciatica, right side: Secondary | ICD-10-CM | POA: Insufficient documentation

## 2019-09-30 MED ORDER — PREDNISONE 10 MG PO TABS: 20.0000 mg | ORAL_TABLET | Freq: Two times a day (BID) | ORAL | 0 refills | Status: DC

## 2019-09-30 MED ORDER — PREDNISONE 50 MG PO TABS
60.0000 mg | ORAL_TABLET | Freq: Once | ORAL | Status: AC
  Administered 2019-09-30: 02:00:00 60 mg via ORAL
  Filled 2019-09-30: qty 1

## 2019-09-30 MED ORDER — HYDROCODONE-ACETAMINOPHEN 5-325 MG PO TABS: 1.0000 | ORAL_TABLET | Freq: Four times a day (QID) | ORAL | 0 refills | Status: DC | PRN

## 2019-09-30 MED ORDER — KETOROLAC TROMETHAMINE 60 MG/2ML IM SOLN
30.0000 mg | Freq: Once | INTRAMUSCULAR | Status: AC
  Administered 2019-09-30: 30 mg via INTRAMUSCULAR
  Filled 2019-09-30: qty 2

## 2019-09-30 MED ORDER — OXYCODONE-ACETAMINOPHEN 5-325 MG PO TABS
2.0000 | ORAL_TABLET | Freq: Once | ORAL | Status: AC
  Administered 2019-09-30: 2 via ORAL
  Filled 2019-09-30: qty 2

## 2019-09-30 NOTE — Discharge Instructions (Addendum)
Prednisone as prescribed.  Begin taking Percocet as prescribed as needed for pain.  Follow-up with primary doctor if not improving in the next week, and return to the ER if symptoms significantly worsen or change.

## 2019-09-30 NOTE — ED Triage Notes (Signed)
Pt c/o right hip pain that started two days ago, has hx of sciatica, denies any injury,

## 2019-09-30 NOTE — ED Provider Notes (Signed)
[facility] EMERGENCY DEPARTMENT Provider Note   CSN: [id number] Arrival date & time: 09/30/19  0016     History   Chief Complaint Chief Complaint  Patient presents with  . Hip Pain    HPI Monique James is a 78 y.o. female.     Patient is a 78 year old female with history of atrial fibrillation, irritable bowel, hypertension, hyperlipidemia, and recurrent sciatica.  She presents today with complaints of severe pain in her right buttock radiating down to the back of her leg.  This feels similar to prior flareups of sciatica.  She denies any specific injury or trauma.  She denies any bowel or bladder complaints.  She denies any weakness or numbness in her leg.  The history is provided by the patient.  Hip Pain This is a new problem. The current episode started 3 to 5 hours ago. The problem occurs constantly. The problem has been rapidly worsening. Pertinent negatives include no abdominal pain. The symptoms are aggravated by walking (movement and palpation).    Past Medical History:  Diagnosis Date  . Anxiety   . Atrial fibrillation with rapid ventricular response ([place]) 11/05/2014   New dx  . 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 Shaune Leeks)  . Colon polyps    Dr. Oletta Lamas  . Colon polyps    path was small leiomyoma; tubular adenoma 12/2017  . Constipation   . Gout    history of (1985ish)  . Hemorrhoids   . History of IBS   . Hx of pelvic mass    complex, benign pathology  . Hyperlipidemia    elevated trigs and LDL  . Hypertension   . Hyponatremia    mild  . Osteopenia    mild (T-1.3 in 01/2014)  . Shingles 02/2015    Patient Active Problem List   Diagnosis Date Noted  . Encounter for screening colonoscopy   . History of colonic polyps 12/01/2017  . Taking medication for chronic disease 12/01/2017  . Generalized anxiety disorder 08/26/2015  . 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 Atrial fibrillation with rapid ventricular response ([place]) 11/05/2014  .  Hypertension 11/05/2014  . Obesity 11/05/2014  . A-fib ([place]) 11/05/2014  . Obesity (BMI 30-39.9) 02/08/2014  . Pure hypercholesterolemia 02/02/2013  . Essential hypertension, benign 07/30/2011  . Hyponatremia 07/30/2011    Past Surgical History:  Procedure Laterality Date  . ABDOMINAL HYSTERECTOMY  1970   for perforated uterus from IUD  . BILATERAL SALPINGOOPHORECTOMY  2005   benign tumor/cyst (Dr. Aldean Ast)  . COLONOSCOPY N/A 12/31/2017   Procedure: COLONOSCOPY;  Surgeon: Danie Binder, MD;  [facility];  Service: Endoscopy;  Laterality: N/A;  10:30am  . COLONOSCOPY N/A 05/23/2018   Procedure: COLONOSCOPY;  Surgeon: Danie Binder, MD;  [facility];  Service: Endoscopy;  Laterality: N/A;  8:30am  . POLYPECTOMY  05/23/2018   Procedure: POLYPECTOMY;  Surgeon: Danie Binder, MD;  [facility];  Service: Endoscopy;;  ascending,hepatic, sigmoid,transverse     OB History    Gravida  4   Para  3   Term      Preterm      AB  1   Living  3     SAB  1   TAB      Ectopic      Multiple      Live Births           Obstetric Comments  1 set  of twins; 3 pregnancies, 1 miscarriage One of the twins wasn't viable; 3 living children         Home Medications    Prior to Admission medications   Medication Sig Start Date End Date Taking? Authorizing Provider  acetaminophen (TYLENOL) 325 MG tablet Take 650 mg by mouth every 6 (six) hours as needed for moderate pain or headache.    [provider]  apixaban (ELIQUIS) 5 MG TABS tablet Take 1 tablet (5 mg total) by mouth 2 (two) times daily. 07/18/19   Arnoldo Lenis, MD  Cholecalciferol (VITAMIN D3) 2000 units TABS Take 2,000 Units by mouth daily.     [provider]  citalopram (CELEXA) 20 MG tablet Take 1 tablet by mouth once daily 07/12/19   Rita Ohara, MD  diltiazem (CARTIA XT) 180 MG 24 hr capsule TAKE 1 CAPSULE BY MOUTH ONCE DAILY **MEDICATIOM  TO  CONTROL   HEART  RATE** 08/07/19   Arnoldo Lenis, MD  diphenhydrAMINE (BENADRYL) 25 MG tablet Take 25 mg by mouth every 6 (six) hours as needed for itching or allergies (ALLERGY VACCINE).     [provider]  fexofenadine (ALLEGRA) 180 MG tablet Take 180 mg by mouth daily as needed for allergies (ALLERGY VACINE).     [provider]  losartan (COZAAR) 25 MG tablet Take 1 tablet by mouth once daily 07/03/19   Arnoldo Lenis, MD  Multiple Vitamins-Minerals (MULTIVITAMIN WITH MINERALS) tablet Take 1 tablet by mouth daily.      [provider]    Family History Family History  Problem Relation Age of Onset  . Hypertension Mother   . Heart disease Mother        MI  . Dementia Mother   . Cancer Father        stomach  . Cancer Brother        multiple myeloma  . Healthy Daughter   . Healthy Daughter   . Healthy Daughter   . Diabetes Neg Hx   . Breast cancer Neg Hx   . Colon cancer Neg Hx     Social History Social History   Tobacco Use  . Smoking status: Never Smoker  . Smokeless tobacco: Never Used  Substance Use Topics  . Alcohol use: Yes    Alcohol/week: 14.0 standard drinks    Types: 14 Glasses of wine per week    Comment: 1 glass of wine daily, occasionally 2 (2-3x/week having two, over the last few months)  . Drug use: No     Allergies   Bee venom, Epinephrine, Sulfa antibiotics, Tetanus toxoids, Vancomycin, Adhesive [tape], Influenza vaccine live, Ivp dye [iodinated diagnostic agents], and Zyloprim [allopurinol]   Review of Systems Review of Systems  Gastrointestinal: Negative for abdominal pain.  All other systems reviewed and are negative.    Physical Exam Updated Vital Signs BP (!) 165/77   Pulse (!) 113   Temp 98 F (36.7 C)   Resp 20   Ht 5' 4" (1.626 m)   Wt 93.9 kg   SpO2 96%   BMI 35.53 kg/m   Physical Exam Vitals signs and nursing note reviewed.  Constitutional:      General: She is not in acute distress.     Appearance: She is well-developed. She is not diaphoretic.  HENT:     Head: Normocephalic and atraumatic.  Pulmonary:     Effort: Pulmonary effort is normal. No respiratory distress.  Musculoskeletal: Normal range of motion.  Comments: There is ttp in the right buttock.  DP and PT pulses are palpable bilaterally.  Strength is 5/5 in both lower extremities and sensation is intact to both lower extremities.  Skin:    General: Skin is warm and dry.  Neurological:     Mental Status: She is alert and oriented to person, place, and time.      ED Treatments / Results  Labs (all labs ordered are listed, but only abnormal results are displayed) Labs Reviewed - No data to display  EKG None  Radiology No results found.  Procedures Procedures (including critical care time)  Medications Ordered in ED Medications  predniSONE (DELTASONE) tablet 60 mg (has no administration in time range)  oxyCODONE-acetaminophen (PERCOCET/ROXICET) 5-325 MG per tablet 2 tablet (has no administration in time range)  ketorolac (TORADOL) injection 30 mg (has no administration in time range)     Initial Impression / Assessment and Plan / ED Course  I have reviewed the triage vital signs and the nursing notes.  Pertinent labs & imaging results that were available during my care of the patient were reviewed by me and considered in my medical decision making (see chart for details).  Patient presenting here with complaints in her right buttock radiating down her right leg.  Symptoms most consistent with sciatica.  Patient states she has had this in the past, however never quite this bad.  Patient was given Percocet, prednisone, and Toradol and is now feeling significantly improved.  She is able to ambulate to the bathroom with only what she describes as "twinges of pain".  She will be discharged with prednisone and pain medication and as needed follow-up with her primary doctor.  Final Clinical Impressions(s)  / ED Diagnoses   Final diagnoses:  None    ED Discharge Orders    None       Delo, Douglas, MD 09/30/19 0245  

## 2019-10-10 ENCOUNTER — Other Ambulatory Visit: Payer: Self-pay | Admitting: Family Medicine

## 2019-10-10 DIAGNOSIS — F411 Generalized anxiety disorder: Secondary | ICD-10-CM

## 2019-10-10 NOTE — Telephone Encounter (Signed)
Is this ok to refill? Pt has an appt in January

## 2019-10-12 ENCOUNTER — Ambulatory Visit (INDEPENDENT_AMBULATORY_CARE_PROVIDER_SITE_OTHER): Payer: Medicare Other

## 2019-10-12 ENCOUNTER — Other Ambulatory Visit: Payer: Self-pay

## 2019-10-12 ENCOUNTER — Other Ambulatory Visit: Payer: Self-pay | Admitting: Family Medicine

## 2019-10-12 DIAGNOSIS — T63441D Toxic effect of venom of bees, accidental (unintentional), subsequent encounter: Secondary | ICD-10-CM

## 2019-10-12 DIAGNOSIS — F411 Generalized anxiety disorder: Secondary | ICD-10-CM

## 2019-10-18 ENCOUNTER — Ambulatory Visit (HOSPITAL_COMMUNITY): Payer: Medicare Other

## 2019-11-06 ENCOUNTER — Ambulatory Visit (HOSPITAL_COMMUNITY)
Admission: RE | Admit: 2019-11-06 | Discharge: 2019-11-06 | Disposition: A | Discharge status: Home / Self Care | Payer: Medicare Other | Source: Ambulatory Visit | Attending: Family Medicine | Admitting: Family Medicine

## 2019-11-06 ENCOUNTER — Other Ambulatory Visit: Payer: Self-pay

## 2019-11-06 DIAGNOSIS — Z1231 Encounter for screening mammogram for malignant neoplasm of breast: Secondary | ICD-10-CM | POA: Diagnosis not present

## 2019-11-12 NOTE — Progress Notes (Signed)
Chief Complaint  Patient presents with  . Anxiety    fasting med check. Unsure if she would like pneumo 23.     Went to ER 09/2019 with R-sided sciatica. She was treated with toradol, prednisone and percocet in ER, sent home with 40mg  BID x 5d prednisone and vicodin #15. Pain has resolved.  Anxiety--related to diagnosis of atrial fibrillation. On citalopram and doing very well. Not needing any alprazolam. Denies side effects to citalopram. She weaned off as a trialin the past,butafter being off it a week, she had increased anxiety regarding her pulse and checking BP's, realized she was more anxious, and restarted it. Her anxiety is better since being back on the medicationand prefers to continue with it.She denies medication side effects.  Atrial fibrillation--diagnosed 11/2014. Last saw cardiologist Dr. Ascencion Dike 07/2019,and no changes were made her her regimen. She continues on Eliquis.She denies chest pain, palpitations, bleeding, bruising. CBC's have all been normal, with no e/o anemia. She cannot tell when she is in afib. Lab Results  Component Value Date   WBC 7.9 05/02/2019   HGB 13.8 05/02/2019   HCT 41.8 05/02/2019   MCV 96 05/02/2019   PLT 348 05/02/2019   Hypertension--currently well controlled on Cartiaand Losartan.She is compliant and denies side effects.  Denies headaches, dizziness, chest pain, palpitations. BP at home recently was 127/70's a couple of weeks ago, checks occasionally, always okay. Pulse is usually in the 80's.  Hyperlipidemia: Controlled by diet, which remains the same.  She tries to limit her cheese intake, avoid creamy dressings. She mostly eats chicken and some pork, infrequent red meat.She has had excellent HDL. Lab Results  Component Value Date   CHOL 242 (H) 05/02/2019   HDL 111 05/02/2019   LDLCALC 119 (H) 05/02/2019   TRIG 61 05/02/2019   CHOLHDL 2.2 05/02/2019   Vitamin D deficiency: Last level was 30.2 in 04/2019.  She is  compliant with taking her daily MVI plus D3 2000 IU daily.    Allergies: Takes Allegra when she goes for her shots, and just seasonally just prn.She continues on allergy shotsevery 8 weeks.  Colon polyps: On colonoscopy 05/2018, 10 polyps were found, one of which was 24mm. Path revealed seven simple adenomas,one serrated adenoma, and 2 hyperplastic polyps. No advanced changes were seen. GI reported that no repeat colonoscopy is needed (but that her family should have colonoscopies starting at the age of 67). She recalls possibly being told repeat in 3 years, but she was groggy and didn't recall exactly what she was told.  Obesity:   Counseled extensively at prior visits.   Previously has done [employer].  She was encouraged to log/journal her food as if she was doing [employer], to hold herself accountable.  She did it for a while, but stopped.  She watches her portions closely (for meat and cheese), tries to limit carbs.  Snacks some. She declined weight loss clinic due to distance.  She doesn't have computer access for virtual visits. She continues to have wine every night, 2 glasses. She is back to the [facility], doing 45 minutes of water aerobics 3x/week.   PMH, PSH, SH reviewed  Outpatient Encounter Medications as of 11/13/2019  Medication Sig Note  . apixaban (ELIQUIS) 5 MG TABS tablet Take 1 tablet (5 mg total) by mouth 2 (two) times daily.   . Cholecalciferol (VITAMIN D3) 2000 units TABS Take 2,000 Units by mouth daily.    . citalopram (CELEXA) 20 MG tablet Take 1 tablet by mouth once  daily   . diltiazem (CARTIA XT) 180 MG 24 hr capsule TAKE 1 CAPSULE BY MOUTH ONCE DAILY **MEDICATIOM  TO  CONTROL  HEART  RATE**   . diphenhydrAMINE (BENADRYL) 25 MG tablet Take 25 mg by mouth every 6 (six) hours as needed for itching or allergies (ALLERGY VACCINE).  11/13/2019: Takes on the day of her allergy shots  . fexofenadine (ALLEGRA) 180 MG tablet Take 180 mg by mouth daily as needed for allergies (ALLERGY  VACINE).  11/13/2019: Takes on the day of her allergy shots  . losartan (COZAAR) 25 MG tablet Take 1 tablet by mouth once daily   . Multiple Vitamins-Minerals (MULTIVITAMIN WITH MINERALS) tablet Take 1 tablet by mouth daily.     [name] Kitchen acetaminophen (TYLENOL) 325 MG tablet Take 650 mg by mouth every 6 (six) hours as needed for moderate pain or headache.   [name] Kitchen HYDROcodone-acetaminophen (NORCO) 5-325 MG tablet Take 1-2 tablets by mouth every 6 (six) hours as needed. (Patient not taking: Reported on 11/13/2019)   . [DISCONTINUED] predniSONE (DELTASONE) 10 MG tablet Take 2 tablets (20 mg total) by mouth 2 (two) times daily.    No facility-administered encounter medications on file as of 11/13/2019.    Allergies  Allergen Reactions  . Bee Venom Anaphylaxis  . Epinephrine Other (See Comments)    High pulse rate.  . Sulfa Antibiotics Nausea And Vomiting  . Tetanus Toxoids Swelling and Other (See Comments)    Entire arm swelled  . Vancomycin Nausea And Vomiting  . Adhesive [Tape] Rash and Other (See Comments)    Her skin peels off.  . Influenza Vaccine Live Swelling and Rash  . Ivp Dye [Iodinated Diagnostic Agents] Rash  . Zyloprim [Allopurinol] Rash    ROS:  Denies fever, chills, URI symptoms, cough, shortness of breath, chest pain, palpitations.  Denies urinary complaints. Rareheartburn (relieved by Tums). Some mild constipation, controlled with high fiber diet and stool softenersprn. Anxiety is well controlled, denies depression. Sciatica resolved.    PHYSICAL EXAM:  BP 136/80   Pulse 92   Temp (!) 96.9 F (36.1 C) (Other (Comment))   Ht 5' 2.5" (1.588 m)   Wt 208 lb (94.3 kg)   BMI 37.44 kg/m   Wt Readings from Last 3 Encounters:  11/13/19 208 lb (94.3 kg)  09/30/19 207 lb (93.9 kg)  08/07/19 215 lb 6.4 oz (97.7 kg)    Well developed, pleasant, obese female in no distress. HEENT: PERRL, EOMI, conjunctiva and sclera are clear.EOMI. Wearing mask due to COVID-19  pandemic. Neck: no lymphadenopathy, thyromegaly or mass, no bruit Heart: regular rate and rhythm, no murmur. Occasional skipped beat Lungs: clear bilaterally Back: no spinal or CVA tenderness Abdomen: obese, soft, nontender Extremities: no edema, normal pulses Psych: normal mood, affect, hygiene and grooming Neuro: alert and oriented, normal gait. Breast exam: no nipple inversion, nipple discharge, skin dimpling or breast masses. No axillary lymphadenopathy.   ASSESSMENT/PLAN:  Essential hypertension - controlled (lower at home than in office; higher today due to stressful commute with traffic lights out)  Atrial fibrillation with rapid ventricular response ([place]) - currently in NSR.  Anticoagulated.  Pure hypercholesterolemia - excellent HDL. Continue low cholesterol diet  Anticoagulant long-term use - no e/o complications  Immunization counseling - counseled at length re: Pneumovax, COVID vaccine, Shingrix. Rec that her daughter bring her for pneumovax (to avoid driving if not feeling well after)  Generalized anxiety disorder - well controlled, cont citalopram  Class 2 severe obesity due to excess calories  with serious comorbidity and body mass index (BMI) of 37.0 to 37.9 in adult [facility]) - counseled at length; encouraged cutting back on wine, proper diet, exercise, journaling/logging food for accountability  Cont current meds; no refills needed today.  F/u 6 months fasting AWV/CPE (fasting)

## 2019-11-13 ENCOUNTER — Ambulatory Visit (INDEPENDENT_AMBULATORY_CARE_PROVIDER_SITE_OTHER): Payer: Medicare Other | Admitting: Family Medicine

## 2019-11-13 ENCOUNTER — Encounter: Payer: Self-pay | Admitting: Family Medicine

## 2019-11-13 ENCOUNTER — Other Ambulatory Visit: Payer: Self-pay

## 2019-11-13 VITALS — BP 136/80 | HR 92 | Temp 96.9°F | Ht 62.5 in | Wt 208.0 lb

## 2019-11-13 DIAGNOSIS — E7800 Pure hypercholesterolemia, unspecified: Secondary | ICD-10-CM

## 2019-11-13 DIAGNOSIS — Z7185 Encounter for immunization safety counseling: Secondary | ICD-10-CM

## 2019-11-13 DIAGNOSIS — I10 Essential (primary) hypertension: Secondary | ICD-10-CM

## 2019-11-13 DIAGNOSIS — I4891 Unspecified atrial fibrillation: Secondary | ICD-10-CM

## 2019-11-13 DIAGNOSIS — Z6837 Body mass index (BMI) 37.0-37.9, adult: Secondary | ICD-10-CM

## 2019-11-13 DIAGNOSIS — Z7901 Long term (current) use of anticoagulants: Secondary | ICD-10-CM

## 2019-11-13 DIAGNOSIS — Z7189 Other specified counseling: Secondary | ICD-10-CM | POA: Diagnosis not present

## 2019-11-13 DIAGNOSIS — F411 Generalized anxiety disorder: Secondary | ICD-10-CM

## 2019-11-13 NOTE — Patient Instructions (Addendum)
Continue current medications. Continue to work on healthy diet, portion control and regular exercise to help with weight loss.  Limit alcohol to 1/day, ideally less in order to help with weight control. (try just on Friday/Saturday rather than daily).  Try indoor exercises on the the days you don't go to the [facility], such as walking in place while watching TV).  I recommend getting the new shingles vaccine (Shingrix). It is covered by Medicare Part D, so you need to get from the pharmacy rather than our office.  It is a series of 2 injections, spaced 2 months apart.  COVID vaccine is recommended.  They will need to monitor you for 30 minutes, given your history of anaphylaxis.  Please follow the news, [email or web address], [place] [facility]HS and [place]'s websites for more information. You qualify to receive one now, and they are scheduling appointments.    I highly encourage you to get the pneumovax vaccine that we discussed today.  I understand your hesitation.  I think having somebody to drive you would be a better idea.  Please talk to your children and set up a nurse visit, when they can drive, just in case you don't feel great after the vaccine.  Consider taking some extra D3 (2000 IU extra dose a few times/week, through the winter months when not getting any sun exposure).

## 2019-12-07 ENCOUNTER — Ambulatory Visit (INDEPENDENT_AMBULATORY_CARE_PROVIDER_SITE_OTHER): Payer: Medicare Other

## 2019-12-07 ENCOUNTER — Other Ambulatory Visit: Payer: Self-pay

## 2019-12-07 DIAGNOSIS — T63441D Toxic effect of venom of bees, accidental (unintentional), subsequent encounter: Secondary | ICD-10-CM

## 2020-02-01 ENCOUNTER — Ambulatory Visit (INDEPENDENT_AMBULATORY_CARE_PROVIDER_SITE_OTHER): Payer: Medicare Other | Admitting: *Deleted

## 2020-02-01 ENCOUNTER — Other Ambulatory Visit: Payer: Self-pay

## 2020-02-01 DIAGNOSIS — T63441D Toxic effect of venom of bees, accidental (unintentional), subsequent encounter: Secondary | ICD-10-CM | POA: Diagnosis not present

## 2020-02-02 ENCOUNTER — Other Ambulatory Visit: Payer: Self-pay | Admitting: Cardiology

## 2020-03-28 ENCOUNTER — Telehealth: Payer: Self-pay

## 2020-03-28 ENCOUNTER — Ambulatory Visit (INDEPENDENT_AMBULATORY_CARE_PROVIDER_SITE_OTHER): Payer: Medicare Other

## 2020-03-28 ENCOUNTER — Other Ambulatory Visit: Payer: Self-pay

## 2020-03-28 DIAGNOSIS — T63441D Toxic effect of venom of bees, accidental (unintentional), subsequent encounter: Secondary | ICD-10-CM | POA: Diagnosis not present

## 2020-03-28 NOTE — Telephone Encounter (Signed)
I called the patient and discussed risk factors, etc.

## 2020-03-28 NOTE — Telephone Encounter (Signed)
I looked at allergy list and did not see any of the components that we question about. I did ask her about the PEG and she said that she does not recall having issues with anything like that. She has had oral steroids with no issues.  Patient is expressing concerns due to her history of reaction with the influenza vaccine 30 years ago. Please advise on further instructions and thank you.

## 2020-04-08 ENCOUNTER — Other Ambulatory Visit: Payer: Self-pay | Admitting: Family Medicine

## 2020-04-08 DIAGNOSIS — D485 Neoplasm of uncertain behavior of skin: Secondary | ICD-10-CM | POA: Diagnosis not present

## 2020-04-08 DIAGNOSIS — F411 Generalized anxiety disorder: Secondary | ICD-10-CM

## 2020-04-08 DIAGNOSIS — Z1283 Encounter for screening for malignant neoplasm of skin: Secondary | ICD-10-CM | POA: Diagnosis not present

## 2020-04-08 DIAGNOSIS — D225 Melanocytic nevi of trunk: Secondary | ICD-10-CM | POA: Diagnosis not present

## 2020-04-08 DIAGNOSIS — D2261 Melanocytic nevi of right upper limb, including shoulder: Secondary | ICD-10-CM | POA: Diagnosis not present

## 2020-04-08 NOTE — Telephone Encounter (Signed)
Pt. Is requesting a refill on her Citalopram last filled 10/12/19 pt. Last apt was 11/13/19 next apt is 05/22/20.

## 2020-05-13 IMAGING — MG DIGITAL SCREENING BILAT W/ TOMO W/ CAD
6 of 10 series · 6 of 30 positions shown · non-contrast
Comparison: Previous exam(s).

CLINICAL DATA: Screening.

EXAM:
DIGITAL SCREENING BILATERAL MAMMOGRAM WITH TOMO AND CAD

[L CC synth-2D]
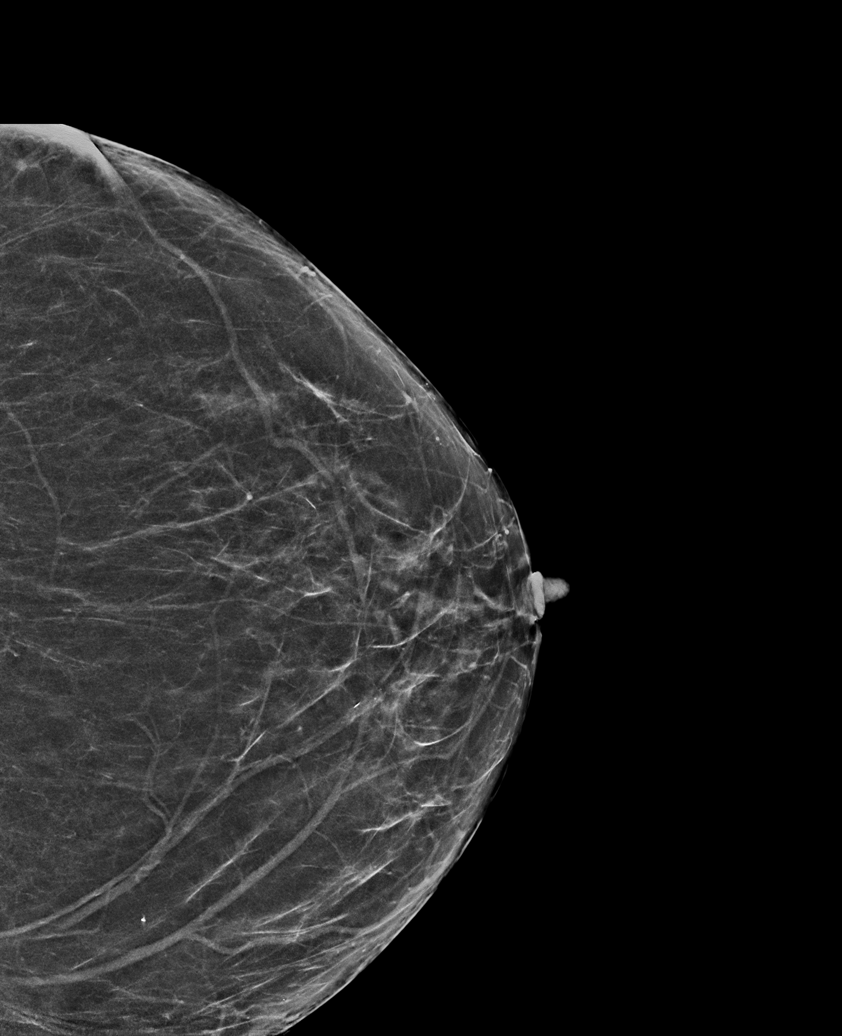

[R MLO synth-2D]
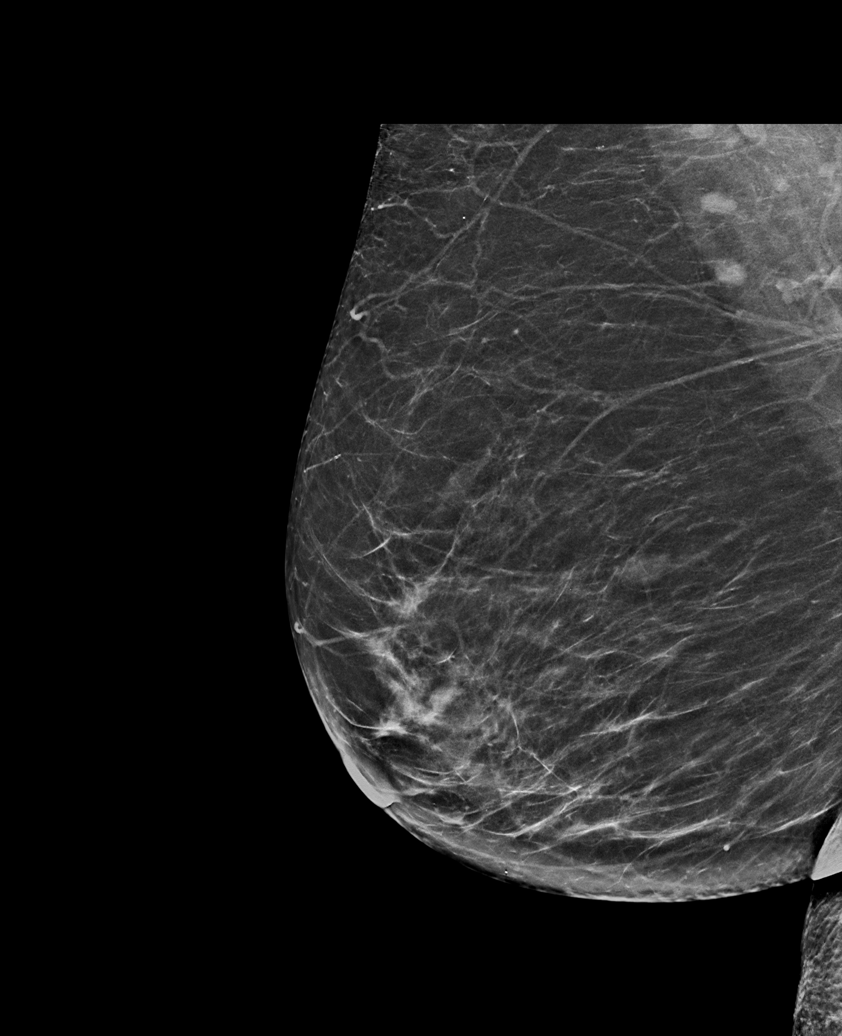

[R CC synth-2D]
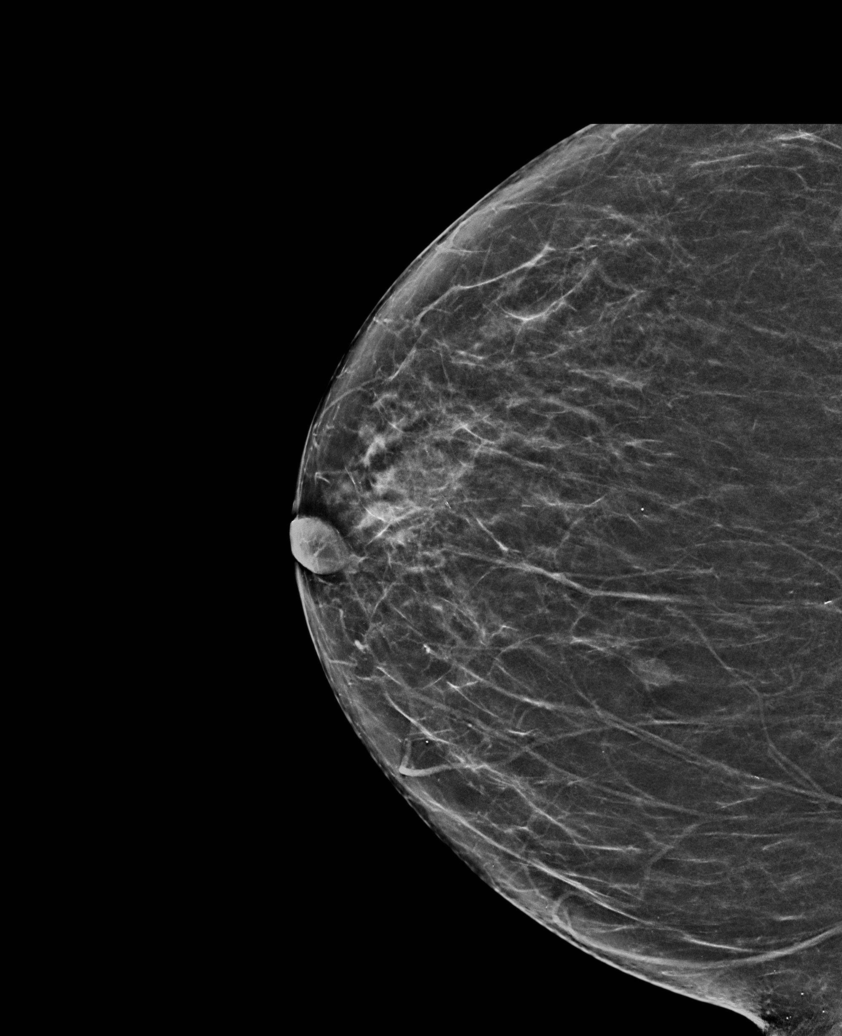

[L CV synth-2D]
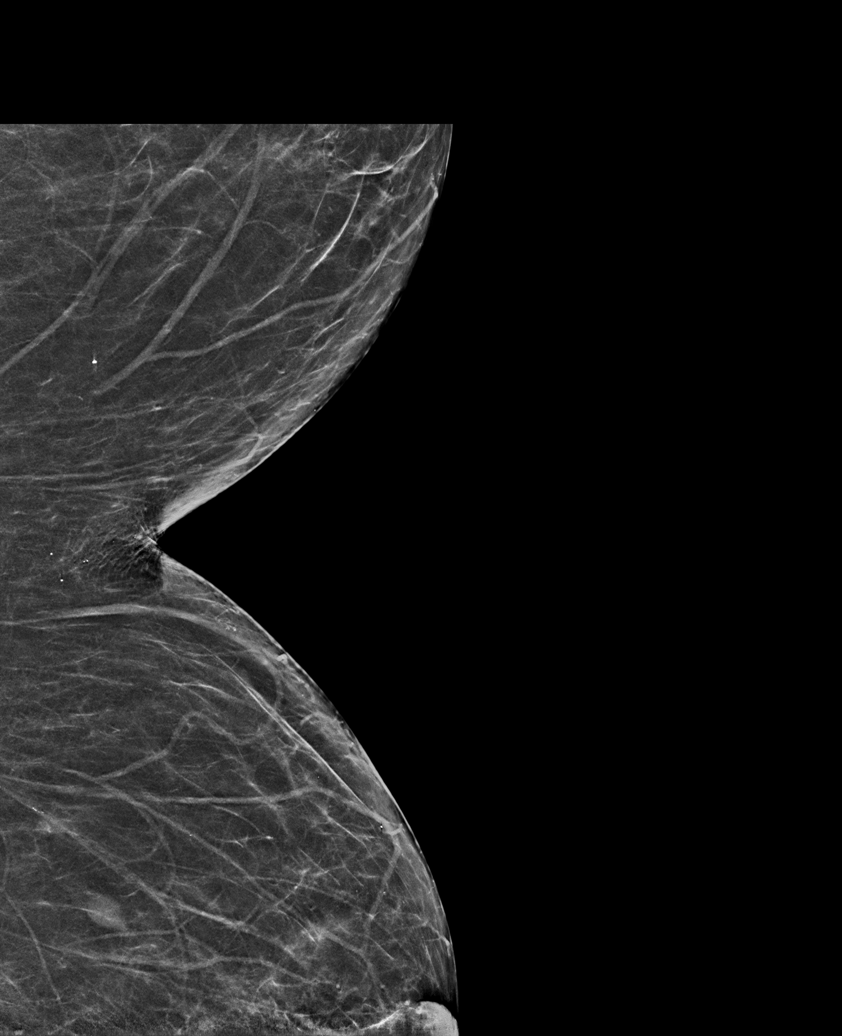

[L MLO synth-2D]
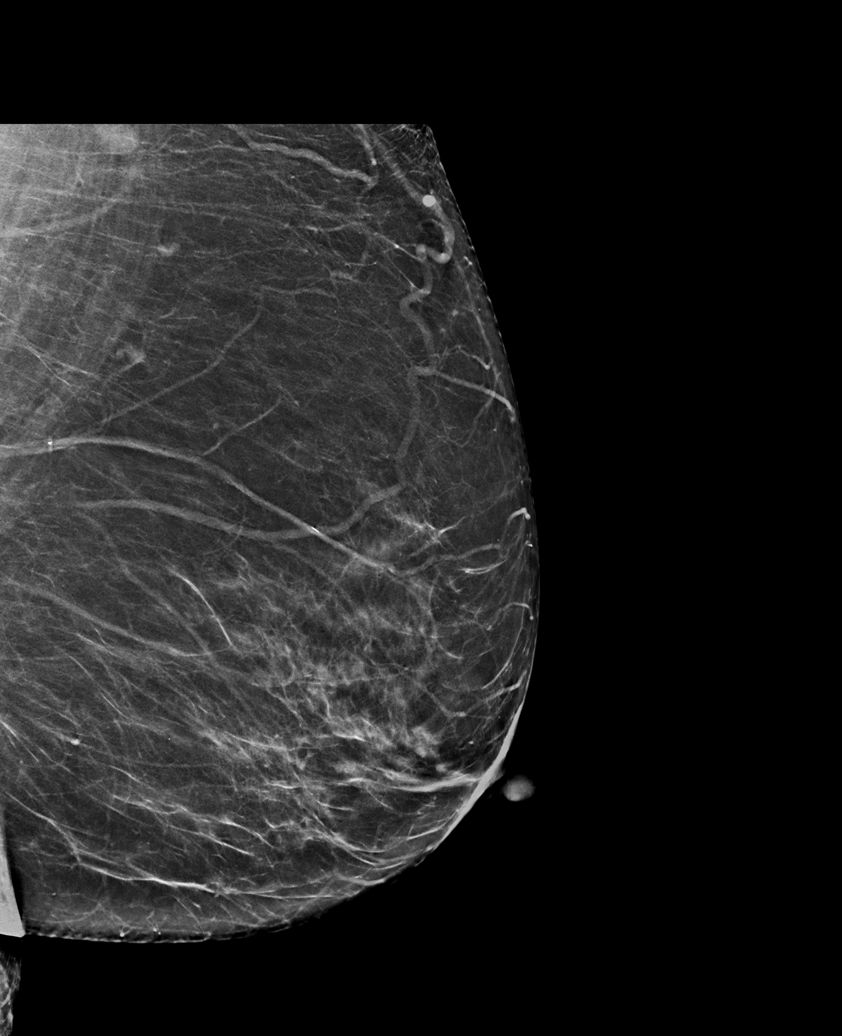

[L CC tomo · tomo slice 33/64.0]
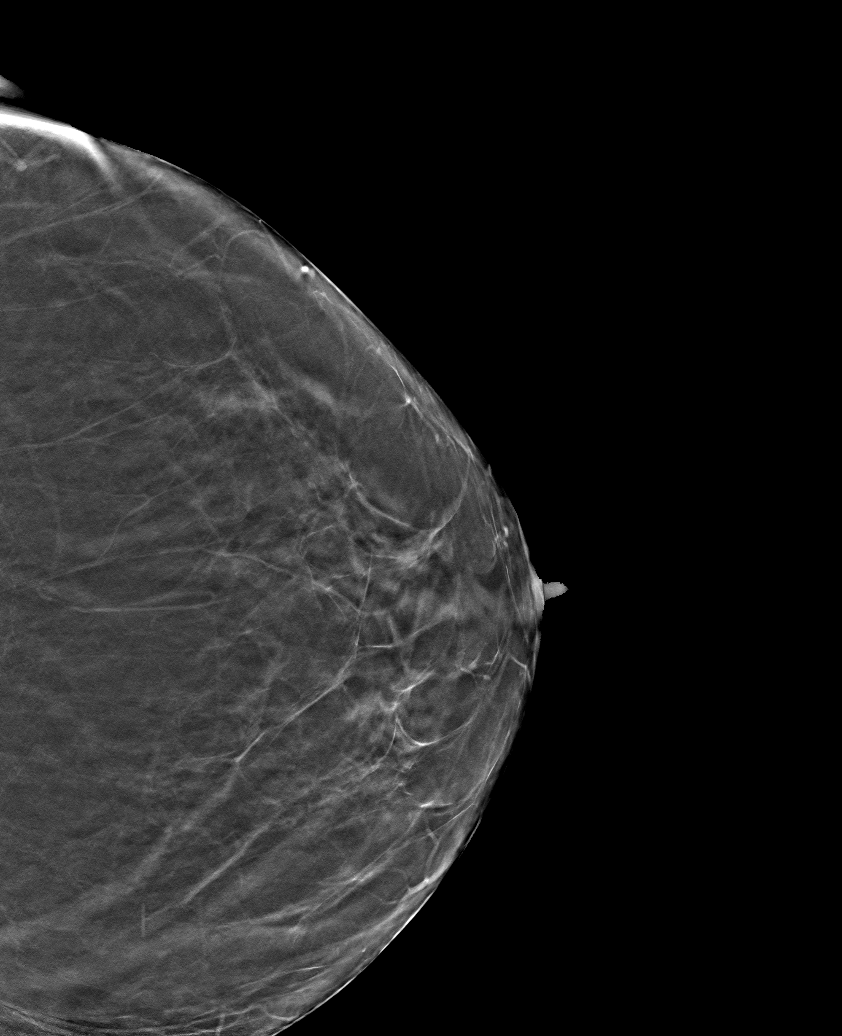

[6 of 30 positions shown; findings below may reference images not displayed]

ACR Breast Density Category b: There are scattered areas of
fibroglandular density.
FINDINGS: There are no findings suspicious for malignancy. Images were
processed with CAD.
IMPRESSION: No mammographic evidence of malignancy. A result letter of this
screening mammogram will be mailed directly to the patient.

RECOMMENDATION:
Screening mammogram in one year. (Code:[id number])

BI-RADS CATEGORY  1: Negative.

## 2020-05-21 NOTE — Progress Notes (Signed)
Chief Complaint  Patient presents with  . Medicare Wellness    fasting AWV/CPE with pelvic. No new concerns.     Monique James is a 79 y.o. female who presents for annual physical exam, Medicare wellness visit and follow-up on chronic medical conditions.  She has the following concerns:  Anxiety--related to diagnosis of atrial fibrillation. On citalopram and doing very well. Not needing any alprazolam. Denies side effects to citalopram. She weaned off as a trialin the past,butafter being off it a week, she had increased anxiety regarding her pulse and checking BP's, realized she was more anxious, and restarted it. Her anxiety is better since being back on the medicationand prefers to continue with it.She denies medication side effects.  Atrial fibrillation--diagnosed 11/2014. Last saw cardiologist Dr. Ascencion Dike 07/2019,and no changes were made her her regimen. It does not appear that she has any f/u appt scheduled. She continues on Eliquis.She denies chest pain, palpitations, bleeding, bruising. CBC's have all been normal, with no e/o anemia. She is due for recheck. She cannot tell when she is in afib. Lab Results  Component Value Date   WBC 7.9 05/02/2019   HGB 13.8 05/02/2019   HCT 41.8 05/02/2019   MCV 96 05/02/2019   PLT 348 05/02/2019   Hypertension--has been well controlled on Cartiaand Losartan.She is compliant and denies side effects.  Denies headaches, dizziness, chest pain, palpitations. BP at home isn't checked often, only when not feeing well, or just sporadic.  140/70-80 is what she can recall.  Pulse is usually in the 70's-80's.  Hyperlipidemia: Controlled by diet, which remains the same.  She tries to limit her cheese intake, avoid creamy dressings. She mostly eats chicken and some pork, infrequent red meat.She has had excellent HDL. She denies any significant dietary changes. Epic has her flagged that she has atherosclerotic heart disease documented (and so  should be on statin)--chart reviewed, including prev CXR and echo, and I don't see mention of this diagnosis. Lab Results  Component Value Date   CHOL 242 (H) 05/02/2019   HDL 111 05/02/2019   LDLCALC 119 (H) 05/02/2019   TRIG 61 05/02/2019   CHOLHDL 2.2 05/02/2019   Vitamin D deficiency: Last level was 30.2 in 04/2019.  She is compliant with taking her daily MVI plus D3 2000 IU daily.  (She thinks the bottle is 1000, and she is taking 2).  She did not increase over the winter.  Allergies: Takes Allegra and Benadryl when she goes for her shots, and just seasonally just prn (only rarely needs).She continues on allergy shotsevery 8 weeks. She reports she spoke to the allergist about COVID vaccines, didn't know the safety of her getting. They aren't giving the vaccines. She didn't talk to them about the other recommended vaccines.  Colon polyps: On colonoscopy 05/2018, 10 polyps were found, one of which was 4m. Path revealed seven simple adenomas,one serrated adenoma, and 2 hyperplastic polyps. No advanced changes were seen. GI reported that no repeat colonoscopy is needed (but that her family should have colonoscopies starting at the age of 430. She recalls possibly being told repeat in3years, but she was groggy and didn't recall exactly what she was told. She denies any GI complaints.  Obesity:  Counseled extensively at prior visits. Previously has done [employer] She was encouraged to log/journal her food as if she was doing [employer] to hold herself accountable. She did it for a while, but stopped.  She watches her portions closely (for meat and cheese), tries to limit  carbs. Snacks some. Shedeclined weight loss clinic due to distance. She doesn't have computer access for virtual visits. She continues to have wine every night, 2 glasses. She is back to the [facility], doing 45 minutes of water aerobics 3x/week. Snacks in the afternoons (crackers, pretzels, rarely chips).   Immunization History   Administered Date(s) Administered  . Pneumococcal Conjugate-13 10/07/2015   Pneumovax, COVID vaccine, Shingrix have al been discussed in detail.  She is hesitant due to her allergies. She was willing to consider Pneumovax, and it was suggested that her daughter bring her for pneumovax (to avoid driving if not feeling well after) Allergic to tetanus, flu shots. Last Pap smear: n/a, s/p hysterectomy  Last mammogram:11/2019 Last colonoscopy:05/2018 Last DEXA: 05/2019 T-1.4 at [facility] fem neck, normal FRAX scores. (prior was 01/2014 at [facility]. T-1.3) Ophtho: yearly Dentist: twice yearly  Exercise: water aerobics 3x/week, some with weights.  Not walking due to the heat.   Other doctors caring for patient include: Dentist: Dr. Truman Hayward Ophtho: Dr. Jorja Loa (at [place]) Allergist: Dr. Verlin Fester GI: Dr.Sandi Fields (retired) Cardiologist: Dr. Harl Bowie Dermatologist: Dr. Nevada Crane   Depression screen: negative Fall screen:none in the last year Functional Status screen: negative (other than forgetting why she walked into a room, nothing significant; has an appt for new glasses). Mini-cog screen: normal (clock--numbers written on the line of the circle, otherwise fine). See epic for full questionnaires.  End of Life Discussion: Patient has a living will and medical power of attorney (scanned in 11/2019).   PMH, PSH, SH and FH were reviewed and updated.  Outpatient Encounter Medications as of 05/22/2020  Medication Sig Note  . apixaban (ELIQUIS) 5 MG TABS tablet Take 1 tablet (5 mg total) by mouth 2 (two) times daily.   . Cholecalciferol (VITAMIN D3) 2000 units TABS Take 2,000 Units by mouth daily.    . citalopram (CELEXA) 20 MG tablet Take 1 tablet by mouth once daily   . diltiazem (CARTIA XT) 180 MG 24 hr capsule TAKE 1 CAPSULE BY MOUTH ONCE DAILY - MEDICATION TO CONTROL HEART RATE   . diphenhydrAMINE (BENADRYL) 25 MG tablet Take 25 mg by mouth every 6 (six) hours as needed for itching or  allergies (ALLERGY VACCINE).  11/13/2019: Takes on the day of her allergy shots  . fexofenadine (ALLEGRA) 180 MG tablet Take 180 mg by mouth daily as needed for allergies (ALLERGY VACINE).  11/13/2019: Takes on the day of her allergy shots  . losartan (COZAAR) 25 MG tablet Take 1 tablet by mouth once daily   . Multiple Vitamins-Minerals (MULTIVITAMIN WITH MINERALS) tablet Take 1 tablet by mouth daily.     [name] Kitchen acetaminophen (TYLENOL) 325 MG tablet Take 650 mg by mouth every 6 (six) hours as needed for moderate pain or headache. (Patient not taking: Reported on 05/22/2020)   . [DISCONTINUED] HYDROcodone-acetaminophen (NORCO) 5-325 MG tablet Take 1-2 tablets by mouth every 6 (six) hours as needed. (Patient not taking: Reported on 11/13/2019)    No facility-administered encounter medications on file as of 05/22/2020.   Allergies  Allergen Reactions  . Bee Venom Anaphylaxis  . Epinephrine Other (See Comments)    High pulse rate.  . Sulfa Antibiotics Nausea And Vomiting  . Tetanus Toxoids Swelling and Other (See Comments)    Entire arm swelled  . Vancomycin Nausea And Vomiting  . Adhesive [Tape] Rash and Other (See Comments)    Her skin peels off.  . Influenza Vaccine Live Swelling and Rash  . Ivp Dye [Iodinated Diagnostic  Agents] Rash  . Zyloprim [Allopurinol] Rash    ROS: The patient denies anorexia, fever, headaches, vision changes, decreased hearing, ear pain, sore throat, breast concerns, chest pain, palpitations, dizziness, syncope, dyspnea on exertion, cough, swelling, nausea, vomiting, diarrhea,abdominal pain, melena, hematochezia, hematuria, incontinence, dysuria, vaginal bleeding, discharge, odor or itch, numbness, tingling, weakness, tremor, suspicious skin lesions, abnormal bleeding/bruising, or enlarged lymph nodes. +arthritis in hands, only occasionally painful (weather-dependent), R ring finger getting crooked. No longer having any wrist pain. R knee, no significant pain, but notices  with weather changes. Rareheartburn with fatty foods (rare, relieved by Tums). Some mild constipation, controlled with high fiber diet and stool softenersprn. Anxiety is well controlled, denies depression.   PHYSICAL EXAM:  BP 140/70   Pulse 80   Ht 5' 2.5" (1.588 m)   Wt 206 lb 9.6 oz (93.7 kg)   BMI 37.19 kg/m   Wt Readings from Last 3 Encounters:  05/22/20 206 lb 9.6 oz (93.7 kg)  11/13/19 208 lb (94.3 kg)  09/30/19 207 lb (93.9 kg)     General Appearance:  Alert, cooperative, no distress, appears stated age. Somewhat excitable  Head:  Normocephalic, without obvious abnormality, atraumatic   Eyes:  PERRL,conjunctiva and sclera are clear. EOM's intact, fundi benign   Ears:  Normal TM's and external ear canals   Nose:  Not examined, wearing mask due to COVID-19 pandemic   Throat:  Not examined, wearing mask due to COVID-19 pandemic  Neck:  Supple, no lymphadenopathy; thyroid: no enlargement/ tenderness/nodules; no carotid bruit or JVD   Back:  Spine nontender, no curvature, ROM normal, no CVA tenderness   Lungs:  Clear to auscultation bilaterally without wheezes, rales or ronchi; respirations unlabored   Chest Wall:  No tenderness or deformity   Heart:  Regular rate and rhythm, S1 and S2 normal, no murmur, rub or gallop.   Breast Exam:  No tenderness, masses, or nipple discharge or inversion. No axillary lymphadenopathy   Abdomen:  Soft, non-tender,obese,nondistended, normoactive bowel sounds, no masses, no hepatosplenomegaly   Genitalia:  Normal external genitalia without lesions, mild atrophic changes. BUS and vagina normal; No abnormal vaginal discharge. Bimanual exam was normal, without any masses appreciable, nontender   Rectal:  Normal sphincter tone, no masses. Heme negative stool  Extremities:  No clubbing, cyanosis or edema.Some bony abnormalities c/w arthritis (especially R ring finger, with ulnar deviation of  the middle phalanx),  Pulses:  2+ and symmetric all extremities   Skin:  Skin color, texture, turgor normal. No rashes or lesions.  Lymph nodes:  Cervical, supraclavicular, and axillary nodes normal   Neurologic:  CNII-XII intact, normal strength, sensation and gait; reflexes 2+ and symmetric throughout   Psych: Normal mood, affect, hygiene and grooming   ASSESSMENT/PLAN:  Annual physical exam  Medicare annual wellness visit, subsequent  Essential hypertension - borderline today. Encouraged to monitor BP regularly for 2 weeks, bring list to cardiologist--reminded to schedule appt, due Sept - Plan: Comprehensive metabolic panel  Atrial fibrillation with rapid ventricular response ([facility]) - asymptomatic, anticoagulated - Plan: TSH  Pure hypercholesterolemia - diet controlled, has had excellent HDL. - Plan: Lipid panel  Anticoagulant long-term use - denies any bleeding - Plan: CBC with Differential/Platelet  Immunization counseling - counseled at length regarding COVID vaccine, as well as pneumovax and Shingrix. Prev considered getting pneumovax if she wasn't driving (driving today)  Generalized anxiety disorder - well controlled, prefers to stay on citalopram. Discussed potentially trying 1/2 tablet dose for a few months, if  willing, to see if effective  Class 2 severe obesity due to excess calories with serious comorbidity and body mass index (BMI) of 37.0 to 37.9 in adult [facility]) - counseled re: risks of obesity, healthy diet, portions, wt loss. Decrease ETOH rec. comorbidity--HTN, HLD, afib  Osteopenia, unspecified location - discussed calcium, D and weight-bearing exercise. Only mild per DEXA last year  Vitamin D deficiency - continue daily supplements.  If D on the low side now, will have her double up October through March (as prev recommended, but not done) - Plan: VITAMIN D 25 Hydroxy (Vit-D Deficiency, Fractures)  Medication monitoring  encounter - Plan: CBC with Differential/Platelet, Comprehensive metabolic panel, VITAMIN D 25 Hydroxy (Vit-D Deficiency, Fractures)   Pneumovax, COVID vaccine, Shingrix all previously discussed, and had been recommended that her daughter bring her for pneumovax (to avoid driving if not feeling well after). She did not return for this (and drove herself). She really is hesitant to get any of these. Discussed COVID vaccine with her allergist. I agree with concern there--to consider if they start giving in their office, to get there.  Needs to schedule f/u with Dr. Harl Bowie (due September)  CBC, c-met, lipid, TSH, D  Forward labs to Dr. Harl Bowie   Discussed monthly self breast exams and yearly mammograms; at least 30 minutes of aerobic activity at least 5 days/week, weight-bearing exercise 2-3x/wk; proper sunscreen use reviewed; healthy diet, including goals of calcium and vitamin D intake and alcohol recommendations (less than or equal to 1 drink/day--encouraged again to cut back) reviewed; regular seatbelt use; changing batteries in smoke detectors. Immunization recommendations discussed--allergic to flu shots.Shingrix, COVID vaccines and pneumovax recommended.  MOST form reviewed, unchanged. Full Code, Full Care  F/u 6 months    Medicare Attestation I have personally reviewed: The patient's medical and social history Their use of alcohol, tobacco or illicit drugs Their current medications and supplements The patient's functional ability including ADLs,fall risks, home safety risks, cognitive, and hearing and visual impairment Diet and physical activities Evidence for depression or mood disorders  The patient's weight, height, BMI have been recorded in the chart.  I have made referrals, counseling, and provided education to the patient based on review of the above and I have provided the patient with a written personalized care plan for preventive services.

## 2020-05-21 NOTE — Patient Instructions (Addendum)
HEALTH MAINTENANCE RECOMMENDATIONS:  It is recommended that you get at least 30 minutes of aerobic exercise at least 5 days/week (for weight loss, you may need as much as 60-90 minutes). This can be any activity that gets your heart rate up. This can be divided in 10-15 minute intervals if needed, but try and build up your endurance at least once a week.  Weight bearing exercise is also recommended twice weekly.  Eat a healthy diet with lots of vegetables, fruits and fiber.  "Colorful" foods have a lot of vitamins (ie green vegetables, tomatoes, red peppers, etc).  Limit sweet tea, regular sodas and alcoholic beverages, all of which has a lot of calories and sugar.  Up to 1 alcoholic drink daily may be beneficial for women (unless trying to lose weight, watch sugars).  Drink a lot of water.  Calcium recommendations are 1200-1500 mg daily (1500 mg for postmenopausal women or women without ovaries), and vitamin D 1000 IU daily.  This should be obtained from diet and/or supplements (vitamins), and calcium should not be taken all at once, but in divided doses.  Monthly self breast exams and yearly mammograms for women over the age of 41 is recommended.  Sunscreen of at least SPF 30 should be used on all sun-exposed parts of the skin when outside between the hours of 10 am and 4 pm (not just when at beach or pool, but even with exercise, golf, tennis, and yard work!)  Use a sunscreen that says "broad spectrum" so it covers both UVA and UVB rays, and make sure to reapply every 1-2 hours.  Remember to change the batteries in your smoke detectors when changing your clock times in the spring and fall.  Use your seat belt every time you are in a car, and please drive safely and not be distracted with cell phones and texting while driving.   Ms. Doutt , Thank you for taking time to come for your Medicare Wellness Visit. I appreciate your ongoing commitment to your health goals. Please review the  following plan we discussed and let me know if I can assist you in the future.   This is a list of the screening recommended for you and due dates:  Health Maintenance  Topic Date Due   COVID-19 Vaccine (1) Never done   Pneumonia vaccines (2 of 2 - PPSV23) 10/06/2016   DEXA scan (bone density measurement)  Completed   As we discussed, COVID vaccines, pneumovax and shingrix are recommended.  Due to your multiple colon polyps, you may need a follow-up colonoscopy (not usually recommended based on age alone)--your GI should contact you when needed (may have said 3 year follow-up, so may not be due yet). Continue yearly mammograms.  You will be due for cardiology follow-up with Dr. Harl Bowie in September.  Please contact his office to schedule an appointment. Please try and monitor your blood pressure and pulse  more regularly at home (maybe daily for a couple of weeks--continue to monitor if lots of fluctuations; if they are pretty stable, 2 weeks of reading is probably fine). Bring this list to your appointment with Dr. Harl Bowie so that adjustments can be made to your medications, if your blood pressure isn't at goal.  Please cut back on wine/alcohol intake to only 1 glass daily (5-6 ounce), at most (consider cutting it out entirely).  This is better for your health (as well as eliminating excess calories).  Try and snack on fruit/vegetables in the afternoons instead of  pretzels/crackers.  If having these in the house, keep the small, individual bag sizes (for portion control).  Try and do indoor exercises (15 minutes twice daily) on days you aren't doing water aerobics.  Remember to double-up on your vitamin D dose from October through March (your levels were on the low end when last checked, and usually will drop over the winter). We are checking the level again to verify that your dose is okay.  Currently, if levels are in the normal range, my recommendation is to continue 2000 IU daily (and  you might just want to pick up a bottle that has 2000 IU per tablet, to take fewer pills), and then double up to 4000 IU October through March, to avoid levels dropping during the colder months.  If your level is low now, I may change that recommendation. We will let you know the results.  Consider getting the COVID vaccine at the allergist's office if they ever get it available there. We discussed possibly getting the Pneumovax in our office (monitoring you for 30 minutes after, and having your daughter drive you, in case you aren't feeling well after the vaccine).

## 2020-05-22 ENCOUNTER — Ambulatory Visit (INDEPENDENT_AMBULATORY_CARE_PROVIDER_SITE_OTHER): Payer: Medicare Other | Admitting: Family Medicine

## 2020-05-22 ENCOUNTER — Encounter: Payer: Self-pay | Admitting: Family Medicine

## 2020-05-22 VITALS — BP 140/70 | HR 80 | Ht 62.5 in | Wt 206.6 lb

## 2020-05-22 DIAGNOSIS — E7800 Pure hypercholesterolemia, unspecified: Secondary | ICD-10-CM

## 2020-05-22 DIAGNOSIS — Z5181 Encounter for therapeutic drug level monitoring: Secondary | ICD-10-CM

## 2020-05-22 DIAGNOSIS — I10 Essential (primary) hypertension: Secondary | ICD-10-CM

## 2020-05-22 DIAGNOSIS — Z7185 Encounter for immunization safety counseling: Secondary | ICD-10-CM

## 2020-05-22 DIAGNOSIS — M8580 Other specified disorders of bone density and structure, unspecified site: Secondary | ICD-10-CM

## 2020-05-22 DIAGNOSIS — Z0000 Encounter for general adult medical examination without abnormal findings: Secondary | ICD-10-CM | POA: Diagnosis not present

## 2020-05-22 DIAGNOSIS — Z7901 Long term (current) use of anticoagulants: Secondary | ICD-10-CM | POA: Diagnosis not present

## 2020-05-22 DIAGNOSIS — E559 Vitamin D deficiency, unspecified: Secondary | ICD-10-CM | POA: Diagnosis not present

## 2020-05-22 DIAGNOSIS — F411 Generalized anxiety disorder: Secondary | ICD-10-CM

## 2020-05-22 DIAGNOSIS — I4891 Unspecified atrial fibrillation: Secondary | ICD-10-CM

## 2020-05-22 DIAGNOSIS — Z6837 Body mass index (BMI) 37.0-37.9, adult: Secondary | ICD-10-CM

## 2020-05-22 DIAGNOSIS — Z7189 Other specified counseling: Secondary | ICD-10-CM

## 2020-05-23 ENCOUNTER — Other Ambulatory Visit: Payer: Self-pay

## 2020-05-23 ENCOUNTER — Ambulatory Visit (INDEPENDENT_AMBULATORY_CARE_PROVIDER_SITE_OTHER): Payer: Medicare Other

## 2020-05-23 DIAGNOSIS — T63441D Toxic effect of venom of bees, accidental (unintentional), subsequent encounter: Secondary | ICD-10-CM | POA: Diagnosis not present

## 2020-05-23 LAB — CBC WITH DIFFERENTIAL/PLATELET
Basophils Absolute: 0.1 10*3/uL (ref 0.0–0.2)
Basos: 1 %
EOS (ABSOLUTE): 0.2 10*3/uL (ref 0.0–0.4)
Eos: 2 %
Hematocrit: 40.4 % (ref 34.0–46.6)
Hemoglobin: 13.6 g/dL (ref 11.1–15.9)
Immature Grans (Abs): 0 10*3/uL (ref 0.0–0.1)
Immature Granulocytes: 0 %
Lymphocytes Absolute: 2.5 10*3/uL (ref 0.7–3.1)
Lymphs: 29 %
MCH: 33 pg (ref 26.6–33.0)
MCHC: 33.7 g/dL (ref 31.5–35.7)
MCV: 98 fL — ABNORMAL HIGH (ref 79–97)
Monocytes Absolute: 0.8 10*3/uL (ref 0.1–0.9)
Monocytes: 9 %
Neutrophils Absolute: 5.1 10*3/uL (ref 1.4–7.0)
Neutrophils: 59 %
Platelets: 326 10*3/uL (ref 150–450)
RBC: 4.12 x10E6/uL (ref 3.77–5.28)
RDW: 12 % (ref 11.7–15.4)
WBC: 8.6 10*3/uL (ref 3.4–10.8)

## 2020-05-23 LAB — COMPREHENSIVE METABOLIC PANEL
ALT: 8 IU/L (ref 0–32)
AST: 13 IU/L (ref 0–40)
Albumin/Globulin Ratio: 1.9 (ref 1.2–2.2)
Albumin: 4.6 g/dL (ref 3.7–4.7)
Alkaline Phosphatase: 84 IU/L (ref 48–121)
BUN/Creatinine Ratio: 16 (ref 12–28)
BUN: 13 mg/dL (ref 8–27)
Bilirubin Total: 0.4 mg/dL (ref 0.0–1.2)
CO2: 24 mmol/L (ref 20–29)
Calcium: 9.8 mg/dL (ref 8.7–10.3)
Chloride: 93 mmol/L — ABNORMAL LOW (ref 96–106)
Creatinine, Ser: 0.79 mg/dL (ref 0.57–1.00)
GFR calc Af Amer: 82 mL/min/{1.73_m2} (ref >59)
GFR calc non Af Amer: 71 mL/min/{1.73_m2} (ref >59)
Globulin, Total: 2.4 g/dL (ref 1.5–4.5)
Glucose: 86 mg/dL (ref 65–99)
Potassium: 4.4 mmol/L (ref 3.5–5.2)
Sodium: 133 mmol/L — ABNORMAL LOW (ref 134–144)
Total Protein: 7 g/dL (ref 6.0–8.5)

## 2020-05-23 LAB — LIPID PANEL
Chol/HDL Ratio: 2.2 ratio (ref 0.0–4.4)
Cholesterol, Total: 237 mg/dL — ABNORMAL HIGH (ref 100–199)
HDL: 109 mg/dL (ref >39)
LDL Chol Calc (NIH): 118 mg/dL — ABNORMAL HIGH (ref 0–99)
Triglycerides: 60 mg/dL (ref 0–149)
VLDL Cholesterol Cal: 10 mg/dL (ref 5–40)

## 2020-05-23 LAB — TSH: TSH: 1.87 u[IU]/mL (ref 0.450–4.500)

## 2020-05-23 LAB — VITAMIN D 25 HYDROXY (VIT D DEFICIENCY, FRACTURES): Vit D, 25-Hydroxy: 32.3 ng/mL (ref 30.0–100.0)

## 2020-05-24 ENCOUNTER — Encounter: Payer: Self-pay | Admitting: Family Medicine

## 2020-05-24 NOTE — Progress Notes (Signed)
FYI--labs on mutual patient. She was reminded to schedule appt with you for September.  She is supposed to be monitoring her BP and bring a list to you (was 140/70, she was excitable and it was higher when rechecked). 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  Let me know if you think she needs med, or if you know how to get that off of her chart if not true (I didn't see any mention on CXR or echo, maybe I'm missing something).  Thanks!

## 2020-06-06 DIAGNOSIS — D1801 Hemangioma of skin and subcutaneous tissue: Secondary | ICD-10-CM | POA: Diagnosis not present

## 2020-06-06 DIAGNOSIS — L603 Nail dystrophy: Secondary | ICD-10-CM | POA: Diagnosis not present

## 2020-06-06 DIAGNOSIS — B351 Tinea unguium: Secondary | ICD-10-CM | POA: Diagnosis not present

## 2020-06-29 ENCOUNTER — Other Ambulatory Visit: Payer: Self-pay | Admitting: Cardiology

## 2020-07-04 ENCOUNTER — Other Ambulatory Visit: Payer: Self-pay | Admitting: Family Medicine

## 2020-07-04 DIAGNOSIS — F411 Generalized anxiety disorder: Secondary | ICD-10-CM

## 2020-07-18 ENCOUNTER — Other Ambulatory Visit: Payer: Self-pay

## 2020-07-18 ENCOUNTER — Ambulatory Visit: Payer: Medicare Other | Admitting: Allergy & Immunology

## 2020-07-18 ENCOUNTER — Encounter: Payer: Self-pay | Admitting: Allergy & Immunology

## 2020-07-18 ENCOUNTER — Ambulatory Visit (INDEPENDENT_AMBULATORY_CARE_PROVIDER_SITE_OTHER): Payer: Medicare Other | Admitting: *Deleted

## 2020-07-18 VITALS — BP 136/72 | HR 82 | Temp 97.8°F | Resp 16 | Ht 62.0 in | Wt 209.6 lb

## 2020-07-18 DIAGNOSIS — T63441D Toxic effect of venom of bees, accidental (unintentional), subsequent encounter: Secondary | ICD-10-CM

## 2020-07-18 NOTE — Patient Instructions (Addendum)
1. Venom hypersensitivity  - Continue with allergy shots at the same schedule. - Sample of [place] provided today. - It seems that you are doing very well with the current schedule.   2. Return in about 2 years (around 07/18/2022).    Please inform us of any Emergency Department visits, hospitalizations, or changes in symptoms. Call us before going to the ED for breathing or allergy symptoms since we might be able to fit you in for a sick visit. Feel free to contact us anytime with any questions, problems, or concerns.  It was a pleasure to meet you today!  Websites that have reliable patient information: 1. American Academy of Asthma, Allergy, and Immunology: www.aaaai.org 2. Food Allergy Research and Education (FARE): foodallergy.org 3. Mothers of Asthmatics: http://www.asthmacommunitynetwork.org 4. American College of Allergy, Asthma, and Immunology: www.acaai.org   COVID-19 Vaccine Information can be found at: ShippingScam.co.uk For questions related to vaccine distribution or appointments, please email [email or web address] or call [phone].     "Like" [place] on Facebook and Instagram for our latest updates!       Make sure you are registered to vote! If you have moved or changed any of your contact information, you will need to get this updated before voting!  In some cases, you MAY be able to register to vote online: [email or web address]

## 2020-07-18 NOTE — Progress Notes (Signed)
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 is doing very well on her venom immunotherapy.  She has spaced to every 8 weeks.  She has had a few accidental stings with no reactions.  She does like to spend a lot of time outdoors, so she would like to continue with the venom immunotherapy.  I think that is completely reasonable.  We did give her a sample of an Auvi-Q epinephrine autoinjector so that she will have one on hand if needed.  Plan/Recommendations:   1. Venom hypersensitivity  - Continue with allergy shots at the same schedule. - Sample of [place] provided today. - It seems that you are doing very well with the current schedule.   2. Return in about 2 years (around 07/18/2022).   Subjective:   Monique James is a 79 y.o. female presenting today for follow up of  Chief Complaint  Patient presents with  . Follow-up    Monique James has a history of the following: Patient Active Problem List   Diagnosis Date Noted  . Encounter for screening colonoscopy   . History of colonic polyps 12/01/2017  . Taking medication for chronic disease 12/01/2017  . Generalized anxiety disorder 08/26/2015  . 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 Atrial fibrillation with rapid ventricular response ([place]) 11/05/2014  . Hypertension 11/05/2014  . Obesity 11/05/2014  . A-fib ([place]) 11/05/2014  . Obesity (BMI 30-39.9) 02/08/2014  . Pure hypercholesterolemia 02/02/2013  . Essential hypertension, benign 07/30/2011  . Hyponatremia 07/30/2011    History obtained from: chart review and patient.  Monique James is a 79 y.o. female presenting for a follow up visit. She has a history of venom immunotherapy. She was last seen in October 2020 by Dr. Verlin Fester. At that time, her epinephrine auto-injector was refilled.  Since the last visit, she has done well. She has  been stung a couple of times since starting shots, but she had no reactions. The last one was in 2019. She is tolerating the injections fairly well. She will have some redness for a day or two and then it resolves. She does take an antihistamine on the days when she comes in.  She has been doing very well during the pandemic.  She has not contracted COVID-19 and is fully vaccinated.  She has very much looking forward to any kind of booster she can get.  She has 3 daughters and 1 granddaughter.  1 daughter lives in [place] and apparently consumes quite a bit of [employer].  She is clearly very worried about this daughter.  She actually lives in [place], but continues to come to this office for her venom shots.  [place] office is closer, but she likes coming into the big town of [place] for shopping and eating out.   Otherwise, there have been no changes to her past medical history, surgical history, family history, or social history.    Review of Systems  Constitutional: Negative.  Negative for chills, fever, malaise/fatigue and weight loss.  HENT: Negative for congestion, ear discharge, ear pain and sinus pain.   Eyes: Negative for pain, discharge and redness.  Respiratory: Negative for cough, sputum production, shortness of breath and wheezing.   Cardiovascular: Negative.  Negative for chest pain and palpitations.  Gastrointestinal: Negative for abdominal pain, constipation, diarrhea, heartburn, nausea and vomiting.  Skin:  Negative.  Negative for itching and rash.  Neurological: Negative for dizziness and headaches.  Endo/Heme/Allergies: Negative for environmental allergies. Does not bruise/bleed easily.       Objective:   Blood pressure 136/72, pulse 82, temperature 97.8 F (36.6 C), temperature source Temporal, resp. rate 16, height 5\' 2"  (1.575 m), weight 209 lb 9.6 oz (95.1 kg), SpO2 96 %. Body mass index is 38.34 kg/m.   Physical Exam:  Physical  Exam Constitutional:      Appearance: She is well-developed.     Comments: Very friendly.  HENT:     Head: Normocephalic and atraumatic.     Right Ear: Tympanic membrane, ear canal and external ear normal.     Left Ear: Tympanic membrane, ear canal and external ear normal.     Nose: No nasal deformity, septal deviation, mucosal edema or rhinorrhea.     Right Sinus: No maxillary sinus tenderness or frontal sinus tenderness.     Left Sinus: No maxillary sinus tenderness or frontal sinus tenderness.     Mouth/Throat:     Mouth: Mucous membranes are not pale and not dry.     Pharynx: Uvula midline.  Eyes:     General:        Right eye: No discharge.        Left eye: No discharge.     Conjunctiva/sclera: Conjunctivae normal.     Right eye: Right conjunctiva is not injected. No chemosis.    Left eye: Left conjunctiva is not injected. No chemosis.    Pupils: Pupils are equal, round, and reactive to light.  Cardiovascular:     Rate and Rhythm: Normal rate and regular rhythm.     Heart sounds: Normal heart sounds.  Pulmonary:     Effort: Pulmonary effort is normal. No tachypnea, accessory muscle usage or respiratory distress.     Breath sounds: Normal breath sounds. No wheezing, rhonchi or rales.     Comments: Moving air well in all lung fields.  No increased work of breathing. Chest:     Chest wall: No tenderness.  Lymphadenopathy:     Cervical: No cervical adenopathy.  Skin:    Coloration: Skin is not pale.     Findings: No abrasion, erythema, petechiae or rash. Rash is not papular, urticarial or vesicular.  Neurological:     Mental Status: She is alert.      Diagnostic studies: none   Monique Marvel, MD  [facility]

## 2020-09-08 ENCOUNTER — Other Ambulatory Visit: Payer: Self-pay | Admitting: Cardiology

## 2020-09-12 ENCOUNTER — Other Ambulatory Visit: Payer: Self-pay

## 2020-09-12 ENCOUNTER — Ambulatory Visit (INDEPENDENT_AMBULATORY_CARE_PROVIDER_SITE_OTHER): Payer: Medicare Other

## 2020-09-12 DIAGNOSIS — T63441D Toxic effect of venom of bees, accidental (unintentional), subsequent encounter: Secondary | ICD-10-CM | POA: Diagnosis not present

## 2020-09-16 ENCOUNTER — Ambulatory Visit: Payer: Medicare Other | Admitting: Cardiology

## 2020-09-23 NOTE — Progress Notes (Signed)
Cardiology Office Note    Date:  09/30/2020   ID:  Monique James, DOB 1941/05/24, MRN [id number]  PCP:  Rita Ohara, MD  Cardiologist: Carlyle Dolly, MD EPS: None  Chief Complaint  Patient presents with  . Follow-up    History of Present Illness:  Monique James is a 79 y.o. female with history of PAF on Eliquis, hypertension.  Last saw Dr. Harl Bowie 07/18/2019 at which time she was stable.  Patient comes in for f/u. Denies chest pain, palpitations, dyspnea, dizziness or syncope. Goes to the [facility] 3 times/week for water aerobics. Lives alone, does her own house work.No bleeding problems on Eliquis. BP up a little today but normal at home.     Past Medical History:  Diagnosis Date  . Anxiety   . Atrial fibrillation with rapid ventricular response ([place]) 11/05/2014   New dx  . 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 Shaune Leeks)  . Colon polyps    Dr. Oletta Lamas  . Colon polyps    path was small leiomyoma; tubular adenoma 12/2017  . Constipation   . Gout    history of (1985ish)  . Hemorrhoids   . History of IBS   . Hx of pelvic mass    complex, benign pathology  . Hyperlipidemia    elevated trigs and LDL  . Hypertension   . Hyponatremia    mild  . Osteopenia    mild (T-1.3 in 01/2014)  . Shingles 02/2015    Past Surgical History:  Procedure Laterality Date  . ABDOMINAL HYSTERECTOMY  1970   for perforated uterus from IUD  . BILATERAL SALPINGOOPHORECTOMY  2005   benign tumor/cyst (Dr. Aldean Ast)  . COLONOSCOPY N/A 12/31/2017   Procedure: COLONOSCOPY;  Surgeon: Danie Binder, MD;  Location[facility];  Service: Endoscopy;  Laterality: N/A;  10:30am  . COLONOSCOPY N/A 05/23/2018   Procedure: COLONOSCOPY;  Surgeon: Danie Binder, MD;  Location: [facility];  Service: Endoscopy;  Laterality: N/A;  8:30am  . POLYPECTOMY  05/23/2018   Procedure: POLYPECTOMY;  Surgeon: Danie Binder, MD;  Location: [facility];  Service: Endoscopy;;  ascending,hepatic,  sigmoid,transverse    Current Medications: Current Meds  Medication Sig  . Cholecalciferol (VITAMIN D3) 2000 units TABS Take 2,000 Units by mouth daily.   [name] Kitchen diltiazem (CARTIA XT) 180 MG 24 hr capsule TAKE 1 CAPSULE BY MOUTH ONCE DAILY - MEDICATION TO CONTROL HEART RATE  . diphenhydrAMINE (BENADRYL) 25 MG tablet Take 25 mg by mouth every 6 (six) hours as needed for itching or allergies (ALLERGY VACCINE).   [name] Kitchen ELIQUIS 5 MG TABS tablet Take 1 tablet by mouth twice daily  . fexofenadine (ALLEGRA) 180 MG tablet Take 180 mg by mouth daily as needed for allergies (ALLERGY VACINE).   [name] Kitchen losartan (COZAAR) 25 MG tablet Take 1 tablet by mouth once daily  . Multiple Vitamins-Minerals (MULTIVITAMIN WITH MINERALS) tablet Take 1 tablet by mouth daily.    . [DISCONTINUED] citalopram (CELEXA) 20 MG tablet Take 1 tablet by mouth once daily     Allergies:   Bee venom, Epinephrine, Sulfa antibiotics, Tetanus toxoids, Vancomycin, Adhesive [tape], Influenza vaccine live, Ivp dye [iodinated diagnostic agents], and Zyloprim [allopurinol]   Social History   Socioeconomic History  . Marital status: Widowed    Spouse name: Not on file  . Number of children: 3  . Years of education: Not on file  . Highest education level: Not on file  Occupational History  .  Occupation: retired Therapist, sports  Tobacco Use  . Smoking status: Never Smoker  . Smokeless tobacco: Never Used  Vaping Use  . Vaping Use: Never used  Substance and Sexual Activity  . Alcohol use: Yes    Alcohol/week: 14.0 standard drinks    Types: 14 Glasses of wine per week    Comment: 2 glasses of wine daily  . Drug use: No  . Sexual activity: Not Currently  Other Topics Concern  . Not on file  Social History Narrative   Lives alone, 2 dog (got 7# shelter mix after her boxer passed away, and still has 20# dog (Peanut), almost 12yo);  Daughter lives nearby.  3 grandchildren (2 here, 1 in [place]).  Other children in [place].   Widowed (age  46--husband had aortic aneurysm repair, and graft failed/ruptured)--death anniversary date Oct 09, 2023.  He had been a Holocaust survivor.   Social Determinants of Health   Financial Resource Strain:   . Difficulty of Paying Living Expenses: Not on file  Food Insecurity:   . Worried About Charity fundraiser in the Last Year: Not on file  . Ran Out of Food in the Last Year: Not on file  Transportation Needs:   . Lack of Transportation (Medical): Not on file  . Lack of Transportation (Non-Medical): Not on file  Physical Activity:   . Days of Exercise per Week: Not on file  . Minutes of Exercise per Session: Not on file  Stress:   . Feeling of Stress : Not on file  Social Connections:   . Frequency of Communication with Friends and Family: Not on file  . Frequency of Social Gatherings with Friends and Family: Not on file  . Attends Religious Services: Not on file  . Active Member of Clubs or Organizations: Not on file  . Attends Archivist Meetings: Not on file  . Marital Status: Not on file     Family History:  The patient's family history includes Cancer in her brother and father; Dementia in her mother; Healthy in her daughter, daughter, and daughter; Heart disease in her mother; Hypertension in her mother.   ROS:   Please see the history of present illness.    ROS All other systems reviewed and are negative.   PHYSICAL EXAM:   VS:  BP (!) 146/88   Ht 5\' 2"  (1.575 m)   Wt 207 lb 3.2 oz (94 kg)   SpO2 95%   BMI 37.90 kg/m   Physical Exam  GEN: Obese, in no acute distress  Neck: no JVD, carotid bruits, or masses Cardiac:RRR; no murmurs, rubs, or gallops  Respiratory:  clear to auscultation bilaterally, normal work of breathing GI: soft, nontender, nondistended, + BS Ext: without cyanosis, clubbing, or edema, Good distal pulses bilaterally Neuro:  Alert and Oriented x 3 Psych: euthymic mood, full affect  Wt Readings from Last 3 Encounters:  09/30/20 207 lb 3.2 oz  (94 kg)  07/18/20 209 lb 9.6 oz (95.1 kg)  05/22/20 206 lb 9.6 oz (93.7 kg)      Studies/Labs Reviewed:   EKG:  EKG is  ordered today.  The ekg ordered today demonstrates NSR, no acute change  Recent Labs: 05/22/2020: ALT 8; BUN 13; Creatinine, Ser 0.79; Hemoglobin 13.6; Platelets 326; Potassium 4.4; Sodium 133; TSH 1.870   Lipid Panel    Component Value Date/Time   CHOL 237 (H) 05/22/2020 1451   TRIG 60 05/22/2020 1451   HDL 109 05/22/2020 1451  CHOLHDL 2.2 05/22/2020 1451   CHOLHDL 2.2 04/05/2017 1008   VLDL 14 04/05/2017 1008   LDLCALC 118 (H) 05/22/2020 1451    Additional studies/ records that were reviewed today include:  Jan 2016 echo Study Conclusions  - Left ventricle: The cavity size was normal. Wall thickness was   increased in a pattern of moderate LVH. Systolic function was   vigorous. The estimated ejection fraction was in the range of 65%   to 70%. Diastolic function is abnormal, indeterminant grade. Wall   motion was normal; there were no regional wall motion   abnormalities. - Aortic valve: Mildly calcified annulus. Trileaflet; mildly   thickened leaflets. - Mitral valve: Mildly calcified annulus. Mildly thickened leaflets         ASSESSMENT:    1. Paroxysmal atrial fibrillation (HCC)   2. Essential hypertension   3. Obesity (BMI 30-39.9)      PLAN:  In order of problems listed above:  PAF on Eliquis and diltiazem. No bleeding problems. Blood work in July normal. Will have it repeated in Jan at PCP. Eliquis is costing her $400 for 3 months. Doesn't qualify for patient assistance because she has insurance.  Essential hypertension usually elevated in clinic but at goal at home. Continue to monitor  Obesity-exercise and weight loss discussed.     Medication Adjustments/Labs and Tests Ordered: Current medicines are reviewed at length with the patient today.  Concerns regarding medicines are outlined above.  Medication changes, Labs and Tests  ordered today are listed in the Patient Instructions below. There are no Patient Instructions on file for this visit.   Sumner Boast, PA-C  09/30/2020 11:43 AM    [facility] [address], [place], [place]  [address] Phone: [phone]; Fax: [phone]

## 2020-09-28 ENCOUNTER — Other Ambulatory Visit: Payer: Self-pay | Admitting: Family Medicine

## 2020-09-28 DIAGNOSIS — F411 Generalized anxiety disorder: Secondary | ICD-10-CM

## 2020-09-30 ENCOUNTER — Encounter: Payer: Self-pay | Admitting: Physician Assistant

## 2020-09-30 ENCOUNTER — Ambulatory Visit: Payer: Medicare Other | Admitting: Physician Assistant

## 2020-09-30 ENCOUNTER — Other Ambulatory Visit: Payer: Self-pay

## 2020-09-30 VITALS — BP 146/88 | Ht 62.0 in | Wt 207.2 lb

## 2020-09-30 DIAGNOSIS — I480 Paroxysmal atrial fibrillation: Secondary | ICD-10-CM

## 2020-09-30 DIAGNOSIS — I10 Essential (primary) hypertension: Secondary | ICD-10-CM | POA: Diagnosis not present

## 2020-09-30 DIAGNOSIS — E669 Obesity, unspecified: Secondary | ICD-10-CM | POA: Diagnosis not present

## 2020-09-30 MED ORDER — APIXABAN 5 MG PO TABS: 5.0000 mg | ORAL_TABLET | Freq: Two times a day (BID) | ORAL | 3 refills | Status: DC

## 2020-09-30 MED ORDER — LOSARTAN POTASSIUM 25 MG PO TABS: 25.0000 mg | ORAL_TABLET | Freq: Every day | ORAL | 3 refills | Status: DC

## 2020-09-30 MED ORDER — DILTIAZEM HCL ER COATED BEADS 180 MG PO CP24: ORAL_CAPSULE | ORAL | 3 refills | Status: DC

## 2020-09-30 NOTE — Patient Instructions (Signed)
Medication Instructions:  Your physician recommends that you continue on your current medications as directed. Please refer to the Current Medication list given to you today.  *If you need a refill on your cardiac medications before your next appointment, please call your pharmacy*  Lab Work: None ordered today  Testing/Procedures: None ordered today  Follow-Up: At [facility], you and your health needs are our priority.  As part of our continuing mission to provide you with exceptional heart care, we have created designated Provider Care Teams.  These Care Teams include your primary Cardiologist (physician) and Advanced Practice Providers (APPs -  Physician Assistants and Nurse Practitioners) who all work together to provide you with the care you need, when you need it.  Your next appointment:   6 month(s)  The format for your next appointment:   In Person  Provider:   Carlyle Dolly, MD

## 2020-10-01 ENCOUNTER — Other Ambulatory Visit (HOSPITAL_COMMUNITY): Payer: Self-pay | Admitting: Emergency Medicine

## 2020-10-01 DIAGNOSIS — Z1231 Encounter for screening mammogram for malignant neoplasm of breast: Secondary | ICD-10-CM

## 2020-10-10 DIAGNOSIS — M7130 Other bursal cyst, unspecified site: Secondary | ICD-10-CM | POA: Diagnosis not present

## 2020-10-10 DIAGNOSIS — X32XXXD Exposure to sunlight, subsequent encounter: Secondary | ICD-10-CM | POA: Diagnosis not present

## 2020-10-10 DIAGNOSIS — L570 Actinic keratosis: Secondary | ICD-10-CM | POA: Diagnosis not present

## 2020-10-10 DIAGNOSIS — L430 Hypertrophic lichen planus: Secondary | ICD-10-CM | POA: Diagnosis not present

## 2020-10-17 DIAGNOSIS — M19042 Primary osteoarthritis, left hand: Secondary | ICD-10-CM | POA: Diagnosis not present

## 2020-10-17 DIAGNOSIS — R2232 Localized swelling, mass and lump, left upper limb: Secondary | ICD-10-CM | POA: Diagnosis not present

## 2020-11-07 ENCOUNTER — Other Ambulatory Visit: Payer: Self-pay

## 2020-11-07 ENCOUNTER — Ambulatory Visit (INDEPENDENT_AMBULATORY_CARE_PROVIDER_SITE_OTHER): Payer: Medicare Other

## 2020-11-07 DIAGNOSIS — T63441D Toxic effect of venom of bees, accidental (unintentional), subsequent encounter: Secondary | ICD-10-CM | POA: Diagnosis not present

## 2020-11-08 ENCOUNTER — Ambulatory Visit (HOSPITAL_COMMUNITY)
Admission: RE | Admit: 2020-11-08 | Discharge: 2020-11-08 | Disposition: A | Discharge status: Home / Self Care | Payer: Medicare Other | Source: Ambulatory Visit | Attending: Emergency Medicine | Admitting: Emergency Medicine

## 2020-11-08 DIAGNOSIS — Z1231 Encounter for screening mammogram for malignant neoplasm of breast: Secondary | ICD-10-CM

## 2020-11-27 ENCOUNTER — Encounter: Payer: Medicare Other | Admitting: Family Medicine

## 2020-11-27 ENCOUNTER — Other Ambulatory Visit: Payer: Self-pay

## 2020-11-27 DIAGNOSIS — R2232 Localized swelling, mass and lump, left upper limb: Secondary | ICD-10-CM | POA: Diagnosis not present

## 2020-11-27 DIAGNOSIS — M71342 Other bursal cyst, left hand: Secondary | ICD-10-CM | POA: Diagnosis not present

## 2020-11-27 DIAGNOSIS — M67442 Ganglion, left hand: Secondary | ICD-10-CM | POA: Diagnosis not present

## 2020-12-03 NOTE — Progress Notes (Addendum)
Chief Complaint  Patient presents with   med check   ' She underwent excisional biopsy mass left long finger on 11/27/20 with Dr. Burney Gauze. She had f/u earlier this week, pathology wasn't back yet.  Sutures come out next week. Pathology report is available now, showing ganglion cyst.  Anxiety--related to diagnosis of atrial fibrillation. On citalopram and doing very well, no side effects. Not needing any alprazolam. (She previously weaned off as a trialin the past,butafter being off it a week, she had increased anxiety regarding her pulse and checking BP's, realized she was more anxious, resumed citalopram and anxiety has remained well controlled).   Atrial fibrillation--diagnosed 11/2014. Last saw cardiologist in 11/2021and no changes were made her her regimen. She continues on Diltiazem and Eliquis.She denies chest pain, palpitations, bleeding, bruising. CBC's have all been normal, with no e/o anemia. She cannot tell when she is in afib. Cardiologist wanted her to have b-met and CBC done. Lab Results  Component Value Date   WBC 8.6 05/22/2020   HGB 13.6 05/22/2020   HCT 40.4 05/22/2020   MCV 98 (H) 05/22/2020   PLT 326 05/22/2020   Hypertension--has reportedly been well controlled on Cartiaand Losartan.She is compliant and denies side effects. Denies headaches, dizziness, chest pain, palpitations. BP at [facility] 138-140/70-78, higher at doctor visits. Doesn't check very often. She tries to follow low sodium diet.  BP Readings from Last 3 Encounters:  12/04/20 (!) 150/74  09/30/20 (!) 146/88  07/18/20 136/72   Hyperlipidemia: Controlled by diet, which remains the same. She has high HDL. She tries tolimit her cheese intake, avoidcreamy dressings. She mostly eats chicken and some pork,infrequent red meat.She has had excellent HDL. She denies any significant dietary changes. Epic has her flagged that she has atherosclerotic heart disease documented (and so should be on  statin)--chart reviewed, including prev CXR and echo, and I don't see mention of this diagnosis. Lab Results  Component Value Date   CHOL 237 (H) 05/22/2020   HDL 109 05/22/2020   LDLCALC 118 (H) 05/22/2020   TRIG 60 05/22/2020   CHOLHDL 2.2 05/22/2020   Vitamin D deficiency: Last level was32.3 in 05/2020. She is compliant with taking her daily MVI plus D3 2000 IU daily. (She thinks the bottle is 1000, and she is taking 2).   Allergies: Takes Allegra and Benadryl when she goes for her shots, and just seasonally just prn (only rarely needs).She continues on allergy shotsevery 8 weeks.  She has had her COVID vaccines (first 2) without any problems.  Colon polyps: Oncolonoscopy 05/2018,10 polyps were found, one of which was 15m. Path revealed seven simple adenomas,one serrated adenoma, and 2 hyperplastic polyps. No advanced changes were seen. GI reported that no repeat colonoscopy is needed (but that her family should have colonoscopies starting at the age of 429.  She denies any GI complaints.  Obesity:  Counseled extensively at prior visits. Previously has done [employer] She tries to watch her portions.  She increased her fruit and vegetables, cut back on snacks (chips/crackers/pretzels) [facility] last went in November. Plans to go back 3x/week.   PMH, PSH, SH reviewed  Outpatient Encounter Medications as of 12/04/2020  Medication Sig Note   apixaban (ELIQUIS) 5 MG TABS tablet Take 1 tablet (5 mg total) by mouth 2 (two) times daily.    Cholecalciferol (VITAMIN D3) 2000 units TABS Take 2,000 Units by mouth daily.     citalopram (CELEXA) 20 MG tablet  Take 1 tablet by mouth once daily    diltiazem (CARTIA XT) 180 MG 24 hr capsule TAKE 1 CAPSULE BY MOUTH ONCE DAILY - MEDICATION TO CONTROL HEART RATE    diphenhydrAMINE (BENADRYL) 25 MG tablet Take 25 mg by mouth every 6 (six) hours as needed for itching or allergies  (ALLERGY VACCINE).  11/13/2019: Takes on the day of her allergy shots   fexofenadine (ALLEGRA) 180 MG tablet Take 180 mg by mouth daily as needed for allergies (ALLERGY VACINE).  11/13/2019: Takes on the day of her allergy shots   Multiple Vitamins-Minerals (MULTIVITAMIN WITH MINERALS) tablet Take 1 tablet by mouth daily.    [DISCONTINUED] losartan (COZAAR) 25 MG tablet Take 1 tablet (25 mg total) by mouth daily.    losartan (COZAAR) 50 MG tablet Take 1 tablet (50 mg total) by mouth daily.    No facility-administered encounter medications on file as of 12/04/2020.   Cozaar dose was 62m prior to today's visit)  Allergies  Allergen Reactions   Bee Venom Anaphylaxis   Epinephrine Other (See Comments)    High pulse rate.   Sulfa Antibiotics Nausea And Vomiting   Tetanus Toxoids Swelling and Other (See Comments)    Entire arm swelled   Vancomycin Nausea And Vomiting   Adhesive [Tape] Rash and Other (See Comments)    Her skin peels off.   Influenza Vaccine Live Swelling and Rash   Ivp Dye [Iodinated Diagnostic Agents] Rash   Zyloprim [Allopurinol] Rash    ROS:  No fever, chills, URI symptoms, headaches, dizziness, chest pain, shortness of breath, palpitations. No nausea, vomiting, bowel changes. No edema.  No urinary complaints. No bleeding. Moods are good. Some mild constipation, controlled with high fiber diet and stool softenersprn.   PHYSICAL EXAM:  BP (!) 150/74    Pulse 78    Temp (!) 97.1 F (36.2 C)    Wt 207 lb 11.2 oz (94.2 kg)    SpO2 97%    BMI 37.99 kg/m   Wt Readings from Last 3 Encounters:  12/04/20 207 lb 11.2 oz (94.2 kg)  09/30/20 207 lb 3.2 oz (94 kg)  07/18/20 209 lb 9.6 oz (95.1 kg)   Well developed, pleasant, obese female in no distress. HEENT: PERRL, EOMI, conjunctiva and sclera are clear. Wearing mask due to COVID-19 pandemic. Neck: no lymphadenopathy, thyromegaly or mass, no bruit Heart: heart rhythm seemed regular, no murmur noted. Lungs:  clear bilaterally Back: no spinal or CVA tenderness Abdomen: obese, soft, nontender Extremities: no edema, normal pulses. Bandage on L 3rd finger. Psych: normal mood, affect, hygiene and grooming Neuro: alert and oriented, normal gait.   ASSESSMENT/PLAN:  Essential hypertension - BP's above goal, even at home. Increase losartan to 541m f/u in 3-4 wks, bring BP list. check chem panel (and CBC) at f/u visit - Plan: losartan (COZAAR) 50 MG tablet  Atrial fibrillation with rapid ventricular response (HCC) - rate controlled, anticoagulated. Under care of cardiology  Anticoagulant long-term use - no bleeding.  Check CBC at her f/u visit  Generalized anxiety disorder - well controlled, cont citalopram  Vitamin D deficiency - cont daily supplements   Pneumovax recommended, but pt came alone. Discussed that she could schedule NV and come with daughter, vs get from pharmacy if that was more convenient for her. Shingrix also recommended (from pharmacy).  F/u 1 month for f/u on BP since losartan dose increased, and c-met and CBC.    IBlood pressures at home are above goal (and it is  higher in the office). Increase your losartan to 33m. You can take 2 of the 240mtablets together to use up what you have left.  We are sending in the prescription for the 5078mablets today.. Monitor your blood pressure daily and record the values. Bring the list with you to your visit in 3 weeks.  If you're feeling dizzy, if your blood pressure is under 110 and you don't feel good, then we should go back to the 53m6mse and call us. Koreae will want to see you back here in the office in about 1 month (3-4 weeks) to do your labwork and check blood pressure in the office. The potassium and kidneys need to be checked when the losartan dose is raised--so we are postponing the labs from today until next month so we don't have to stick you twice.  Pneumovax (one of the pneumonia vaccines) is recommended (you have  only had prevnar-13).  You had said you wanted to come with your daughter (her driving) to get this vaccine.  You can get this from the pharmacy, if that is more convenient, vs scheduling a nurse visit, or having her come with you next month. You want to have this separated from any other vaccine by 2 weeks.  Please cut back on your wine intake--maximum of 1 glass daily.  Not drinking daily will help you also lose weight.

## 2020-12-04 ENCOUNTER — Encounter: Payer: Self-pay | Admitting: Family Medicine

## 2020-12-04 ENCOUNTER — Ambulatory Visit (INDEPENDENT_AMBULATORY_CARE_PROVIDER_SITE_OTHER): Payer: Medicare Other | Admitting: Family Medicine

## 2020-12-04 ENCOUNTER — Other Ambulatory Visit: Payer: Self-pay

## 2020-12-04 VITALS — BP 150/74 | HR 78 | Temp 97.1°F | Wt 207.7 lb

## 2020-12-04 DIAGNOSIS — E559 Vitamin D deficiency, unspecified: Secondary | ICD-10-CM

## 2020-12-04 DIAGNOSIS — I4891 Unspecified atrial fibrillation: Secondary | ICD-10-CM

## 2020-12-04 DIAGNOSIS — Z7901 Long term (current) use of anticoagulants: Secondary | ICD-10-CM

## 2020-12-04 DIAGNOSIS — F411 Generalized anxiety disorder: Secondary | ICD-10-CM

## 2020-12-04 DIAGNOSIS — I10 Essential (primary) hypertension: Secondary | ICD-10-CM

## 2020-12-04 MED ORDER — LOSARTAN POTASSIUM 50 MG PO TABS: 50.0000 mg | ORAL_TABLET | Freq: Every day | ORAL | 0 refills | Status: DC

## 2020-12-04 NOTE — Patient Instructions (Addendum)
Blood pressures at home are above goal (and it is higher in the office). Increase your losartan to 50mg . You can take 2 of the 25mg  tablets together to use up what you have left.  We are sending in the prescription for the 50mg  tablets today.. Monitor your blood pressure daily and record the values. Bring the list with you to your visit in 3 weeks.  If you're feeling dizzy, if your blood pressure is under 110 and you don't feel good, then we should go back to the 25mg  dose and call us.  We will want to see you back here in the office in about 1 month (3-4 weeks) to do your labwork and check blood pressure in the office. The potassium and kidneys need to be checked when the losartan dose is raised--so we are postponing the labs from today until next month so we don't have to stick you twice.  Pneumovax (one of the pneumonia vaccines) is recommended (you have only had prevnar-13).  You had said you wanted to come with your daughter (her driving) to get this vaccine.  You can get this from the pharmacy, if that is more convenient, vs scheduling a nurse visit, or having her come with you next month. You want to have this separated from any other vaccine by 2 weeks.  Please cut back on your wine intake--maximum of 1 glass daily.  Not drinking daily will help you also lose weight.

## 2020-12-24 NOTE — Progress Notes (Addendum)
Chief Complaint  Patient presents with   Hypertension    Follow up on blood pressure.    Patient presents for follow-up on hypertension.  She was seen 3 weeks ago with home BP's running above goal (138-140/70-78 at home, higher at doctor visits).  Her losartan dose was increased to 50mg  daily, continues on [place]. She is tolerating this without side effects.  She denies headaches, dizziness, chest pain, palpitations.  Since dose was increased, BP's are running 121-141/61-81, pulse 75-93. Mostly 120-130's/70's.  Only once 141/81  She has [facility], now that her finger has healed.   PMH, PSH, SH reviewed  Outpatient Encounter Medications as of 12/25/2020  Medication Sig Note   apixaban (ELIQUIS) 5 MG TABS tablet Take 1 tablet (5 mg total) by mouth 2 (two) times daily.    Cholecalciferol (VITAMIN D3) 2000 units TABS Take 2,000 Units by mouth daily.     citalopram (CELEXA) 20 MG tablet Take 1 tablet by mouth once daily    diltiazem (CARTIA XT) 180 MG 24 hr capsule TAKE 1 CAPSULE BY MOUTH ONCE DAILY - MEDICATION TO CONTROL HEART RATE    losartan (COZAAR) 50 MG tablet Take 1 tablet (50 mg total) by mouth daily.    Multiple Vitamins-Minerals (MULTIVITAMIN WITH MINERALS) tablet Take 1 tablet by mouth daily.    diphenhydrAMINE (BENADRYL) 25 MG tablet Take 25 mg by mouth every 6 (six) hours as needed for itching or allergies (ALLERGY VACCINE).  (Patient not taking: Reported on 12/25/2020) 11/13/2019: Takes on the day of her allergy shots   fexofenadine (ALLEGRA) 180 MG tablet Take 180 mg by mouth daily as needed for allergies (ALLERGY VACINE).  (Patient not taking: Reported on 12/25/2020) 11/13/2019: Takes on the day of her allergy shots   No facility-administered encounter medications on file as of 12/25/2020.   Allergies  Allergen Reactions   Bee Venom Anaphylaxis   Epinephrine Other (See Comments)    High pulse rate.   Sulfa Antibiotics Nausea And Vomiting   Tetanus  Toxoids Swelling and Other (See Comments)    Entire arm swelled   Vancomycin Nausea And Vomiting   Adhesive [Tape] Rash and Other (See Comments)    Her skin peels off.   Influenza Vaccine Live Swelling and Rash   Ivp Dye [Iodinated Diagnostic Agents] Rash   Zyloprim [Allopurinol] Rash    ROS:  No fever, chills, URI symptoms, headaches, dizziness, chest pain, shortness of breath, palpitations. No nausea, vomiting, bowel changes. No edema.  No urinary complaints. No bleeding. Moods are good.    PHYSICAL EXAM:  BP 132/80    Pulse 68    Ht 5\' 2"  (1.575 m)    Wt 207 lb 12.8 oz (94.3 kg)    BMI 38.01 kg/m   Wt Readings from Last 3 Encounters:  12/25/20 207 lb 12.8 oz (94.3 kg)  12/04/20 207 lb 11.2 oz (94.2 kg)  09/30/20 207 lb 3.2 oz (94 kg)    Well developed, pleasant, obese female in no distress. HEENT: PERRL, EOMI, conjunctiva and sclera are clear. Wearing mask due to COVID-19 pandemic. Neck: no lymphadenopathy, thyromegaly or mass Heart: regular rate and rhythm, no murmur noted. Lungs: clear bilaterally Extremities: no edema, normal pulses. Psych: normal mood, affect, hygiene and grooming Neuro: alert and oriented, normal gait.   ASSESSMENT/PLAN:  Essential hypertension - BP well controlled. Cont 50mg  losartan and Cartia - Plan: Basic metabolic panel  Medication monitoring encounter - Plan: Basic metabolic panel  Class 2 severe  obesity due to excess calories with serious comorbidity and body mass index (BMI) of 38.0 to 38.9 in adult [facility]) - comorbidities include HTN, HLD, OA. Briefly counseled on risks of obesity, wt loss recommended, diet/exercise   Pt came along (daughter had jury duty), so unable to give pneumovax today.  F/u as scheduled in July.

## 2020-12-25 ENCOUNTER — Other Ambulatory Visit: Payer: Self-pay

## 2020-12-25 ENCOUNTER — Ambulatory Visit (INDEPENDENT_AMBULATORY_CARE_PROVIDER_SITE_OTHER): Payer: Medicare Other | Admitting: Family Medicine

## 2020-12-25 ENCOUNTER — Encounter: Payer: Self-pay | Admitting: Family Medicine

## 2020-12-25 VITALS — BP 132/80 | HR 68 | Ht 62.0 in | Wt 207.8 lb

## 2020-12-25 DIAGNOSIS — Z6838 Body mass index (BMI) 38.0-38.9, adult: Secondary | ICD-10-CM | POA: Diagnosis not present

## 2020-12-25 DIAGNOSIS — E66812 Obesity, class 2: Secondary | ICD-10-CM

## 2020-12-25 DIAGNOSIS — Z5181 Encounter for therapeutic drug level monitoring: Secondary | ICD-10-CM

## 2020-12-25 DIAGNOSIS — I10 Essential (primary) hypertension: Secondary | ICD-10-CM | POA: Diagnosis not present

## 2020-12-25 NOTE — Patient Instructions (Signed)
Continue the losartan 50mg .  Your blood pressure is good. Try and get 150 minutes of exercise each week, limit portion and work on weight loss as we have previously discussed.  Call the pharmacy when refills are needed (shouldn't be needed until May).

## 2020-12-26 LAB — BASIC METABOLIC PANEL
BUN/Creatinine Ratio: 11 — ABNORMAL LOW (ref 12–28)
BUN: 10 mg/dL (ref 8–27)
CO2: 21 mmol/L (ref 20–29)
Calcium: 9.5 mg/dL (ref 8.7–10.3)
Chloride: 96 mmol/L (ref 96–106)
Creatinine, Ser: 0.93 mg/dL (ref 0.57–1.00)
GFR calc Af Amer: 67 mL/min/{1.73_m2} (ref >59)
GFR calc non Af Amer: 58 mL/min/{1.73_m2} — ABNORMAL LOW (ref >59)
Glucose: 91 mg/dL (ref 65–99)
Potassium: 4.7 mmol/L (ref 3.5–5.2)
Sodium: 134 mmol/L (ref 134–144)

## 2020-12-31 ENCOUNTER — Other Ambulatory Visit: Payer: Self-pay | Admitting: Family Medicine

## 2020-12-31 DIAGNOSIS — F411 Generalized anxiety disorder: Secondary | ICD-10-CM

## 2020-12-31 NOTE — Telephone Encounter (Signed)
[facility] is requesting to fill pt celexa. Please advise [facility] [place]

## 2021-01-02 ENCOUNTER — Ambulatory Visit (INDEPENDENT_AMBULATORY_CARE_PROVIDER_SITE_OTHER): Payer: Medicare Other

## 2021-01-02 ENCOUNTER — Other Ambulatory Visit: Payer: Self-pay

## 2021-01-02 DIAGNOSIS — T63441D Toxic effect of venom of bees, accidental (unintentional), subsequent encounter: Secondary | ICD-10-CM | POA: Diagnosis not present

## 2021-02-27 ENCOUNTER — Other Ambulatory Visit: Payer: Self-pay

## 2021-02-27 ENCOUNTER — Ambulatory Visit (INDEPENDENT_AMBULATORY_CARE_PROVIDER_SITE_OTHER): Payer: Medicare Other | Admitting: *Deleted

## 2021-02-27 DIAGNOSIS — T63441D Toxic effect of venom of bees, accidental (unintentional), subsequent encounter: Secondary | ICD-10-CM | POA: Diagnosis not present

## 2021-02-27 DIAGNOSIS — J309 Allergic rhinitis, unspecified: Secondary | ICD-10-CM

## 2021-03-10 ENCOUNTER — Telehealth: Payer: Self-pay

## 2021-03-10 DIAGNOSIS — I10 Essential (primary) hypertension: Secondary | ICD-10-CM

## 2021-03-10 MED ORDER — LOSARTAN POTASSIUM 50 MG PO TABS: 50.0000 mg | ORAL_TABLET | Freq: Every day | ORAL | 0 refills | Status: DC

## 2021-03-10 NOTE — Telephone Encounter (Signed)
Received fax from [facility] for a refill on the pts. Losartan pt. Last apt was 12/25/20 and next apt was 05/29/21.

## 2021-03-10 NOTE — Telephone Encounter (Signed)
Done

## 2021-03-12 ENCOUNTER — Other Ambulatory Visit: Payer: Self-pay | Admitting: Family Medicine

## 2021-03-12 DIAGNOSIS — I10 Essential (primary) hypertension: Secondary | ICD-10-CM

## 2021-04-24 ENCOUNTER — Other Ambulatory Visit: Payer: Self-pay

## 2021-04-24 ENCOUNTER — Ambulatory Visit (INDEPENDENT_AMBULATORY_CARE_PROVIDER_SITE_OTHER): Payer: Medicare Other | Admitting: *Deleted

## 2021-04-24 DIAGNOSIS — T63441D Toxic effect of venom of bees, accidental (unintentional), subsequent encounter: Secondary | ICD-10-CM

## 2021-04-29 ENCOUNTER — Encounter: Payer: Self-pay | Admitting: Cardiology

## 2021-04-29 ENCOUNTER — Other Ambulatory Visit: Payer: Self-pay

## 2021-04-29 ENCOUNTER — Ambulatory Visit: Payer: Medicare Other | Admitting: Cardiology

## 2021-04-29 VITALS — BP 138/80 | HR 76 | Ht 64.0 in | Wt 202.4 lb

## 2021-04-29 DIAGNOSIS — I10 Essential (primary) hypertension: Secondary | ICD-10-CM

## 2021-04-29 DIAGNOSIS — I4891 Unspecified atrial fibrillation: Secondary | ICD-10-CM

## 2021-04-29 NOTE — Progress Notes (Signed)
Clinical Summary Ms. Dalgleish is a 80 y.o.female seen today for follow up of the following medical problems.   1. Afib   - No recent palpitations - compliant with meds. No bleeding on eliquis      2. 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 Shaune Leeks)   Colon polyps    Dr. Oletta Lamas   Colon polyps    path was small leiomyoma; tubular adenoma 12/2017   Constipation    Gout    history of ([id number])   Hemorrhoids    History of IBS    Hx of pelvic mass    complex, benign pathology   Hyperlipidemia    elevated trigs and LDL   Hypertension    Hyponatremia    mild   Osteopenia    mild (T-1.3 in 01/2014)   Shingles 02/2015     Allergies  Allergen Reactions   Bee Venom Anaphylaxis   Epinephrine Other (See Comments)    High pulse rate.   Sulfa Antibiotics Nausea And Vomiting   Tetanus Toxoids Swelling and Other (See Comments)    Entire arm swelled   Vancomycin Nausea And Vomiting   Adhesive [Tape] Rash and Other (See Comments)    Her skin peels off.   Influenza Vaccine Live Swelling and Rash   Ivp Dye [Iodinated Diagnostic Agents] Rash   Zyloprim [Allopurinol] Rash     Current Outpatient Medications  Medication Sig Dispense Refill   apixaban (ELIQUIS) 5 MG TABS tablet Take 1 tablet (5 mg total) by mouth 2 (two) times daily. 180 tablet 3   Cholecalciferol (VITAMIN D3) 2000 units TABS Take 2,000 Units by mouth daily.      citalopram (CELEXA) 20 MG tablet Take 1 tablet by mouth once daily 90 tablet 1   diltiazem (CARTIA XT) 180 MG 24 hr capsule TAKE 1 CAPSULE BY MOUTH ONCE DAILY - MEDICATION TO CONTROL HEART RATE 90 capsule 3   diphenhydrAMINE (BENADRYL) 25 MG tablet Take 25  mg by mouth every 6 (six) hours as needed for itching or allergies (ALLERGY VACCINE).  (Patient not taking: Reported on 12/25/2020)     fexofenadine (ALLEGRA) 180 MG tablet Take 180 mg by mouth daily as needed for allergies (ALLERGY VACINE).  (Patient not taking: Reported on 12/25/2020)     losartan (COZAAR) 50 MG tablet Take 1 tablet (50 mg total) by mouth daily. 90 tablet 0   Multiple Vitamins-Minerals (MULTIVITAMIN WITH MINERALS) tablet Take 1 tablet by mouth daily.     No current facility-administered medications for this visit.     Past Surgical History:  Procedure Laterality Date   ABDOMINAL HYSTERECTOMY  1970   for perforated uterus from IUD   BILATERAL SALPINGOOPHORECTOMY  2005   benign tumor/cyst (Dr. Aldean Ast)   COLONOSCOPY N/A 12/31/2017   Procedure: COLONOSCOPY;  Surgeon: Danie Binder, MD;  Location: [facility];  Service: Endoscopy;  Laterality: N/A;  10:30am   COLONOSCOPY N/A 05/23/2018   Procedure: COLONOSCOPY;  Surgeon: Danie Binder, MD;  Location: [facility];  Service: Endoscopy;  Laterality: N/A;  8:30am  POLYPECTOMY  05/23/2018   Procedure: POLYPECTOMY;  Surgeon: Danie Binder, MD;  Location: [facility];  Service: Endoscopy;;  ascending,hepatic, sigmoid,transverse     Allergies  Allergen Reactions   Bee Venom Anaphylaxis   Epinephrine Other (See Comments)    High pulse rate.   Sulfa Antibiotics Nausea And Vomiting   Tetanus Toxoids Swelling and Other (See Comments)    Entire arm swelled   Vancomycin Nausea And Vomiting   Adhesive [Tape] Rash and Other (See Comments)    Her skin peels off.   Influenza Vaccine Live Swelling and Rash   Ivp Dye [Iodinated Diagnostic Agents] Rash   Zyloprim [Allopurinol] Rash      Family History  Problem Relation Age of Onset   Hypertension Mother    Heart disease Mother        MI   Dementia Mother    Cancer Father        stomach   Cancer Brother        multiple myeloma   Healthy Daughter    Healthy  Daughter    Healthy Daughter    Diabetes Neg Hx    Breast cancer Neg Hx    Colon cancer Neg Hx      Social History Ms. Prather reports that she has never smoked. She has never used smokeless tobacco. Ms. Iacobucci reports current alcohol use of about 14.0 standard drinks of alcohol per week.   Review of Systems CONSTITUTIONAL: No weight loss, fever, chills, weakness or fatigue.  HEENT: Eyes: No visual loss, blurred vision, double vision or yellow sclerae.No hearing loss, sneezing, congestion, runny nose or sore throat.  SKIN: No rash or itching.  CARDIOVASCULAR: per hpi RESPIRATORY: No shortness of breath, cough or sputum.  GASTROINTESTINAL: No anorexia, nausea, vomiting or diarrhea. No abdominal pain or blood.  GENITOURINARY: No burning on urination, no polyuria NEUROLOGICAL: No headache, dizziness, syncope, paralysis, ataxia, numbness or tingling in the extremities. No change in bowel or bladder control.  MUSCULOSKELETAL: No muscle, back pain, joint pain or stiffness.  LYMPHATICS: No enlarged nodes. No history of splenectomy.  PSYCHIATRIC: No history of depression or anxiety.  ENDOCRINOLOGIC: No reports of sweating, cold or heat intolerance. No polyuria or polydipsia.  [name] Kitchen   Physical Examination Today's Vitals   04/29/21 0907  BP: 138/80  Pulse: 76  SpO2: 96%  Weight: 202 lb 6.4 oz (91.8 kg)  Height: '5\' 4"'  (1.626 m)   Body mass index is 34.74 kg/m.  Gen: resting comfortably, no acute distress HEENT: no scleral icterus, pupils equal round and reactive, no palptable cervical adenopathy,  CV: RRR, no m/r/g, no jvd Resp: Clear to auscultation bilaterally GI: abdomen is soft, non-tender, non-distended, normal bowel sounds, no hepatosplenomegaly MSK: extremities are warm, no edema.  Skin: warm, no rash Neuro:  no focal deficits Psych: appropriate affect   Diagnostic Studies  Jan 2016 echo Study Conclusions  - Left ventricle: The cavity size was normal. Wall  thickness was   increased in a pattern of moderate LVH. Systolic function was   vigorous. The estimated ejection fraction was in the range of 65%   to 70%. Diastolic function is abnormal, indeterminant grade. Wall   motion was normal; there were no regional wall motion   abnormalities. - Aortic valve: Mildly calcified annulus. Trileaflet; mildly   thickened leaflets. - Mitral valve: Mildly calcified annulus. Mildly thickened leaflets       Assessment and Plan  1. Afib - no symptoms, continue current meds including  anticoag.      2. HTN - home numbers are essentially at goal, continue current meds        Arnoldo Lenis, M.D.

## 2021-04-29 NOTE — Patient Instructions (Signed)
Medication Instructions:  Your physician recommends that you continue on your current medications as directed. Please refer to the Current Medication list given to you today.  *If you need a refill on your cardiac medications before your next appointment, please call your pharmacy*   Lab Work: None If you have labs (blood work) drawn today and your tests are completely normal, you will receive your results only by: [place] (if you have MyChart) OR A paper copy in the mail If you have any lab test that is abnormal or we need to change your treatment, we will call you to review the results.   Testing/Procedures: None   Follow-Up: At [facility], you and your health needs are our priority.  As part of our continuing mission to provide you with exceptional heart care, we have created designated Provider Care Teams.  These Care Teams include your primary Cardiologist (physician) and Advanced Practice Providers (APPs -  Physician Assistants and Nurse Practitioners) who all work together to provide you with the care you need, when you need it.  We recommend signing up for the patient portal called "MyChart".  Sign up information is provided on this After Visit Summary.  MyChart is used to connect with patients for Virtual Visits (Telemedicine).  Patients are able to view lab/test results, encounter notes, upcoming appointments, etc.  Non-urgent messages can be sent to your provider as well.   To learn more about what you can do with MyChart, go to [email or web address].    Your next appointment:   12 month(s)  The format for your next appointment:   In Person  Provider:   Carlyle Dolly, MD   Other Instructions

## 2021-05-08 DIAGNOSIS — L430 Hypertrophic lichen planus: Secondary | ICD-10-CM | POA: Diagnosis not present

## 2021-05-08 DIAGNOSIS — X32XXXD Exposure to sunlight, subsequent encounter: Secondary | ICD-10-CM | POA: Diagnosis not present

## 2021-05-08 DIAGNOSIS — L570 Actinic keratosis: Secondary | ICD-10-CM | POA: Diagnosis not present

## 2021-05-08 DIAGNOSIS — D225 Melanocytic nevi of trunk: Secondary | ICD-10-CM | POA: Diagnosis not present

## 2021-05-28 NOTE — Progress Notes (Signed)
Chief Complaint  Patient presents with   Medicare Wellness    Fasting AWV/CPE, sees eye doctor and was not able to give urine sample. Hands have been peeling x 2 weeks. Would prefer to wait on booster #2.     Monique James is a 80 y.o. female who presents for annual physical exam, Medicare wellness visit and follow-up on chronic medical conditions.   She has the following concerns:  Hands (palms) have been peeling since she used a new soap 2 weeks ago.  It was itchy at first, stopped using it, still peeling, only rarely itchy. Ceravie lotion helps. Hasn't tried HC cream yet.  Anxiety--related to diagnosis of atrial fibrillation.  On citalopram and doing very well, no side effects. Not needing any alprazolam. (She previously weaned off as a trial in the past, but after being off it a week, she had increased anxiety regarding her pulse and checking BP's, realized she was more anxious, resumed citalopram and anxiety has remained well controlled).   Atrial fibrillation--diagnosed 11/2014.  Last saw cardiologist in 04/2021 and no changes were made her her regimen. She continues on Diltiazem and Eliquis. She denies chest pain, palpitations, bleeding, bruising.  CBC's have all been normal, with no e/o anemia. She cannot tell when she is in afib.  Sometimes gets a little winded (especially in the heat), such as carrying in groceries. Never gets palpitations. Lab Results  Component Value Date   WBC 8.6 05/22/2020   HGB 13.6 05/22/2020   HCT 40.4 05/22/2020   MCV 98 (H) 05/22/2020   PLT 326 05/22/2020    Hypertension--Losartan dose was increased to 33m in 12/2020, and she continues on CBrazil  She is compliant with medications and denies side effects. She denies headaches, dizziness, chest pain, palpitations or edema. She tries to follow low sodium diet. BPs are running 130/70's, pulse 70-80 BP Readings from Last 3 Encounters:  04/29/21 138/80  12/25/20 132/80  12/04/20 (!) 150/74    Hyperlipidemia: Controlled by diet, which remains the same. She tries to limit her cheese intake, avoids creamy dressings. She mostly eats chicken and some pork, infrequent red meat. She has had excellent HDL. She denies any significant dietary changes. Epic has her flagged that she has atherosclerotic heart disease documented (and so should be on statin)--chart reviewed, including prev CXR and echo, and I don't see mention of this diagnosis. Lab Results  Component Value Date   CHOL 237 (H) 05/22/2020   HDL 109 05/22/2020   LDLCALC 118 (H) 05/22/2020   TRIG 60 05/22/2020   CHOLHDL 2.2 05/22/2020   Vitamin D deficiency:  Last level was 32.3 in 05/2020.  She is compliant with taking her daily MVI plus D3 2000 IU daily.  (She thinks the bottle is 1000, and she is taking 2).   Allergies:  Takes Allegra and Benadryl when she goes for her shots, and just seasonally just prn (only rarely needs). She continues on allergy shots every 8 weeks.    Colon polyps: On colonoscopy 05/2018, 10 polyps were found, one of which was 165m  Path revealed seven simple adenomas, one serrated adenoma, and 2 hyperplastic polyps. No advanced changes were seen.  GI reported that no repeat colonoscopy is needed (but that her family should have colonoscopies starting at the age of 81  She denies any GI complaints.   Obesity:  Counseled extensively at prior visits. Previously has done WW[employer] MutualShe tries to watch her portions.  She increased her fruit and vegetables. Snacks  some on pretzels, no longer snacks on crackers. She has been going to the [facility] at least 2x/week, but water isn't heated, can only stay in the water 30 minutes.   Immunization History  Administered Date(s) Administered   [employer] Vaccination 06/24/2020, 07/25/2020, 01/17/2021   Pneumococcal Conjugate-13 10/07/2015   Pneumovax, Shingrix have all been discussed in detail.  She is hesitant due to her allergies. She has been willing to consider  Pneumovax, and it was suggested that her daughter bring her for pneumovax (to avoid driving if not feeling well after). She has been coming to her visits by herself Allergic to tetanus, flu shots. Last Pap smear: n/a, s/p hysterectomy   Last mammogram: 11/2020 Last colonoscopy: 05/2018 Last DEXA: 05/2019 T-1.4 at [facility] fem neck, normal FRAX scores. (prior was 01/2014 at [facility].  T-1.3) Ophtho: yearly Dentist: twice yearly   Exercise: water aerobics 2x/week, only lasting about 30 minutes--pool isn't heated and is too cold. Uses weights in the pool.  Not walking due to the heat.     Other doctors caring for patient include: Dentist: Dr. Truman Hayward Ophtho: Dr. Jorja Loa (at [place]) Allergist:  Dr. Ernst Bowler GI: Dr. Barney Drain (retired) Cardiologist: Dr. Harl Bowie Dermatologist: Dr. Nevada Crane    Depression screen: negative Fall screen: none in the last year Functional Status screen: negative (other than forgetting why she walked into a room, nothing significant). Mini-cog screen: norma See epic for full questionnaires.   End of Life Discussion:  Patient has a living will and medical power of attorney (scanned in 11/2019).   PMH, PSH, SH and FH were reviewed and updated.  Outpatient Encounter Medications as of 05/29/2021  Medication Sig Note   apixaban (ELIQUIS) 5 MG TABS tablet Take 1 tablet (5 mg total) by mouth 2 (two) times daily.    Cholecalciferol (VITAMIN D3) 2000 units TABS Take 2,000 Units by mouth daily.     citalopram (CELEXA) 20 MG tablet Take 1 tablet by mouth once daily    diltiazem (CARTIA XT) 180 MG 24 hr capsule TAKE 1 CAPSULE BY MOUTH ONCE DAILY - MEDICATION TO CONTROL HEART RATE    losartan (COZAAR) 50 MG tablet Take 1 tablet (50 mg total) by mouth daily.    Multiple Vitamins-Minerals (MULTIVITAMIN WITH MINERALS) tablet Take 1 tablet by mouth daily.    diphenhydrAMINE (BENADRYL) 25 MG tablet Take 25 mg by mouth every 6 (six) hours as needed for itching or allergies (ALLERGY  VACCINE). (Patient not taking: Reported on 05/29/2021) 11/13/2019: Takes on the day of her allergy shots   fexofenadine (ALLEGRA) 180 MG tablet Take 180 mg by mouth daily as needed for allergies (ALLERGY VACINE). (Patient not taking: Reported on 05/29/2021) 11/13/2019: Takes on the day of her allergy shots   No facility-administered encounter medications on file as of 05/29/2021.   Allergies  Allergen Reactions   Bee Venom Anaphylaxis   Epinephrine Other (See Comments)    High pulse rate.   Sulfa Antibiotics Nausea And Vomiting   Tetanus Toxoids Swelling and Other (See Comments)    Entire arm swelled   Vancomycin Nausea And Vomiting   Adhesive [Tape] Rash and Other (See Comments)    Her skin peels off.   Influenza Vaccine Live Swelling and Rash   Ivp Dye [Iodinated Diagnostic Agents] Rash   Zyloprim [Allopurinol] Rash    ROS: The patient denies anorexia, fever, headaches, vision changes, decreased hearing, ear pain, sore throat, breast concerns, chest pain, palpitations, dizziness, syncope, dyspnea on exertion, cough, swelling, nausea,  vomiting, diarrhea,abdominal pain, melena, hematochezia, hematuria, incontinence, dysuria, vaginal bleeding, discharge, odor or itch, numbness, tingling, weakness, tremor, suspicious skin lesions, abnormal bleeding/bruising, or enlarged lymph nodes. +arthritis in hands, only occasionally painful (weather-dependent), R ring finger getting crooked, and now the L is also. 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 R knee, occasional discomfort with weather changes. Rare heartburn with fatty foods (rare, relieved by Tums).  Some mild constipation, controlled with high fiber diet and stool softeners prn. Anxiety is well controlled, denies depression. Hands peeling/rash, per HPI   PHYSICAL EXAM:  BP 136/78   Pulse 64   Ht 5' 2.5" (1.588 m)   Wt 204 lb 3.2 oz (92.6 kg)   BMI 36.75 kg/m   Wt Readings from Last 3 Encounters:  05/29/21 204 lb 3.2 oz  (92.6 kg)  04/29/21 202 lb 6.4 oz (91.8 kg)  12/25/20 207 lb 12.8 oz (94.3 kg)    General Appearance:    Alert, cooperative, no distress, appears stated age.   Head:    Normocephalic, without obvious abnormality, atraumatic     Eyes:    PERRL, conjunctiva and sclera are clear. EOM's intact, fundi benign   Ears:    Normal TM's and external ear canals     Nose:    Not examined, wearing mask due to COVID-19 pandemic     Throat:    Not examined, wearing mask due to COVID-19 pandemic   Neck:    Supple, no lymphadenopathy; thyroid: no enlargement/ tenderness/nodules; no carotid bruit or JVD     Back:    Spine nontender, no curvature, ROM normal, no CVA tenderness     Lungs:    Clear to auscultation bilaterally without wheezes, rales or ronchi; respirations unlabored     Chest Wall:    No tenderness or deformity     Heart:    Regular rate and rhythm, S1 and S2 normal, no murmur, rub or gallop.   Breast Exam:    No tenderness, masses, or nipple discharge or inversion. No axillary lymphadenopathy     Abdomen:    Soft, non-tender, obese, nondistended, normoactive bowel sounds, no masses, no hepatosplenomegaly     Genitalia:    Normal external genitalia without lesions, mild atrophic changes. BUS and vagina normal; No abnormal vaginal discharge. Bimanual exam was normal, without any masses appreciable, nontender     Rectal:    Normal sphincter tone, no masses. Heme negative stool  Extremities:    No clubbing, cyanosis or edema. Some bony abnormalities c/w arthritis (especially R ring finger, with ulnar deviation of the middle phalanx),  Pulses:    2+ and symmetric all extremities     Skin:    Skin color, texture, turgor normal. Peeling skin on palmar aspects of hands and fingers bilaterally. No erythema, crusting or weeping, nontender, just dry.  Thickened/discolored great toenails bilaterally, with ecchymosis at R great toenail (trauma recently)  Lymph nodes:    Cervical, supraclavicular, and axillary  nodes normal     Neurologic:    Normal strength, sensation and gait; reflexes 2+ and symmetric throughout              Psych:   Normal mood, affect, hygiene and grooming   ASSESSMENT/PLAN:  Annual physical exam  Medicare annual wellness visit, subsequent  Atrial fibrillation with rapid ventricular response ([place]) - stable on current regimen. in NSR today.   Anticoagulant long-term use - no bleeding or complications - Plan: CBC with Differential/Platelet  Vitamin D deficiency -  cont daily supplement  Pure hypercholesterolemia - excellent HDL. No known atherosclerotic disease, statin not needed  Osteopenia, unspecified location - mild. Cont Ca, D, weight-bearing exercise  Generalized anxiety disorder - well controlled; had recurrent anxiety when tapered off. Continue long-term - Plan: citalopram (CELEXA) 20 MG tablet  Essential hypertension - well controlled - Plan: Comprehensive metabolic panel, losartan (COZAAR) 50 MG tablet  Contact dermatitis, unspecified contact dermatitis type, unspecified trigger - suspect allergic reaction to new soap. To use HC BID-TID and continue moisturizer  Class 2 severe obesity due to excess calories with serious comorbidity and body mass index (BMI) of 36.0 to 36.9 in adult (HCC) - HTN, afib as comorbid. Counseled re risks, healthy diet, portions, exercise, wt loss   CBC, c-met, Forward labs to Dr. Harl Bowie  Discussed monthly self breast exams and yearly mammograms; at least 30 minutes of aerobic activity at least 5 days/week, weight-bearing exercise 2-3x/wk; proper sunscreen use reviewed; healthy diet, including goals of calcium and vitamin D intake and alcohol recommendations (less than or equal to 1 drink/day--encouraged again to cut back) reviewed; regular seatbelt use; changing batteries in smoke detectors. Immunization recommendations discussed--allergic to flu shots. Shingrix and pneumovax recommended. Can get both at pharmacy, or pneumovax at our  office--rec dtr to drive (due to patient's anxiety/fear of allergic reaction; she has epi-pen)   MOST form reviewed, unchanged. Full Code, Full Care  F/u 1 year, sooner prn if elevated BP's or other concerns    Medicare Attestation I have personally reviewed: The patient's medical and social history Their use of alcohol, tobacco or illicit drugs Their current medications and supplements The patient's functional ability including ADLs,fall risks, home safety risks, cognitive, and hearing and visual impairment Diet and physical activities Evidence for depression or mood disorders  The patient's weight, height, BMI have been recorded in the chart.  I have made referrals, counseling, and provided education to the patient based on review of the above and I have provided the patient with a written personalized care plan for preventive services.

## 2021-05-28 NOTE — Patient Instructions (Addendum)
  HEALTH MAINTENANCE RECOMMENDATIONS:  It is recommended that you get at least 30 minutes of aerobic exercise at least 5 days/week (for weight loss, you may need as much as 60-90 minutes). This can be any activity that gets your heart rate up. This can be divided in 10-15 minute intervals if needed, but try and build up your endurance at least once a week.  Weight bearing exercise is also recommended twice weekly.  Eat a healthy diet with lots of vegetables, fruits and fiber.  "Colorful" foods have a lot of vitamins (ie green vegetables, tomatoes, red peppers, etc).  Limit sweet tea, regular sodas and alcoholic beverages, all of which has a lot of calories and sugar.  Up to 1 alcoholic drink daily may be beneficial for women (unless trying to lose weight, watch sugars).  Drink a lot of water.  Calcium recommendations are 1200-1500 mg daily (1500 mg for postmenopausal women or women without ovaries), and vitamin D 1000 IU daily.  This should be obtained from diet and/or supplements (vitamins), and calcium should not be taken all at once, but in divided doses.  Monthly self breast exams and yearly mammograms for women over the age of 76 is recommended.  Sunscreen of at least SPF 30 should be used on all sun-exposed parts of the skin when outside between the hours of 10 am and 4 pm (not just when at beach or pool, but even with exercise, golf, tennis, and yard work!)  Use a sunscreen that says "broad spectrum" so it covers both UVA and UVB rays, and make sure to reapply every 1-2 hours.  Remember to change the batteries in your smoke detectors when changing your clock times in the spring and fall. Carbon monoxide detectors are recommended for your home.  Use your seat belt every time you are in a car, and please drive safely and not be distracted with cell phones and texting while driving.   Monique James , Thank you for taking time to come for your Medicare Wellness Visit. I appreciate your ongoing  commitment to your health goals. Please review the following plan we discussed and let me know if I can assist you in the future.   This is a list of the screening recommended for you and due dates:  Health Maintenance  Topic Date Due   Zoster (Shingles) Vaccine (1 of 2) Never done   Pneumonia vaccines (2 of 2 - PPSV23) 10/06/2016   COVID-19 Vaccine (3 - Booster for Moderna series) 12/25/2020   DEXA scan (bone density measurement)  Completed   HPV Vaccine  Aged Out   Use hydrocortisone 2-3 times daily to the rash on the hands, and continue to moisturize well.  (As well as avoid further contact with the substance you're likely reacting to).  Pneumovax and shingles vaccines are recommended.  Have your daughter drive you, and you could get at a pharmacy, if that would be easier (you have to for the shingles vaccine, pneumovax could be given in our office).

## 2021-05-29 ENCOUNTER — Ambulatory Visit (INDEPENDENT_AMBULATORY_CARE_PROVIDER_SITE_OTHER): Payer: Medicare Other | Admitting: Family Medicine

## 2021-05-29 ENCOUNTER — Encounter: Payer: Self-pay | Admitting: Family Medicine

## 2021-05-29 VITALS — BP 136/78 | HR 64 | Ht 62.5 in | Wt 204.2 lb

## 2021-05-29 DIAGNOSIS — E559 Vitamin D deficiency, unspecified: Secondary | ICD-10-CM | POA: Diagnosis not present

## 2021-05-29 DIAGNOSIS — Z0000 Encounter for general adult medical examination without abnormal findings: Secondary | ICD-10-CM

## 2021-05-29 DIAGNOSIS — F411 Generalized anxiety disorder: Secondary | ICD-10-CM

## 2021-05-29 DIAGNOSIS — Z7901 Long term (current) use of anticoagulants: Secondary | ICD-10-CM | POA: Diagnosis not present

## 2021-05-29 DIAGNOSIS — I4891 Unspecified atrial fibrillation: Secondary | ICD-10-CM

## 2021-05-29 DIAGNOSIS — E7800 Pure hypercholesterolemia, unspecified: Secondary | ICD-10-CM | POA: Diagnosis not present

## 2021-05-29 DIAGNOSIS — L259 Unspecified contact dermatitis, unspecified cause: Secondary | ICD-10-CM | POA: Diagnosis not present

## 2021-05-29 DIAGNOSIS — I10 Essential (primary) hypertension: Secondary | ICD-10-CM

## 2021-05-29 DIAGNOSIS — M8580 Other specified disorders of bone density and structure, unspecified site: Secondary | ICD-10-CM

## 2021-05-29 DIAGNOSIS — E66812 Obesity, class 2: Secondary | ICD-10-CM

## 2021-05-29 DIAGNOSIS — Z6836 Body mass index (BMI) 36.0-36.9, adult: Secondary | ICD-10-CM

## 2021-05-29 MED ORDER — LOSARTAN POTASSIUM 50 MG PO TABS: 50.0000 mg | ORAL_TABLET | Freq: Every day | ORAL | 3 refills | Status: DC

## 2021-05-29 MED ORDER — CITALOPRAM HYDROBROMIDE 20 MG PO TABS: 20.0000 mg | ORAL_TABLET | Freq: Every day | ORAL | 3 refills | Status: DC

## 2021-05-30 LAB — COMPREHENSIVE METABOLIC PANEL
ALT: 9 IU/L (ref 0–32)
AST: 14 IU/L (ref 0–40)
Albumin/Globulin Ratio: 1.9 (ref 1.2–2.2)
Albumin: 4.4 g/dL (ref 3.6–4.6)
Alkaline Phosphatase: 70 IU/L (ref 44–121)
BUN/Creatinine Ratio: 14 (ref 12–28)
BUN: 11 mg/dL (ref 8–27)
Bilirubin Total: 0.5 mg/dL (ref 0.0–1.2)
CO2: 23 mmol/L (ref 20–29)
Calcium: 9.6 mg/dL (ref 8.7–10.3)
Chloride: 93 mmol/L — ABNORMAL LOW (ref 96–106)
Creatinine, Ser: 0.77 mg/dL (ref 0.57–1.00)
Globulin, Total: 2.3 g/dL (ref 1.5–4.5)
Glucose: 92 mg/dL (ref 65–99)
Potassium: 4.5 mmol/L (ref 3.5–5.2)
Sodium: 130 mmol/L — ABNORMAL LOW (ref 134–144)
Total Protein: 6.7 g/dL (ref 6.0–8.5)
eGFR: 77 mL/min/{1.73_m2} (ref >59)

## 2021-05-30 LAB — CBC WITH DIFFERENTIAL/PLATELET
Basophils Absolute: 0.1 10*3/uL (ref 0.0–0.2)
Basos: 1 %
EOS (ABSOLUTE): 0 10*3/uL (ref 0.0–0.4)
Eos: 0 %
Hematocrit: 38.2 % (ref 34.0–46.6)
Hemoglobin: 13.1 g/dL (ref 11.1–15.9)
Immature Grans (Abs): 0 10*3/uL (ref 0.0–0.1)
Immature Granulocytes: 0 %
Lymphocytes Absolute: 2.2 10*3/uL (ref 0.7–3.1)
Lymphs: 30 %
MCH: 32.5 pg (ref 26.6–33.0)
MCHC: 34.3 g/dL (ref 31.5–35.7)
MCV: 95 fL (ref 79–97)
Monocytes Absolute: 0.6 10*3/uL (ref 0.1–0.9)
Monocytes: 9 %
Neutrophils Absolute: 4.3 10*3/uL (ref 1.4–7.0)
Neutrophils: 60 %
Platelets: 326 10*3/uL (ref 150–450)
RBC: 4.03 x10E6/uL (ref 3.77–5.28)
RDW: 11.7 % (ref 11.7–15.4)
WBC: 7.1 10*3/uL (ref 3.4–10.8)

## 2021-06-19 ENCOUNTER — Ambulatory Visit (INDEPENDENT_AMBULATORY_CARE_PROVIDER_SITE_OTHER): Payer: Medicare Other | Admitting: *Deleted

## 2021-06-19 ENCOUNTER — Other Ambulatory Visit: Payer: Self-pay

## 2021-06-19 DIAGNOSIS — T63441D Toxic effect of venom of bees, accidental (unintentional), subsequent encounter: Secondary | ICD-10-CM | POA: Diagnosis not present

## 2021-08-14 ENCOUNTER — Encounter: Payer: Self-pay | Admitting: Allergy & Immunology

## 2021-08-14 ENCOUNTER — Ambulatory Visit: Payer: Medicare Other | Admitting: Allergy & Immunology

## 2021-08-14 ENCOUNTER — Ambulatory Visit (INDEPENDENT_AMBULATORY_CARE_PROVIDER_SITE_OTHER): Payer: Medicare Other | Admitting: *Deleted

## 2021-08-14 ENCOUNTER — Other Ambulatory Visit: Payer: Self-pay

## 2021-08-14 VITALS — BP 150/72 | HR 82 | Temp 97.9°F | Ht 64.0 in | Wt 203.0 lb

## 2021-08-14 DIAGNOSIS — T63441D Toxic effect of venom of bees, accidental (unintentional), subsequent encounter: Secondary | ICD-10-CM

## 2021-08-14 NOTE — Progress Notes (Signed)
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  I did offer her the option of spacing out her venom immunotherapy shots even more to 10 to 12 weeks, but she is fine with coming in every 8 weeks.  She said this has been working for a while and she rather just keep same.  That is completely understandable.  Plan/Recommendations:   1. Venom hypersensitivity  - Continue with allergy shots at the same schedule. [name] is up to date. - It seems that you are doing very well with the current schedule.   2. Return in about 1 year (around 08/14/2022).    Subjective:   Monique James is a 80 y.o. 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  Monique James is a 80 y.o. female presenting for a follow up visit. She was last seen in September 2021. At that time, she was doing well on her venom immunotherapy. We provided her with a sample of AuviQ to have on hand if needed.  Since the last visit, she has done well.   Her shots have done well. She has not had issues with them. Sometimes they will  be red for whatever reason. She never takes anything for it and it is gone by the next day.    She saw a 36yo granddaughter recently from [place] that they have not seen in 3 years. She had a fantastic time with her. One of her daughters who is more local continues to refuse the COVID19 vaccine. This stresses [name] out quite a bit.   She does have an AuviQ. She thinks that it is up to date. We gave it to her last year.   She is on Eliquis and this is rather expensive. She overall does not have problems affording her medications.   Otherwise, there have been no changes to her past medical history, surgical history, family history, or social history.    Review of Systems  Constitutional: Negative.  Negative for fever, malaise/fatigue and weight loss.  HENT: Negative.  Negative for congestion, ear discharge and ear pain.   Eyes:  Negative for pain, discharge and redness.  Respiratory:  Negative for cough, sputum production, shortness of breath and wheezing.   Cardiovascular: Negative.  Negative for chest pain and palpitations.  Gastrointestinal:  Negative for abdominal pain and heartburn.  Skin: Negative.  Negative for itching and rash.  Neurological:  Negative for dizziness and headaches.  Endo/Heme/Allergies:  Negative for environmental allergies. Does not bruise/bleed easily.      Objective:   Blood pressure (!) 150/72,  pulse 82, temperature 97.9 F (36.6 C), height 5\' 4"  (1.626 m), weight 203 lb (92.1 kg), SpO2 94 %. Body mass index is 34.84 kg/m.   Physical Exam:  Physical Exam Constitutional:      Appearance: She is well-developed.  HENT:     Head: Normocephalic and atraumatic.     Right Ear: Tympanic membrane, ear canal and external ear normal.     Left Ear: Tympanic membrane, ear canal and external ear normal.     Nose: No nasal deformity, septal deviation, mucosal edema or rhinorrhea.     Right Turbinates: Not enlarged or swollen.     Left Turbinates: Not  enlarged or swollen.     Right Sinus: No maxillary sinus tenderness or frontal sinus tenderness.     Left Sinus: No maxillary sinus tenderness or frontal sinus tenderness.     Mouth/Throat:     Mouth: Mucous membranes are not pale and not dry.     Pharynx: Uvula midline.  Eyes:     General: Lids are normal. No allergic shiner.       Right eye: No discharge.        Left eye: No discharge.     Conjunctiva/sclera: Conjunctivae normal.     Right eye: Right conjunctiva is not injected. No chemosis.    Left eye: Left conjunctiva is not injected. No chemosis.    Pupils: Pupils are equal, round, and reactive to light.  Cardiovascular:     Rate and Rhythm: Normal rate and regular rhythm.     Heart sounds: Normal heart sounds.  Pulmonary:     Effort: Pulmonary effort is normal. No tachypnea, accessory muscle usage or respiratory distress.     Breath sounds: Normal breath sounds. No wheezing, rhonchi or rales.  Chest:     Chest wall: No tenderness.  Lymphadenopathy:     Cervical: No cervical adenopathy.  Skin:    Coloration: Skin is not pale.     Findings: No abrasion, erythema, petechiae or rash. Rash is not papular, urticarial or vesicular.  Neurological:     Mental Status: She is alert.  Psychiatric:        Behavior: Behavior is cooperative.     Diagnostic studies: none     Salvatore Marvel, MD  [facility]

## 2021-08-14 NOTE — Patient Instructions (Addendum)
1. Venom hypersensitivity  - Continue with allergy shots at the same schedule. Monique James is up to date. - It seems that you are doing very well with the current schedule.   2. Return in about 1 year (around 08/14/2022).    Please inform us of any Emergency Department visits, hospitalizations, or changes in symptoms. Call us before going to the ED for breathing or allergy symptoms since we might be able to fit you in for a sick visit. Feel free to contact us anytime with any questions, problems, or concerns.  It was a pleasure to meet you today!  Websites that have reliable patient information: 1. American Academy of Asthma, Allergy, and Immunology: www.aaaai.org 2. Food Allergy Research and Education (FARE): foodallergy.org 3. Mothers of Asthmatics: http://www.asthmacommunitynetwork.org 4. American College of Allergy, Asthma, and Immunology: www.acaai.org   COVID-19 Vaccine Information can be found at: ShippingScam.co.uk For questions related to vaccine distribution or appointments, please email vaccine@[email or web address] or call [phone])[phone].     "Like" [place] on Facebook and Instagram for our latest updates!       Make sure you are registered to vote! If you have moved or changed any of your contact information, you will need to get this updated before voting!  In some cases, you MAY be able to register to vote online: [email or web address]

## 2021-09-01 DIAGNOSIS — D225 Melanocytic nevi of trunk: Secondary | ICD-10-CM | POA: Diagnosis not present

## 2021-09-01 DIAGNOSIS — D485 Neoplasm of uncertain behavior of skin: Secondary | ICD-10-CM | POA: Diagnosis not present

## 2021-10-09 ENCOUNTER — Ambulatory Visit: Payer: Self-pay | Admitting: *Deleted

## 2021-10-09 ENCOUNTER — Other Ambulatory Visit: Payer: Self-pay

## 2021-10-09 ENCOUNTER — Ambulatory Visit (INDEPENDENT_AMBULATORY_CARE_PROVIDER_SITE_OTHER): Payer: Medicare Other | Admitting: *Deleted

## 2021-10-09 DIAGNOSIS — T63441D Toxic effect of venom of bees, accidental (unintentional), subsequent encounter: Secondary | ICD-10-CM | POA: Diagnosis not present

## 2021-10-13 ENCOUNTER — Other Ambulatory Visit (HOSPITAL_COMMUNITY): Payer: Self-pay | Admitting: Emergency Medicine

## 2021-10-13 DIAGNOSIS — Z1231 Encounter for screening mammogram for malignant neoplasm of breast: Secondary | ICD-10-CM

## 2021-10-27 ENCOUNTER — Other Ambulatory Visit: Payer: Self-pay | Admitting: Physician Assistant

## 2021-11-12 ENCOUNTER — Ambulatory Visit (HOSPITAL_COMMUNITY)
Admission: RE | Admit: 2021-11-12 | Discharge: 2021-11-12 | Disposition: A | Discharge status: Home / Self Care | Payer: Medicare Other | Source: Ambulatory Visit | Attending: Emergency Medicine | Admitting: Emergency Medicine

## 2021-11-12 ENCOUNTER — Other Ambulatory Visit: Payer: Self-pay

## 2021-11-12 DIAGNOSIS — Z1231 Encounter for screening mammogram for malignant neoplasm of breast: Secondary | ICD-10-CM | POA: Insufficient documentation

## 2021-11-13 ENCOUNTER — Telehealth: Payer: Self-pay | Admitting: Cardiology

## 2021-11-13 ENCOUNTER — Other Ambulatory Visit: Payer: Self-pay | Admitting: Physician Assistant

## 2021-11-13 NOTE — Telephone Encounter (Signed)
Patient calling the office for samples of medication:   1.  What medication and dosage are you requesting samples for? apixaban (ELIQUIS) 5 MG TABS tablet  2.  Are you currently out of this medication? Pt will be out of this medication tomorrow

## 2021-11-13 NOTE — Telephone Encounter (Signed)
Request for Eliquis 5mg  samples: Indication: Atrial Fib Last office visit: 04/29/21  Zandra Abts MD Scr: 0.77 on 05/29/21 Age: 81 Weight: 91.8kg  Based on above findings Eliquis 5mg  twice daily is the appropriate dose.  OK to give samples if available.

## 2021-11-13 NOTE — Telephone Encounter (Signed)
Informed patient that we currently do not have Eliquis samples.   Stated that she had to pay $369 for 3 mo supply 3 months ago.  Has not had one filled since we are now in a new year.  Suggested she see what cost is now & let us know if she needs further assistance.  Patient verbalized understanding.

## 2021-11-14 NOTE — Telephone Encounter (Signed)
Prescription refill request for Eliquis received. Indication: Afib  Last office visit:6/28/222 ([place])  Scr:  0.77 (05/29/21)  Age: 81 Weight: 92.1kg  Appropriate dose and refill sent to requested pharmacy.

## 2021-12-04 ENCOUNTER — Other Ambulatory Visit: Payer: Self-pay

## 2021-12-04 ENCOUNTER — Ambulatory Visit (INDEPENDENT_AMBULATORY_CARE_PROVIDER_SITE_OTHER): Payer: Medicare Other

## 2021-12-04 DIAGNOSIS — T63441D Toxic effect of venom of bees, accidental (unintentional), subsequent encounter: Secondary | ICD-10-CM

## 2022-01-29 ENCOUNTER — Ambulatory Visit (INDEPENDENT_AMBULATORY_CARE_PROVIDER_SITE_OTHER): Payer: Medicare Other

## 2022-01-29 DIAGNOSIS — T63441D Toxic effect of venom of bees, accidental (unintentional), subsequent encounter: Secondary | ICD-10-CM | POA: Diagnosis not present

## 2022-02-09 ENCOUNTER — Other Ambulatory Visit: Payer: Self-pay | Admitting: Cardiology

## 2022-02-09 NOTE — Telephone Encounter (Signed)
Request for Eliquis '5mg'$  refills ?Indication: Atrial Fib ?Last office visit: 04/29/21  Zandra Abts MD ?Scr: 0.77 on 05/29/21 ?Age: 81 ?Weight: 91.8kg ?  ?Based on above findings Eliquis '5mg'$  twice daily is the appropriate dose.  Refill approved. ?

## 2022-03-19 DIAGNOSIS — D485 Neoplasm of uncertain behavior of skin: Secondary | ICD-10-CM | POA: Diagnosis not present

## 2022-03-19 DIAGNOSIS — L570 Actinic keratosis: Secondary | ICD-10-CM | POA: Diagnosis not present

## 2022-03-19 DIAGNOSIS — D225 Melanocytic nevi of trunk: Secondary | ICD-10-CM | POA: Diagnosis not present

## 2022-03-19 DIAGNOSIS — Z1283 Encounter for screening for malignant neoplasm of skin: Secondary | ICD-10-CM | POA: Diagnosis not present

## 2022-03-19 DIAGNOSIS — X32XXXD Exposure to sunlight, subsequent encounter: Secondary | ICD-10-CM | POA: Diagnosis not present

## 2022-03-26 ENCOUNTER — Ambulatory Visit (INDEPENDENT_AMBULATORY_CARE_PROVIDER_SITE_OTHER): Payer: Medicare Other

## 2022-03-26 DIAGNOSIS — T63441D Toxic effect of venom of bees, accidental (unintentional), subsequent encounter: Secondary | ICD-10-CM

## 2022-04-06 DIAGNOSIS — D485 Neoplasm of uncertain behavior of skin: Secondary | ICD-10-CM | POA: Diagnosis not present

## 2022-04-06 DIAGNOSIS — D225 Melanocytic nevi of trunk: Secondary | ICD-10-CM | POA: Diagnosis not present

## 2022-04-06 DIAGNOSIS — L988 Other specified disorders of the skin and subcutaneous tissue: Secondary | ICD-10-CM | POA: Diagnosis not present

## 2022-04-26 ENCOUNTER — Other Ambulatory Visit: Payer: Self-pay | Admitting: Cardiology

## 2022-05-21 ENCOUNTER — Ambulatory Visit (INDEPENDENT_AMBULATORY_CARE_PROVIDER_SITE_OTHER): Payer: Medicare Other

## 2022-05-21 DIAGNOSIS — T63441D Toxic effect of venom of bees, accidental (unintentional), subsequent encounter: Secondary | ICD-10-CM

## 2022-06-07 NOTE — Progress Notes (Signed)
Chief Complaint  Patient presents with   Annual Exam    Fasting CPE- No concerns. Declines Pneumovax today    Monique James is a 81 y.o. female who presents for annual physical exam, Medicare wellness visit and follow-up on chronic medical conditions.    Anxiety--related to diagnosis of atrial fibrillation.  On citalopram and doing very well, no side effects. Not needing any alprazolam. (Had recurrent anxiety about her pulse/BP upon trial off SSRI in the past). Atrial fib has been more long-standing with no anxiety--willing to re-try a lower dose.   Paroxysmal atrial fibrillation--diagnosed 11/2014.  Last saw cardiologist in 04/2021, scheduled for 06/09/22. She continues on Diltiazem and Eliquis. She denies chest pain, palpitations, bleeding, bruising.  She cannot tell when she is in afib. Sometimes gets a little winded (especially in the heat), such as carrying in groceries. Rare palpitations.  Hypertension--Losartan dose was increased to '50mg'$  in 12/2020, and she continues on [place].  She is compliant with medications and denies side effects. She denies headaches, dizziness, chest pain, palpitations or edema. She tries to follow low sodium diet. BPs are running 112-139/60-82, pulse 70-83.  Mainly 115-130/60's-70's.  BP Readings from Last 3 Encounters:  06/08/22 130/80  08/14/21 (!) 150/72  05/29/21 136/78   Hyperlipidemia: Controlled by diet, which remains the same. She tries to limit her cheese intake. Uses creamy dressing about 50% of the time, but not eating as many salads. She mostly eats chicken and some pork, infrequent red meat. She eats a few eggs/month.   She has had excellent HDL. She denies any significant dietary changes. Epic has her flagged that she has atherosclerotic heart disease documented (and so should be on statin)--chart reviewed, including prev CXR and echo, and I don't see mention of this diagnosis. Lab Results  Component Value Date   CHOL 237 (H) 05/22/2020    HDL 109 05/22/2020   LDLCALC 118 (H) 05/22/2020   TRIG 60 05/22/2020   CHOLHDL 2.2 05/22/2020   Vitamin D deficiency:  Last level was 32.3 in 05/2020.  She is compliant with taking her daily MVI plus D3 2000 IU daily. She is now also taking one serving of Calcium with D daily (unknown dose of D).   Allergies:  Takes Allegra and Benadryl when she goes for her shots, and just seasonally just prn (only rarely needs). She continues on allergy shots..    Colon polyps: On colonoscopy 05/2018, 10 polyps were found, one of which was 18m.  Path revealed seven simple adenomas, one serrated adenoma, and 2 hyperplastic polyps. No advanced changes were seen.  GI reported that no repeat colonoscopy is needed (but that her family should have colonoscopies starting at the age of 480.  She denies any GI complaints.   Obesity: She has lost 9# in the last year.  She switched to snacking on popcorn instead of crackers, chips. She continues to try and eat a lot of fruit/vegetables, salads. Tries to limit her carbs.  Occasionally has a baked potato. No pasta. She tries to watch her portions.   [facility] been [facility], but water isn't heated, can only stay in the water 30 minutes.   Immunization History  Administered Date(s) Administered   MMarriottVaccination 06/24/2020, 07/25/2020, 01/17/2021   Pneumococcal Conjugate-13 10/07/2015   Pneumovax, Shingrix have all been discussed in detail.  She is hesitant due to her allergies. She has been willing to consider Pneumovax, and it was suggested that her daughter bring  her for pneumovax (to avoid driving if not feeling well after). She has been coming to her visits by herself Allergic to tetanus, flu shots. Last Pap smear: n/a, s/p hysterectomy   Last mammogram: 11/2021 Last colonoscopy: 05/2018 Last DEXA: 05/2019 T-1.4 at [facility], normal FRAX scores. (prior was 01/2014 at [facility].  T-1.3) Ophtho: yearly Dentist: twice yearly    Exercise: water aerobics 2x/week, only lasting about 30 minutes--pool isn't heated and is too cold. Uses weights in the pool.  Not walking due to the heat.   Patient Care Team: Rita Ohara, MD as PCP - General (Family Medicine) Harl Bowie Alphonse Guild, MD as PCP - Cardiology (Cardiology) Lendon Colonel, NP as Nurse Practitioner (Nurse Practitioner) Danie Binder, MD (Inactive) as Consulting Physician (Gastroenterology) Dentist: Dr. Truman Hayward Ophtho: Dr. Jorja Loa (at [facility]) Allergist:  Dr. Ernst Bowler Dermatologist: Dr. Nevada Crane   Depression Screening: Flowsheet Row Office Visit from 06/08/2022 in [facility]  PHQ-2 Total Score 0        Falls screen:     06/08/2022    9:44 AM 05/29/2021    1:18 PM 05/22/2020    1:47 PM 05/08/2019    8:51 AM 05/02/2018    1:53 PM  [place] in the past year? 0 0 0 0 No  Number falls in past yr: 0 0     Injury with Fall? 0 0     Risk for fall due to : No Fall Risks No Fall Risks     Follow up Falls evaluation completed Falls evaluation completed        Functional Status Survey: Is the patient deaf or have difficulty hearing?: No Does the patient have difficulty seeing, even when wearing glasses/contacts?: No Does the patient have difficulty concentrating, remembering, or making decisions?: No Does the patient have difficulty walking or climbing stairs?: No Does the patient have difficulty dressing or bathing?: No Does the patient have difficulty doing errands alone such as visiting a doctor's office or shopping?: No  Mini-Cog Scoring: 5     End of Life Discussion:  Patient has a living will and medical power of attorney (scanned in 11/2019).   PMH, PSH, SH and FH were reviewed and updated.  Outpatient Encounter Medications as of 06/08/2022  Medication Sig Note   apixaban (ELIQUIS) 5 MG TABS tablet Take 1 tablet by mouth twice daily    Calcium Citrate-Vitamin D (CALCIUM + D PO) Take 2 tablets by mouth daily. 06/08/2022: 2 tablets =  1 serving, per pt   Cholecalciferol (VITAMIN D3) 2000 units TABS Take 2,000 Units by mouth daily.     citalopram (CELEXA) 20 MG tablet Take 1 tablet (20 mg total) by mouth daily.    diltiazem (CARDIZEM CD) 180 MG 24 hr capsule TAKE 1 CAPSULE BY MOUTH ONCE DAILY - MEDICATION TO CONTROL HEART RATE    diphenhydrAMINE (BENADRYL) 25 MG tablet Take 25 mg by mouth every 6 (six) hours as needed for itching or allergies (ALLERGY VACCINE).    EPINEPHrine (AUVI-Q) 0.3 mg/0.3 mL IJ SOAJ injection Inject 0.3 mg into the muscle as needed for anaphylaxis.    fexofenadine (ALLEGRA) 180 MG tablet Take 180 mg by mouth daily as needed for allergies (ALLERGY VACINE).    losartan (COZAAR) 50 MG tablet Take 1 tablet (50 mg total) by mouth daily.    Multiple Vitamins-Minerals (MULTIVITAMIN WITH MINERALS) tablet Take 1 tablet by mouth daily.    No facility-administered encounter medications on file as  of 06/08/2022.   Allergies  Allergen Reactions   Bee Venom Anaphylaxis   Epinephrine Other (See Comments)    High pulse rate.   Sulfa Antibiotics Nausea And Vomiting   Tetanus Toxoids Swelling and Other (See Comments)    Entire arm swelled   Vancomycin Nausea And Vomiting   Adhesive [Tape] Rash and Other (See Comments)    Her skin peels off.   Influenza Vaccine Live Swelling and Rash   Ivp Dye [Iodinated Contrast Media] Rash   Zyloprim [Allopurinol] Rash    ROS: The patient denies anorexia, fever, headaches, vision changes, decreased hearing, ear pain, sore throat, breast concerns, chest pain, palpitations, dizziness, syncope, dyspnea on exertion, cough, swelling, nausea, vomiting, diarrhea,abdominal pain, melena, hematochezia, hematuria, incontinence, dysuria, vaginal bleeding, discharge, odor or itch, numbness, tingling, weakness, tremor, suspicious skin lesions, abnormal bleeding/bruising, or enlarged lymph nodes. +arthritis in hands, only occasionally painful (weather-dependent), R ring finger getting crooked,  and now the L is also. R knee, occasional discomfort with weather changes. Rare heartburn with fatty foods (rare, relieved by Tums).  Some mild constipation, controlled with high fiber diet and stool softeners prn. Anxiety is well controlled, denies depression. Sees dermatologist   PHYSICAL EXAM:  BP 130/80   Pulse 75   Ht '5\' 2"'$  (1.575 m)   Wt 195 lb 3.2 oz (88.5 kg)   SpO2 97%   BMI 35.70 kg/m   Wt Readings from Last 3 Encounters:  06/08/22 195 lb 3.2 oz (88.5 kg)  08/14/21 203 lb (92.1 kg)  05/29/21 204 lb 3.2 oz (92.6 kg)    General Appearance   Alert, cooperative, no distress, appears stated age.   Head:    Normocephalic, without obvious abnormality, atraumatic     Eyes:    PERRL, conjunctiva and sclera are clear. EOM's intact, fundi benign   Ears:    Normal TM's and external ear canals     Nose:    Normal, no drainage or sinus tenderness   Throat:    Normal, no lesions  Neck:    Supple, no lymphadenopathy; thyroid: no enlargement/ tenderness/nodules; no carotid bruit or JVD     Back:    Spine nontender, no curvature, ROM normal, no CVA tenderness     Lungs:    Clear to auscultation bilaterally without wheezes, rales or ronchi; respirations unlabored     Chest Wall:    No tenderness or deformity     Heart:    Regular rate and rhythm, S1 and S2 normal, no murmur, rub or gallop. Occasional ectopy noted.  Breast Exam:    No tenderness, masses, or nipple discharge or inversion. No axillary lymphadenopathy     Abdomen:    Soft, non-tender, obese, nondistended, normoactive bowel sounds, no masses, no hepatosplenomegaly     Genitalia:    Normal external genitalia without lesions, mild atrophic changes. BUS and vagina normal; No abnormal vaginal discharge. Bimanual exam was normal, without any masses appreciable, nontender     Rectal:    Normal sphincter tone, no masses. Heme negative stool  Extremities:    No clubbing, cyanosis or edema. Some bony abnormalities c/w arthritis  (especially R ring finger, with ulnar deviation of the middle phalanx). L great toenail is thickened.  They are polished today.  Pulses:    2+ and symmetric all extremities     Skin:    Skin color, texture, turgor normal. a few small purpura on the LLE.  Lymph nodes:    Cervical, supraclavicular, inguinal and  axillary nodes normal     Neurologic:    Normal strength, sensation and gait; reflexes 2+ and symmetric throughout              Psych:   Normal mood, affect, hygiene and grooming       06/08/2022    9:44 AM  GAD 7 : Generalized Anxiety Score  Nervous, Anxious, on Edge 0  Control/stop worrying 0  Worry too much - different things 0  Trouble relaxing 0  Restless 0  Easily annoyed or irritable 0  Afraid - awful might happen 0  Total GAD 7 Score 0  Anxiety Difficulty Not difficult at all    ASSESSMENT/PLAN:  Annual physical exam  Medicare annual wellness visit, subsequent  Atrial fibrillation with rapid ventricular response (HCC) - paroxysmal. In NSR now. Cont Eliquis and CCB  Anticoagulant long-term use - no e/o bleeding or complications - Plan: CBC with Differential/Platelet  Vitamin D deficiency - cont supplements. If borderline low, to increase the dose in the winter months - Plan: VITAMIN D 25 Hydroxy (Vit-D Deficiency, Fractures)  Pure hypercholesterolemia - Plan: Lipid panel  Osteopenia, unspecified location - Plan: VITAMIN D 25 Hydroxy (Vit-D Deficiency, Fractures)  Essential hypertension - well controlled - Plan: Comprehensive metabolic panel, losartan (COZAAR) 50 MG tablet  Generalized anxiety disorder - well controlled. Will try cutting dose back to '10mg'$  daily. If recurrent anxiety, to go back to '20mg'$  dose of citalopram, long-term - Plan: citalopram (CELEXA) 10 MG tablet  Medication monitoring encounter - Plan: Lipid panel, Comprehensive metabolic panel, VITAMIN D 25 Hydroxy (Vit-D Deficiency, Fractures)  Senile purpura (HCC) - with minor trauma from her dog;  on blood thinners  Class 2 severe obesity due to excess calories with serious comorbidity and body mass index (BMI) of 35.0 to 35.9 in adult [facility]) - counseled re: exercise, diet; further wt loss encouraged  Vaccine counseling - strongly encouraged pneumovax (will consider getting from pharm); Rec shingrix, COVID booster, RSV vaccine when available  Forward labs to Dr. Harl Bowie, has appt tomorrow.  RF celexa and losartan Change celexa to '10mg'$  #90 (no refills--will contact us to let us know if needs to go back to '20mg'$ , vs if staying on '10mg'$  or trying further taper down/off).  Discussed monthly self breast exams and yearly mammograms; at least 30 minutes of aerobic activity at least 5 days/week, weight-bearing exercise 2-3x/wk; proper sunscreen use reviewed; healthy diet, including goals of calcium and vitamin D intake and alcohol recommendations (less than or equal to 1 drink/day--encouraged again to cut back) reviewed; regular seatbelt use; changing batteries in smoke detectors. Immunization recommendations discussed--allergic to flu shots. Shingrix and pneumovax recommended. Updated COVID booster and RSV vaccines recommended when available.   MOST form reviewed, unchanged. Full Code, Full Care  F/u 1 year, sooner prn if elevated BP's or other concerns    Medicare Attestation I have personally reviewed: The patient's medical and social history Their use of alcohol, tobacco or illicit drugs Their current medications and supplements The patient's functional ability including ADLs,fall risks, home safety risks, cognitive, and hearing and visual impairment Diet and physical activities Evidence for depression or mood disorders  The patient's weight, height, BMI have been recorded in the chart.  I have made referrals, counseling, and provided education to the patient based on review of the above and I have provided the patient with a written personalized care plan for preventive services.

## 2022-06-07 NOTE — Patient Instructions (Addendum)
HEALTH MAINTENANCE RECOMMENDATIONS:  It is recommended that you get at least 30 minutes of aerobic exercise at least 5 days/week (for weight loss, you may need as much as 60-90 minutes). This can be any activity that gets your heart rate up. This can be divided in 10-15 minute intervals if needed, but try and build up your endurance at least once a week.  Weight bearing exercise is also recommended twice weekly.  Eat a healthy diet with lots of vegetables, fruits and fiber.  "Colorful" foods have a lot of vitamins (ie green vegetables, tomatoes, red peppers, etc).  Limit sweet tea, regular sodas and alcoholic beverages, all of which has a lot of calories and sugar.  Up to 1 alcoholic drink daily may be beneficial for women (unless trying to lose weight, watch sugars).  Drink a lot of water.  Calcium recommendations are 1200-1500 mg daily (1500 mg for postmenopausal women or women without ovaries), and vitamin D 1000 IU daily.  This should be obtained from diet and/or supplements (vitamins), and calcium should not be taken all at once, but in divided doses.  Monthly self breast exams and yearly mammograms for women over the age of 26 is recommended.  Sunscreen of at least SPF 30 should be used on all sun-exposed parts of the skin when outside between the hours of 10 am and 4 pm (not just when at beach or pool, but even with exercise, golf, tennis, and yard work!)  Use a sunscreen that says "broad spectrum" so it covers both UVA and UVB rays, and make sure to reapply every 1-2 hours.  Remember to change the batteries in your smoke detectors when changing your clock times in the spring and fall. Carbon monoxide detectors are recommended for your home.  Use your seat belt every time you are in a car, and please drive safely and not be distracted with cell phones and texting while driving.   Monique James , Thank you for taking time to come for your Medicare Wellness Visit. I appreciate your ongoing  commitment to your health goals. Please review the following plan we discussed and let me know if I can assist you in the future.   This is a list of the screening recommended for you and due dates:  Health Maintenance  Topic Date Due   Zoster (Shingles) Vaccine (1 of 2) Never done   Pneumonia Vaccine (2 - PPSV23 or PCV20) 10/06/2016   COVID-19 Vaccine (4 - Moderna series) 03/14/2021   DEXA scan (bone density measurement)  Completed   HPV Vaccine  Aged Out   Pneumovax is recommended.  I encourage you to get the updated bivalent COVID booster when it is available in the Fall, as well as the new RSV vaccine.  I recommend getting the new shingles vaccine (Shingrix). Since you have Medicare, you will need to get this from the pharmacy, as it is covered by Part D. This is a series of 2 injections, spaced 2 months apart.   This should be separated from other vaccines by at least 2 weeks.  We are lowering the citalopram to '10mg'$  to see how you do on the lower dose. Stay at this dose for at least 1-2 months (unless your anxiety gets worse, then go back to the '20mg'$  dose. Take 2 tablets of the '10mg'$ , and then let us know so that we can send in the '20mg'$  tablets again). If you're doing great on the '10mg'$  in a couple of months, you can  try cutting back further (1/2 tablet daily for 1-2 months, and if doing great, can further taper off--1/2 tablet every other day for a week then stop).  I'm sending in #90 of the '10mg'$  dose.  Please contact us when you need more, let us know which dose, and how you are doing.  Please try and get more aerobic activity (150 minutes/week), and weight-bearing exercise at least 2x/week. Consider using the recumbant bike and/or silver sneaker classes at the [facility].  I encourage you to cut back on wine intake (1/day or less).

## 2022-06-08 ENCOUNTER — Encounter: Payer: Self-pay | Admitting: Family Medicine

## 2022-06-08 ENCOUNTER — Ambulatory Visit (INDEPENDENT_AMBULATORY_CARE_PROVIDER_SITE_OTHER): Payer: Medicare Other | Admitting: Family Medicine

## 2022-06-08 ENCOUNTER — Telehealth: Payer: Self-pay

## 2022-06-08 VITALS — BP 130/80 | HR 75 | Ht 62.0 in | Wt 195.2 lb

## 2022-06-08 DIAGNOSIS — I4891 Unspecified atrial fibrillation: Secondary | ICD-10-CM | POA: Diagnosis not present

## 2022-06-08 DIAGNOSIS — I10 Essential (primary) hypertension: Secondary | ICD-10-CM | POA: Diagnosis not present

## 2022-06-08 DIAGNOSIS — E559 Vitamin D deficiency, unspecified: Secondary | ICD-10-CM

## 2022-06-08 DIAGNOSIS — E7800 Pure hypercholesterolemia, unspecified: Secondary | ICD-10-CM | POA: Diagnosis not present

## 2022-06-08 DIAGNOSIS — F411 Generalized anxiety disorder: Secondary | ICD-10-CM

## 2022-06-08 DIAGNOSIS — E66812 Obesity, class 2: Secondary | ICD-10-CM

## 2022-06-08 DIAGNOSIS — Z0000 Encounter for general adult medical examination without abnormal findings: Secondary | ICD-10-CM

## 2022-06-08 DIAGNOSIS — M8580 Other specified disorders of bone density and structure, unspecified site: Secondary | ICD-10-CM

## 2022-06-08 DIAGNOSIS — Z5181 Encounter for therapeutic drug level monitoring: Secondary | ICD-10-CM

## 2022-06-08 DIAGNOSIS — D692 Other nonthrombocytopenic purpura: Secondary | ICD-10-CM | POA: Diagnosis not present

## 2022-06-08 DIAGNOSIS — Z7185 Encounter for immunization safety counseling: Secondary | ICD-10-CM | POA: Diagnosis not present

## 2022-06-08 DIAGNOSIS — Z7901 Long term (current) use of anticoagulants: Secondary | ICD-10-CM | POA: Diagnosis not present

## 2022-06-08 DIAGNOSIS — Z6835 Body mass index (BMI) 35.0-35.9, adult: Secondary | ICD-10-CM

## 2022-06-08 MED ORDER — CITALOPRAM HYDROBROMIDE 10 MG PO TABS: 10.0000 mg | ORAL_TABLET | Freq: Every day | ORAL | 0 refills | Status: DC

## 2022-06-08 MED ORDER — LOSARTAN POTASSIUM 50 MG PO TABS
50.0000 mg | ORAL_TABLET | Freq: Every day | ORAL | 3 refills | Status: DC
Start: 2022-06-08 — End: 2023-06-01

## 2022-06-08 NOTE — Telephone Encounter (Signed)
We had an extensive visit today, with full review of systems done.  There was no mention of sciatica at her visit, so we did not discuss this at all.  She would need a visit for discussion of risks/side effects, and to determine that it was necessary (she didn't mention any sciatica at visit, or on her paperwork as a concern)

## 2022-06-08 NOTE — Telephone Encounter (Signed)
Pt called back and waned to see if she could get some prednisone called in for her sciatic pain. She said she has used that before and it helped

## 2022-06-09 ENCOUNTER — Encounter: Payer: Self-pay | Admitting: Cardiology

## 2022-06-09 ENCOUNTER — Ambulatory Visit: Payer: Medicare Other | Admitting: Cardiology

## 2022-06-09 VITALS — BP 125/65 | HR 79 | Ht 64.0 in | Wt 195.0 lb

## 2022-06-09 DIAGNOSIS — I10 Essential (primary) hypertension: Secondary | ICD-10-CM

## 2022-06-09 DIAGNOSIS — D6869 Other thrombophilia: Secondary | ICD-10-CM

## 2022-06-09 DIAGNOSIS — I480 Paroxysmal atrial fibrillation: Secondary | ICD-10-CM | POA: Diagnosis not present

## 2022-06-09 LAB — LIPID PANEL
Chol/HDL Ratio: 2.3 ratio (ref 0.0–4.4)
Cholesterol, Total: 228 mg/dL — ABNORMAL HIGH (ref 100–199)
HDL: 101 mg/dL (ref >39)
LDL Chol Calc (NIH): 116 mg/dL — ABNORMAL HIGH (ref 0–99)
Triglycerides: 62 mg/dL (ref 0–149)
VLDL Cholesterol Cal: 11 mg/dL (ref 5–40)

## 2022-06-09 LAB — COMPREHENSIVE METABOLIC PANEL
ALT: 8 IU/L (ref 0–32)
AST: 10 IU/L (ref 0–40)
Albumin/Globulin Ratio: 1.4 (ref 1.2–2.2)
Albumin: 4.3 g/dL (ref 3.7–4.7)
Alkaline Phosphatase: 82 IU/L (ref 44–121)
BUN/Creatinine Ratio: 13 (ref 12–28)
BUN: 10 mg/dL (ref 8–27)
Bilirubin Total: 0.6 mg/dL (ref 0.0–1.2)
CO2: 21 mmol/L (ref 20–29)
Calcium: 9.8 mg/dL (ref 8.7–10.3)
Chloride: 94 mmol/L — ABNORMAL LOW (ref 96–106)
Creatinine, Ser: 0.76 mg/dL (ref 0.57–1.00)
Globulin, Total: 3 g/dL (ref 1.5–4.5)
Glucose: 93 mg/dL (ref 70–99)
Potassium: 4.5 mmol/L (ref 3.5–5.2)
Sodium: 132 mmol/L — ABNORMAL LOW (ref 134–144)
Total Protein: 7.3 g/dL (ref 6.0–8.5)
eGFR: 78 mL/min/{1.73_m2} (ref >59)

## 2022-06-09 LAB — CBC WITH DIFFERENTIAL/PLATELET
Basophils Absolute: 0.1 10*3/uL (ref 0.0–0.2)
Basos: 1 %
EOS (ABSOLUTE): 0 10*3/uL (ref 0.0–0.4)
Eos: 0 %
Hematocrit: 44 % (ref 34.0–46.6)
Hemoglobin: 15.1 g/dL (ref 11.1–15.9)
Immature Grans (Abs): 0 10*3/uL (ref 0.0–0.1)
Immature Granulocytes: 0 %
Lymphocytes Absolute: 2.3 10*3/uL (ref 0.7–3.1)
Lymphs: 31 %
MCH: 33.9 pg — ABNORMAL HIGH (ref 26.6–33.0)
MCHC: 34.3 g/dL (ref 31.5–35.7)
MCV: 99 fL — ABNORMAL HIGH (ref 79–97)
Monocytes Absolute: 0.7 10*3/uL (ref 0.1–0.9)
Monocytes: 9 %
Neutrophils Absolute: 4.5 10*3/uL (ref 1.4–7.0)
Neutrophils: 59 %
Platelets: 336 10*3/uL (ref 150–450)
RBC: 4.46 x10E6/uL (ref 3.77–5.28)
RDW: 11.8 % (ref 11.7–15.4)
WBC: 7.6 10*3/uL (ref 3.4–10.8)

## 2022-06-09 LAB — VITAMIN D 25 HYDROXY (VIT D DEFICIENCY, FRACTURES): Vit D, 25-Hydroxy: 28.2 ng/mL — ABNORMAL LOW (ref 30.0–100.0)

## 2022-06-09 NOTE — Patient Instructions (Signed)
Medication Instructions:  Your physician recommends that you continue on your current medications as directed. Please refer to the Current Medication list given to you today.   Labwork: None  Testing/Procedures: None  Follow-Up: Follow up with Dr. Branch in 1 year.   Any Other Special Instructions Will Be Listed Below (If Applicable).     If you need a refill on your cardiac medications before your next appointment, please call your pharmacy.  

## 2022-06-09 NOTE — Progress Notes (Signed)
Clinical Summary Monique James is a 81 y.o.female seen today for follow up of the following medical problems.   1. Afib - No recent palpitations - compliant with meds. No bleeding on eliquis     - no palpitations - compliant with meds - no bleeding on eliquis.      2. HTN - home bp's 130s/70s, she is compliant with meds    - home bp's 120s/60-70s   SH: loves dogs, has lost 2 dogs within the last few years but got a new dog about 2 years ago. 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 Shaune Leeks)   Colon polyps    Dr. Oletta Lamas   Colon polyps    path was small leiomyoma; tubular adenoma 12/2017   Constipation    Gout    history of ([id number])   Hemorrhoids    History of IBS    Hx of pelvic mass    complex, benign pathology   Hyperlipidemia    elevated trigs and LDL   Hypertension    Hyponatremia    mild   Osteopenia    mild (T-1.3 in 01/2014)   Shingles 02/2015     Allergies  Allergen Reactions   Bee Venom Anaphylaxis   Epinephrine Other (See Comments)    High pulse rate.   Sulfa Antibiotics Nausea And Vomiting   Tetanus Toxoids Swelling and Other (See Comments)    Entire arm swelled   Vancomycin Nausea And Vomiting   Adhesive [Tape] Rash and Other (See Comments)    Her skin peels off.   Influenza Vaccine Live Swelling and Rash   Ivp Dye [Iodinated Contrast Media] Rash   Zyloprim [Allopurinol] Rash     Current Outpatient Medications  Medication Sig Dispense Refill   apixaban (ELIQUIS) 5 MG TABS tablet Take 1 tablet by mouth twice daily 180 tablet 1   Calcium Citrate-Vitamin D (CALCIUM + D PO) Take 2 tablets by mouth daily.     Cholecalciferol (VITAMIN D3) 2000 units TABS Take 2,000 Units by mouth daily.      citalopram (CELEXA) 10 MG tablet Take 1 tablet (10 mg total) by mouth daily. 90 tablet  0   diltiazem (CARDIZEM CD) 180 MG 24 hr capsule TAKE 1 CAPSULE BY MOUTH ONCE DAILY - MEDICATION TO CONTROL HEART RATE 90 capsule 0   diphenhydrAMINE (BENADRYL) 25 MG tablet Take 25 mg by mouth every 6 (six) hours as needed for itching or allergies (ALLERGY VACCINE).     EPINEPHrine (AUVI-Q) 0.3 mg/0.3 mL IJ SOAJ injection Inject 0.3 mg into the muscle as needed for anaphylaxis.     fexofenadine (ALLEGRA) 180 MG tablet Take 180 mg by mouth daily as needed for allergies (ALLERGY VACINE).     losartan (COZAAR) 50 MG tablet Take 1 tablet (50 mg total) by mouth daily. 90 tablet 3   Multiple Vitamins-Minerals (MULTIVITAMIN WITH MINERALS) tablet Take 1 tablet by mouth daily.     No current facility-administered medications for this visit.     Past Surgical History:  Procedure Laterality Date   ABDOMINAL HYSTERECTOMY  1970   for perforated uterus from IUD   BILATERAL SALPINGOOPHORECTOMY  2005   benign tumor/cyst (Dr. Aldean Ast)   COLONOSCOPY N/A 12/31/2017   Procedure: COLONOSCOPY;  Surgeon: Barney Drain  L, MD;  [facility];  Service: Endoscopy;  Laterality: N/A;  10:30am   COLONOSCOPY N/A 05/23/2018   Procedure: COLONOSCOPY;  Surgeon: Danie Binder, MD;  [facility];  Service: Endoscopy;  Laterality: N/A;  8:30am   POLYPECTOMY  05/23/2018   Procedure: POLYPECTOMY;  Surgeon: Danie Binder, MD;  [facility];  Service: Endoscopy;;  ascending,hepatic, sigmoid,transverse     Allergies  Allergen Reactions   Bee Venom Anaphylaxis   Epinephrine Other (See Comments)    High pulse rate.   Sulfa Antibiotics Nausea And Vomiting   Tetanus Toxoids Swelling and Other (See Comments)    Entire arm swelled   Vancomycin Nausea And Vomiting   Adhesive [Tape] Rash and Other (See Comments)    Her skin peels off.   Influenza Vaccine Live Swelling and Rash   Ivp Dye [Iodinated Contrast Media] Rash   Zyloprim [Allopurinol] Rash      Family History  Problem  Relation Age of Onset   Hypertension Mother    Heart disease Mother        MI   Dementia Mother    Cancer Father        stomach   Cancer Brother        multiple myeloma   Healthy Daughter    Healthy Daughter    Healthy Daughter    Diabetes Neg Hx    Breast cancer Neg Hx    Colon cancer Neg Hx      Social History Monique James reports that she has never smoked. She has never used smokeless tobacco. Monique James reports that she does not currently use alcohol.   Review of Systems CONSTITUTIONAL: No weight loss, fever, chills, weakness or fatigue.  HEENT: Eyes: No visual loss, blurred vision, double vision or yellow sclerae.No hearing loss, sneezing, congestion, runny nose or sore throat.  SKIN: No rash or itching.  CARDIOVASCULAR: per hpi RESPIRATORY: No shortness of breath, cough or sputum.  GASTROINTESTINAL: No anorexia, nausea, vomiting or diarrhea. No abdominal pain or blood.  GENITOURINARY: No burning on urination, no polyuria NEUROLOGICAL: No headache, dizziness, syncope, paralysis, ataxia, numbness or tingling in the extremities. No change in bowel or bladder control.  MUSCULOSKELETAL: No muscle, back pain, joint pain or stiffness.  LYMPHATICS: No enlarged nodes. No history of splenectomy.  PSYCHIATRIC: No history of depression or anxiety.  ENDOCRINOLOGIC: No reports of sweating, cold or heat intolerance. No polyuria or polydipsia.  [name] Kitchen   Physical Examination Today's Vitals   06/09/22 0832 06/09/22 0848  BP: (!) 142/70 125/65  Pulse: 79   SpO2: 96%   Weight: 195 lb (88.5 kg)   Height: '5\' 4"'  (1.626 m)    Body mass index is 33.47 kg/m.  Gen: resting comfortably, no acute distress HEENT: no scleral icterus, pupils equal round and reactive, no palptable cervical adenopathy,  CV: RRR, no m/r/g, no jvd Resp: Clear to auscultation bilaterally GI: abdomen is soft, non-tender, non-distended, normal bowel sounds, no hepatosplenomegaly MSK: extremities are warm, no  edema.  Skin: warm, no rash Neuro:  no focal deficits Psych: appropriate affect   Diagnostic Studies Jan 2016 echo Study Conclusions  - Left ventricle: The cavity size was normal. Wall thickness was   increased in a pattern of moderate LVH. Systolic function was   vigorous. The estimated ejection fraction was in the range of 65%   to 70%. Diastolic function is abnormal, indeterminant grade. Wall   motion was normal; there were no regional  wall motion   abnormalities. - Aortic valve: Mildly calcified annulus. Trileaflet; mildly   thickened leaflets. - Mitral valve: Mildly calcified annulus. Mildly thickened leaflets    Assessment and Plan   1. Afib/acquried thrombophilia - no recent symptoms, doing well on diltiazem, will continue - continue eliquis for stroke prevention EKG today shows NSR     2. HTN - at goal, continue current meds  F/u 1 year     Arnoldo Lenis, M.D.

## 2022-07-13 ENCOUNTER — Ambulatory Visit: Payer: Medicare Other

## 2022-07-16 ENCOUNTER — Ambulatory Visit (INDEPENDENT_AMBULATORY_CARE_PROVIDER_SITE_OTHER): Payer: Medicare Other

## 2022-07-16 DIAGNOSIS — T63441D Toxic effect of venom of bees, accidental (unintentional), subsequent encounter: Secondary | ICD-10-CM | POA: Diagnosis not present

## 2022-07-27 ENCOUNTER — Telehealth: Payer: Self-pay | Admitting: Cardiology

## 2022-07-27 MED ORDER — APIXABAN 5 MG PO TABS: 5.0000 mg | ORAL_TABLET | Freq: Two times a day (BID) | ORAL | 1 refills | Status: DC

## 2022-07-27 MED ORDER — DILTIAZEM HCL ER COATED BEADS 180 MG PO CP24: ORAL_CAPSULE | ORAL | 3 refills | Status: DC

## 2022-07-27 NOTE — Telephone Encounter (Signed)
*  STAT* If patient is at the pharmacy, call can be transferred to refill team.   1. Which medications need to be refilled? (please list name of each medication and dose if known)   apixaban (ELIQUIS) 5 MG TABS tablet    diltiazem (CARDIZEM CD) 180 MG 24 hr capsule   2. Which pharmacy/location (including street and city if local pharmacy) is medication to be sent to?  [place], [place] - [id number] [place] [address] Phone:  [phone]        3. Do they need a 30 day or 90 day supply?  90 day

## 2022-07-27 NOTE — Telephone Encounter (Signed)
Prescription refill request for Eliquis received. Indication: afib  Last office visit: 06/09/2022, [place] Scr: 0.76, 06/08/2022 Age: 80 yo  Weight: 88.5 kg   Pt is on the correct dose of Eliquis refill sent.

## 2022-07-27 NOTE — Telephone Encounter (Signed)
I refilled diltiazem. I will forward Eliquis refill to L.Joneen Caraway, RN

## 2022-08-24 ENCOUNTER — Telehealth: Payer: Self-pay | Admitting: *Deleted

## 2022-08-24 DIAGNOSIS — F411 Generalized anxiety disorder: Secondary | ICD-10-CM

## 2022-08-24 MED ORDER — CITALOPRAM HYDROBROMIDE 20 MG PO TABS
20.0000 mg | ORAL_TABLET | Freq: Every day | ORAL | 3 refills | Status: DC
Start: 2022-08-24 — End: 2023-08-17

## 2022-08-24 NOTE — Telephone Encounter (Signed)
Patient called to let you know after her Aug visit she took the '10mg'$  citalopram for about 30 days. Could not sleep and didn't feel right. She went back to the '20mg'$ . Needs refill now, but '20mg'$ -sent to [facility] (90 day). I can send if you like, just wanted you to know where she was at with this.

## 2022-09-10 ENCOUNTER — Ambulatory Visit (INDEPENDENT_AMBULATORY_CARE_PROVIDER_SITE_OTHER): Payer: Medicare Other

## 2022-09-10 DIAGNOSIS — T63441D Toxic effect of venom of bees, accidental (unintentional), subsequent encounter: Secondary | ICD-10-CM | POA: Diagnosis not present

## 2022-10-22 ENCOUNTER — Other Ambulatory Visit (HOSPITAL_COMMUNITY): Payer: Self-pay | Admitting: Family Medicine

## 2022-10-22 DIAGNOSIS — Z1231 Encounter for screening mammogram for malignant neoplasm of breast: Secondary | ICD-10-CM

## 2022-11-04 ENCOUNTER — Ambulatory Visit (INDEPENDENT_AMBULATORY_CARE_PROVIDER_SITE_OTHER): Payer: Medicare Other

## 2022-11-04 DIAGNOSIS — T63441D Toxic effect of venom of bees, accidental (unintentional), subsequent encounter: Secondary | ICD-10-CM

## 2022-11-18 ENCOUNTER — Ambulatory Visit (HOSPITAL_COMMUNITY): Payer: Medicare Other

## 2022-11-23 ENCOUNTER — Ambulatory Visit (HOSPITAL_COMMUNITY)
Admission: RE | Admit: 2022-11-23 | Discharge: 2022-11-23 | Disposition: A | Discharge status: Home / Self Care | Payer: Medicare Other | Source: Ambulatory Visit | Attending: Family Medicine | Admitting: Family Medicine

## 2022-11-23 DIAGNOSIS — Z1231 Encounter for screening mammogram for malignant neoplasm of breast: Secondary | ICD-10-CM | POA: Diagnosis not present

## 2022-12-17 ENCOUNTER — Encounter: Payer: Self-pay | Admitting: *Deleted

## 2022-12-23 ENCOUNTER — Other Ambulatory Visit: Payer: Self-pay

## 2022-12-23 ENCOUNTER — Encounter: Payer: Self-pay | Admitting: Allergy & Immunology

## 2022-12-23 ENCOUNTER — Ambulatory Visit (INDEPENDENT_AMBULATORY_CARE_PROVIDER_SITE_OTHER): Payer: Medicare Other | Admitting: Allergy & Immunology

## 2022-12-23 ENCOUNTER — Ambulatory Visit: Payer: Medicare Other

## 2022-12-23 VITALS — BP 112/76 | HR 103 | Temp 97.6°F | Resp 16 | Ht 63.0 in | Wt 193.0 lb

## 2022-12-23 DIAGNOSIS — T63441D Toxic effect of venom of bees, accidental (unintentional), subsequent encounter: Secondary | ICD-10-CM

## 2022-12-23 MED ORDER — EPINEPHRINE 0.3 MG/0.3ML IJ SOAJ: 0.3000 mg | Freq: Once | INTRAMUSCULAR | 2 refills | Status: AC

## 2022-12-23 NOTE — Progress Notes (Signed)
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 Venom hypersensitivity  - Continue with allergy shots at the same schedule. - EpiPen is up to date. - It seems that you are doing very well with the current schedule.   2. Return in about 1 year (around 12/24/2023).   Subjective:   Monique James is a 82 y.o. 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  Monique James is a 82 y.o. female presenting for a follow up visit.  She was last seen in October 2022.  At that time, she was doing very well.  We talked about spacing out her venom shots to every 12 weeks, but she preferred to just stick with every 8 weeks.  We made sure her epinephrine autoinjector was up-to-date.  She has plans to just relax for her birthday. Her daughter is going to visit from [facility]. She comes around once per month anyway and she brings up a cake on her birthday.   Venom immunotherapy is going well.  She has not had a sting in 3 years.  At that time, she had a stinging insect on her left arm.  She had a large local reaction, but no anaphylaxis which is  what she originally started her venom immunotherapy.  She remembers not being able to breathe when she was stung and she does not want to experience that again.  She does need an epinephrine refill.  Auvi-Q was no longer covered.  Her 14yo grand daughter has a rare blood disorder that causes hemolytic anemia when she has any fevers.   She is on Eliquis and this is rather expensive. She overall does not have problems affording her medications.    Otherwise, there have been no changes to her past medical history, surgical history, family history, or social history.    Review of Systems  Constitutional: Negative.  Negative for fever, malaise/fatigue and weight loss.  HENT: Negative.  Negative for congestion, ear discharge and ear pain.   Eyes:  Negative for pain, discharge and redness.  Respiratory:  Negative for cough, sputum production, shortness of breath and wheezing.   Cardiovascular: Negative.  Negative for chest pain and palpitations.  Gastrointestinal:  Negative for abdominal pain, heartburn, nausea and vomiting.  Skin: Negative.  Negative for itching and rash.  Neurological:  Negative for dizziness and headaches.  Endo/Heme/Allergies:  Negative for environmental allergies. Does not bruise/bleed easily.  All other systems reviewed and are negative.      Objective:   Blood pressure 112/76, pulse (!) 103, temperature 97.6 F (36.4 C), resp. rate 16, height 5' 3"$  (1.6 m), weight 193 lb (87.5 kg),  SpO2 94 %. Body mass index is 34.19 kg/m.    Physical Exam Vitals reviewed.  Constitutional:      Appearance: She is well-developed.  HENT:     Head: Normocephalic and atraumatic.     Right Ear: Tympanic membrane, ear canal and external ear normal.     Left Ear: Tympanic membrane, ear canal and external ear normal.     Nose: No nasal deformity, septal deviation, mucosal edema or rhinorrhea.     Right Turbinates: Not enlarged or swollen.     Left Turbinates: Not enlarged or  swollen.     Right Sinus: No maxillary sinus tenderness or frontal sinus tenderness.     Left Sinus: No maxillary sinus tenderness or frontal sinus tenderness.     Mouth/Throat:     Mouth: Mucous membranes are not pale and not dry.     Pharynx: Uvula midline.  Eyes:     General: Lids are normal. No allergic shiner.       Right eye: No discharge.        Left eye: No discharge.     Conjunctiva/sclera: Conjunctivae normal.     Right eye: Right conjunctiva is not injected. No chemosis.    Left eye: Left conjunctiva is not injected. No chemosis.    Pupils: Pupils are equal, round, and reactive to light.  Cardiovascular:     Rate and Rhythm: Normal rate and regular rhythm.     Heart sounds: Normal heart sounds.  Pulmonary:     Effort: Pulmonary effort is normal. No tachypnea, accessory muscle usage or respiratory distress.     Breath sounds: Normal breath sounds. No wheezing, rhonchi or rales.  Chest:     Chest wall: No tenderness.  Lymphadenopathy:     Cervical: No cervical adenopathy.  Skin:    Coloration: Skin is not pale.     Findings: No abrasion, erythema, petechiae or rash. Rash is not papular, urticarial or vesicular.  Neurological:     Mental Status: She is alert.  Psychiatric:        Behavior: Behavior is cooperative.      Diagnostic studies: none      Salvatore Marvel, MD  [facility]

## 2022-12-23 NOTE — Patient Instructions (Addendum)
1. Venom hypersensitivity  - Continue with allergy shots at the same schedule. - EpiPen is up to date. - It seems that you are doing very well with the current schedule.   2. Return in about 1 year (around 12/24/2023).    Please inform us of any Emergency Department visits, hospitalizations, or changes in symptoms. Call us before going to the ED for breathing or allergy symptoms since we might be able to fit you in for a sick visit. Feel free to contact us anytime with any questions, problems, or concerns.  It was a pleasure to see you again today!  Websites that have reliable patient information: 1. American Academy of Asthma, Allergy, and Immunology: www.aaaai.org 2. Food Allergy Research and Education (FARE): foodallergy.org 3. Mothers of Asthmatics: http://www.asthmacommunitynetwork.org 4. American College of Allergy, Asthma, and Immunology: www.acaai.org   COVID-19 Vaccine Information can be found at: ShippingScam.co.uk For questions related to vaccine distribution or appointments, please email vaccine@[email or web address] .com or call [phone].     "Like" [place] on Facebook and Instagram for our latest updates!       Make sure you are registered to vote! If you have moved or changed any of your contact information, you will need to get this updated before voting!  In some cases, you MAY be able to register to vote online: [email or web address]

## 2023-02-08 ENCOUNTER — Other Ambulatory Visit: Payer: Self-pay | Admitting: Cardiology

## 2023-02-09 NOTE — Telephone Encounter (Signed)
Prescription refill request for Eliquis received. Indication: PAF Last office visit: 06/09/22  Dominga Ferry MD Scr: 0.76 on 06/08/22  Epic Age: 82 Weight: 88.5kg  Based on above findings Eliquis 5mg  twice daily is the appropriate dose.  Refill approved.

## 2023-02-17 ENCOUNTER — Ambulatory Visit (INDEPENDENT_AMBULATORY_CARE_PROVIDER_SITE_OTHER): Payer: Medicare Other

## 2023-02-17 DIAGNOSIS — T63441D Toxic effect of venom of bees, accidental (unintentional), subsequent encounter: Secondary | ICD-10-CM

## 2023-02-22 DIAGNOSIS — L570 Actinic keratosis: Secondary | ICD-10-CM | POA: Diagnosis not present

## 2023-02-22 DIAGNOSIS — L820 Inflamed seborrheic keratosis: Secondary | ICD-10-CM | POA: Diagnosis not present

## 2023-02-22 DIAGNOSIS — Z1283 Encounter for screening for malignant neoplasm of skin: Secondary | ICD-10-CM | POA: Diagnosis not present

## 2023-02-22 DIAGNOSIS — D225 Melanocytic nevi of trunk: Secondary | ICD-10-CM | POA: Diagnosis not present

## 2023-02-22 DIAGNOSIS — X32XXXD Exposure to sunlight, subsequent encounter: Secondary | ICD-10-CM | POA: Diagnosis not present

## 2023-03-03 ENCOUNTER — Ambulatory Visit: Payer: Self-pay

## 2023-03-03 NOTE — Patient Outreach (Signed)
  Care Coordination   Initial Visit Note   03/03/2023 Name: Monique James MRN: [id number] DOB: August 19, 1941  Monique James is a 82 y.o. year old female who sees Joselyn Arrow, MD for primary care. I spoke with  Darron Doom by phone today.  What matters to the patients health and wellness today?  No concerns, patient reports she is doing well at home    Goals Addressed             This Visit's Progress    COMPLETED: Care Coordination Activities       Care Coordination Interventions: SDoH screening performed - no acute resource challenges identified at this time Determined the patient does not have concerns with medication costs and/or adherence at this time Education provided on the role of the care coordination team - no follow up desired at this time Encouraged the patient to contact her primary care provider as needed         SDOH assessments and interventions completed:  Yes  SDOH Interventions Today    Flowsheet Row Most Recent Value  SDOH Interventions   Food Insecurity Interventions Intervention Not Indicated  Housing Interventions Intervention Not Indicated  Transportation Interventions Intervention Not Indicated  Utilities Interventions Intervention Not Indicated        Care Coordination Interventions:  Yes, provided   Interventions Today    Flowsheet Row Most Recent Value  Chronic Disease   Chronic disease during today's visit Hypertension (HTN), Atrial Fibrillation (AFib)  General Interventions   General Interventions Discussed/Reviewed General Interventions Discussed, Doctor Visits  Doctor Visits Discussed/Reviewed Doctor Visits Reviewed  Education Interventions   Education Provided Provided Education  Provided Verbal Education On Other  [role of the care coordination team]        Follow up plan: No further intervention required.   Encounter Outcome:  Pt. Visit Completed   Bevelyn Ngo, BSW, CDP Social Worker, Certified Dementia  Practitioner [facility] Care Management  Care Coordination [phone]

## 2023-03-03 NOTE — Patient Instructions (Signed)
Visit Information  Thank you for taking time to visit with me today. Please don't hesitate to contact me if I can be of assistance to you.   Following are the goals we discussed today:  - Contact your primary care provider as needed   If you are experiencing a Mental Health or Behavioral Health Crisis or need someone to talk to, please call 1-800-273-TALK (toll free, 24 hour hotline)  The patient verbalized understanding of instructions, educational materials, and care plan provided today and DECLINED offer to receive copy of patient instructions, educational materials, and care plan.   Bevelyn Ngo, BSW, CDP Social Worker, Certified Dementia Practitioner [facility] Care Management  Care Coordination [phone]

## 2023-04-14 ENCOUNTER — Ambulatory Visit (INDEPENDENT_AMBULATORY_CARE_PROVIDER_SITE_OTHER): Payer: Medicare Other

## 2023-04-14 DIAGNOSIS — T63441D Toxic effect of venom of bees, accidental (unintentional), subsequent encounter: Secondary | ICD-10-CM | POA: Diagnosis not present

## 2023-05-11 NOTE — Progress Notes (Signed)
  Cardiology Office Note:  .   Date:  05/25/2023  ID:  Monique James, DOB 06-11-1941, MRN [id number] PCP: Joselyn Arrow, MD  [facility] Providers Cardiologist:  Dina Rich, MD    History of Present Illness: [name] Kitchen   Monique James is a 81 y.o. female with history of PAF and HTN.  Patient comes in for yearly f/u. Denies chest pain, palpitations, dyspnea, edema, dizziness. DOE if she carries something heavy. Goes to [facility] 2-3 for water aerobics 35-40 min. No bleeding problems on eliquis.   ROS:    Studies Reviewed: [name] Kitchen    EKG Interpretation Date/Time:  Tuesday May 25 2023 10:57:13 EDT Ventricular Rate:  77 PR Interval:  104 QRS Duration:  130 QT Interval:  408 QTC Calculation: 461 R Axis:   89  Text Interpretation: Normal sinus rhythm Right bundle branch block When compared with ECG of 02-Aug-2016 19:42, PREVIOUS ECG IS PRESENT Confirmed by Jacolyn Reedy [phone]) on 05/25/2023 11:11:14 AM    Prior CV Studies:   Jan 2016 echo Study Conclusions  - Left ventricle: The cavity size was normal. Wall thickness was   increased in a pattern of moderate LVH. Systolic function was   vigorous. The estimated ejection fraction was in the range of 65%   to 70%. Diastolic function is abnormal, indeterminant grade. Wall   motion was normal; there were no regional wall motion   abnormalities. - Aortic valve: Mildly calcified annulus. Trileaflet; mildly   thickened leaflets. - Mitral valve: Mildly calcified annulus. Mildly thickened leaflets      Risk Assessment/Calculations:             Physical Exam:   VS:  BP 130/76   Pulse 78   Ht 5\' 4"  (1.626 m)   Wt 193 lb (87.5 kg)   SpO2 97%   BMI 33.13 kg/m    Wt Readings from Last 3 Encounters:  05/25/23 193 lb (87.5 kg)  05/17/23 193 lb 9.6 oz (87.8 kg)  12/23/22 193 lb (87.5 kg)    GEN: Well nourished, well developed in no acute distress NECK: No JVD; No carotid bruits CARDIAC:  RRR, no murmurs, rubs, gallops RESPIRATORY:   Clear to auscultation without rales, wheezing or rhonchi  ABDOMEN: Soft, non-tender, non-distended EXTREMITIES:  No edema; No deformity   ASSESSMENT AND PLAN: .    PAF on diltiazem and eliquis-no bleeding problems or palpitations. To have blood work by PCP next month. In NSR today  HTN BP controlled  Obestiy-exercise and weight loss-has lost 18 lbs in the past year by not snacking.        Dispo: f/u in 1 yr Dr. Wyline Mood  Signed, Jacolyn Reedy, PA-C

## 2023-05-17 ENCOUNTER — Encounter: Payer: Self-pay | Admitting: Gastroenterology

## 2023-05-17 ENCOUNTER — Ambulatory Visit: Payer: Medicare Other | Admitting: Gastroenterology

## 2023-05-17 VITALS — BP 137/77 | HR 81 | Temp 98.3°F | Ht 64.0 in | Wt 193.6 lb

## 2023-05-17 DIAGNOSIS — Z86010 Personal history of colonic polyps: Secondary | ICD-10-CM

## 2023-05-17 NOTE — Progress Notes (Signed)
GI Office Note    Referring Provider: Joselyn Arrow, MD Primary Care Physician:  Joselyn Arrow, MD  Primary Gastroenterologist: Hennie Duos. Marletta Lor, DO   Chief Complaint   Chief Complaint  Patient presents with   Follow-up    Doing well, no issues     History of Present Illness   Monique James is a 82 y.o. female presenting today for consideration of surveillance colonoscopy. Offered ov to discuss due to age.   Last seen in 2019. Previously had failed colonoscopy in March of 2019, due to poor prep.  Several months later had repeat colonoscopy as outlined below. Advised at that time that she would not need future surveillance exams.   Patient states she is doing well. No bowel concerns. No melena, brbpr. No abdominal pain. No UGI symptoms. No unintentional weight loss. She is not really interested in a colonoscopy given her age and need for anticoagulation in the setting of A fib.  Colonoscopy 05/2018: -one 10mm polyp in prox trv colon removed -four 4-1mm polyp in the sigmoid colon, desc colon, prox trv colon removed -five 2-34mm polyps in the desc colon, trv colon and asc colon removed -diverticulosis -external hemorrhoids -7 simple adenomas, one serrated adenoma, two hyperplastic polyps -no repeat colonoscopy needed due to age  Medications   Current Outpatient Medications  Medication Sig Dispense Refill   apixaban (ELIQUIS) 5 MG TABS tablet Take 1 tablet by mouth twice daily 180 tablet 1   Calcium Citrate-Vitamin D (CALCIUM + D PO) Take 2 tablets by mouth daily.     Cholecalciferol (VITAMIN D3) 2000 units TABS Take 2,000 Units by mouth daily.      citalopram (CELEXA) 20 MG tablet Take 1 tablet (20 mg total) by mouth daily. 90 tablet 3   diltiazem (CARDIZEM CD) 180 MG 24 hr capsule TAKE 1 CAPSULE BY MOUTH ONCE DAILY - MEDICATION TO CONTROL HEART RATE 90 capsule 3   diphenhydrAMINE (BENADRYL) 25 MG tablet Take 25 mg by mouth every 6 (six) hours as needed for itching or  allergies (ALLERGY VACCINE).     fexofenadine (ALLEGRA) 180 MG tablet Take 180 mg by mouth daily as needed for allergies (ALLERGY VACINE).     losartan (COZAAR) 50 MG tablet Take 1 tablet (50 mg total) by mouth daily. 90 tablet 3   Multiple Vitamins-Minerals (MULTIVITAMIN WITH MINERALS) tablet Take 1 tablet by mouth daily.     No current facility-administered medications for this visit.    Allergies   Allergies as of 05/17/2023 - Review Complete 05/17/2023  Allergen Reaction Noted   Bee venom Anaphylaxis 07/30/2011   Epinephrine Other (See Comments) 07/28/2011   Sulfa antibiotics Nausea And Vomiting 07/28/2011   Tetanus toxoids Swelling and Other (See Comments) 08/04/2012   Vancomycin Nausea And Vomiting 07/28/2011   Adhesive [tape] Rash and Other (See Comments) 02/04/2012   Influenza vaccine live Swelling and Rash 07/30/2011   Ivp dye [iodinated contrast media] Rash 07/28/2011   Zyloprim [allopurinol] Rash 07/28/2011    Past Medical History   Past Medical History:  Diagnosis Date   Anxiety    Atrial fibrillation with rapid ventricular response (HCC) 11/05/2014   New dx   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 Beaulah Dinning)   Colon polyps    Dr. Randa Evens   Colon polyps    path was small leiomyoma; tubular adenoma 12/2017   Constipation    Gout    history of ([id number])   Hemorrhoids  History of IBS    Hx of pelvic mass    complex, benign pathology   Hyperlipidemia    elevated trigs and LDL   Hypertension    Hyponatremia    mild   Osteopenia    mild (T-1.3 in 01/2014)   Shingles 02/2015    Past Surgical History   Past Surgical History:  Procedure Laterality Date   ABDOMINAL HYSTERECTOMY  1970   for perforated uterus from IUD   BILATERAL SALPINGOOPHORECTOMY  2005   benign tumor/cyst (Dr. Loree Fee)   COLONOSCOPY N/A 12/31/2017   Procedure: COLONOSCOPY;  Surgeon: West Bali, MD;  Location: [facility];  Service: Endoscopy;  Laterality: N/A;  10:30am    COLONOSCOPY N/A 05/23/2018   Procedure: COLONOSCOPY;  Surgeon: West Bali, MD;  Location: [facility];  Service: Endoscopy;  Laterality: N/A;  8:30am   POLYPECTOMY  05/23/2018   Procedure: POLYPECTOMY;  Surgeon: West Bali, MD;  Location: [facility];  Service: Endoscopy;;  ascending,hepatic, sigmoid,transverse    Past Family History   Family History  Problem Relation Age of Onset   Hypertension Mother    Heart disease Mother        MI   Dementia Mother    Cancer Father        stomach, spread to colon   Cancer Brother        multiple myeloma   Healthy Daughter    Healthy Daughter    Healthy Daughter    Diabetes Neg Hx    Breast cancer Neg Hx    Colon cancer Neg Hx     Past Social History   Social History   Socioeconomic History   Marital status: Widowed    Spouse name: Not on file   Number of children: 3   Years of education: Not on file   Highest education level: Not on file  Occupational History   Occupation: retired [employer] fundraiser  Tobacco Use   Smoking status: Never   Smokeless tobacco: Never  Vaping Use   Vaping status: Never Used  Substance and Sexual Activity   Alcohol use: Not Currently    Comment: 1-2 glasses of wine daily   Drug use: No   Sexual activity: Not Currently  Other Topics Concern   Not on file  Social History Narrative   Lives alone, 1 dog Remigio Eisenmenger)   Daughter lives nearby.  3 grandchildren (2 here, 1 in [place]).  Other children in [address] and [place].   Widowed (age 21--husband had aortic aneurysm repair, and graft failed/ruptured)--death anniversary date 10-20-2023.  He had been a Holocaust survivor.      Updated 06/2022   Social Determinants of Health   Financial Resource Strain: Not on file  Food Insecurity: No Food Insecurity (03/03/2023)   Hunger Vital Sign    Worried About Running Out of Food in the Last Year: Never true    Ran Out of Food in the Last Year: Never true  Transportation Needs: No Transportation Needs (03/03/2023)    PRAPARE - Administrator, Civil Service (Medical): No    Lack of Transportation (Non-Medical): No  Physical Activity: Not on file  Stress: Not on file  Social Connections: Not on file  Intimate Partner Violence: Not on file    Review of Systems   General: Negative for anorexia, weight loss, fever, chills, fatigue, weakness. Eyes: Negative for vision changes.  ENT: Negative for hoarseness, difficulty swallowing , nasal congestion. CV: Negative for  chest pain, angina, palpitations, dyspnea on exertion, peripheral edema.  Respiratory: Negative for dyspnea at rest, dyspnea on exertion, cough, sputum, wheezing.  GI: See history of present illness. GU:  Negative for dysuria, hematuria, urinary incontinence, urinary frequency, nocturnal urination.  MS: Negative for joint pain, low back pain.  Derm: Negative for rash or itching.  Neuro: Negative for weakness, abnormal sensation, seizure, frequent headaches, memory loss,  confusion.  Psych: Negative for anxiety, depression, suicidal ideation, hallucinations.  Endo: Negative for unusual weight change.  Heme: Negative for bruising or bleeding. Allergy: Negative for rash or hives.  Physical Exam   BP 137/77 (BP Location: Right Arm, Patient Position: Sitting, Cuff Size: Large)   Pulse 81   Temp 98.3 F (36.8 C) (Oral)   Ht 5\' 4"  (1.626 m)   Wt 193 lb 9.6 oz (87.8 kg)   SpO2 95%   BMI 33.23 kg/m    General: Well-nourished, well-developed in no acute distress.  Head: Normocephalic, atraumatic.   Eyes: Conjunctiva pink, no icterus. Mouth: Oropharyngeal mucosa moist and pink  Neck: Supple without thyromegaly, masses, or lymphadenopathy.  Lungs: Clear to auscultation bilaterally.  Heart: Regular rate and rhythm, no murmurs rubs or gallops.  Abdomen: Bowel sounds are normal, nontender, nondistended, no hepatosplenomegaly or masses,  no abdominal bruits or hernia, no rebound or guarding.   Rectal: not performed Extremities: No  lower extremity edema. No clubbing or deformities.  Neuro: Alert and oriented x 4 , grossly normal neurologically.  Skin: Warm and dry, no rash or jaundice.   Psych: Alert and cooperative, normal mood and affect.  Labs   Lab Results  Component Value Date   NA 132 (L) 06/08/2022   CL 94 (L) 06/08/2022   K 4.5 06/08/2022   CO2 21 06/08/2022   BUN 10 06/08/2022   CREATININE 0.76 06/08/2022   EGFR 78 06/08/2022   CALCIUM 9.8 06/08/2022   ALBUMIN 4.3 06/08/2022   GLUCOSE 93 06/08/2022   Lab Results  Component Value Date   ALT 8 06/08/2022   AST 10 06/08/2022   ALKPHOS 82 06/08/2022   BILITOT 0.6 06/08/2022   Lab Results  Component Value Date   WBC 7.6 06/08/2022   HGB 15.1 06/08/2022   HCT 44.0 06/08/2022   MCV 99 (H) 06/08/2022   PLT 336 06/08/2022    Imaging Studies   No results found.  Assessment   *H/O adenomatous colon polyps  Discussed at length with patient today. Discussed with Dr. Marletta Lor. Her last colonoscopy was completed at age 38. She is now 82 years old. She is having no bowel concerns. Likelihood of colon polyps/colon cancer is relatively low and at this time risks of procedure likely outweigh benefits. She is completely agreeable and does not want to pursue a colonoscopy at this time.   PLAN   Monitor for change in bowels, blood in stool, anemia (labs with PCP). If any signs/symptoms develop she may need colonoscopy in the future.  Return office visit as needed.    Leanna Battles. Melvyn Neth, MHS, PA-C [facility]

## 2023-05-17 NOTE — Patient Instructions (Signed)
As discussed today, based on current guidelines, it is acceptable to NOT pursue any further "screening" colonoscopies due to your age. There is potential of growing polyps, but overall I believe the risks of screening exam outweigh any benefits of exam at this time based on your prior colonoscopy findings and your need for anticoagulation due to Afib.  I would recommend you monitor for any bowel changes, blood in the stool, have labs to periodically evaluate for any anemia by your PCP. If you develop any issues, then you may need colonoscopy at that time.  Please don't hesitate to reach out if any questions or concerns.   It was a pleasure to see you today. I want to create trusting relationships with patients and provide genuine, compassionate, and quality care. I truly value your feedback, so please be on the lookout for a survey regarding your visit with me today. I appreciate your time in completing this!

## 2023-05-25 ENCOUNTER — Encounter: Payer: Self-pay | Admitting: Physician Assistant

## 2023-05-25 ENCOUNTER — Other Ambulatory Visit: Payer: Self-pay | Admitting: *Deleted

## 2023-05-25 ENCOUNTER — Ambulatory Visit: Payer: Medicare Other | Attending: Physician Assistant | Admitting: Physician Assistant

## 2023-05-25 VITALS — BP 130/76 | HR 78 | Ht 64.0 in | Wt 193.0 lb

## 2023-05-25 DIAGNOSIS — I10 Essential (primary) hypertension: Secondary | ICD-10-CM

## 2023-05-25 DIAGNOSIS — E669 Obesity, unspecified: Secondary | ICD-10-CM | POA: Diagnosis not present

## 2023-05-25 DIAGNOSIS — I480 Paroxysmal atrial fibrillation: Secondary | ICD-10-CM | POA: Diagnosis not present

## 2023-05-25 MED ORDER — DILTIAZEM HCL ER COATED BEADS 180 MG PO CP24: ORAL_CAPSULE | ORAL | 3 refills | Status: DC

## 2023-05-25 MED ORDER — APIXABAN 5 MG PO TABS: 5.0000 mg | ORAL_TABLET | Freq: Two times a day (BID) | ORAL | 1 refills | Status: DC

## 2023-05-25 NOTE — Telephone Encounter (Signed)
Prescription refill request for Eliquis received. Indication: PAF Last office visit: 05/25/23  Leda Gauze PA-C Scr: 0.76 on 06/08/22  Epic  Blood work with PCP next month Age: 82 Weight: 87.5kg  Based on above findings Eliquis 5mg  twice daily is the appropriate dose.  Refill approved.

## 2023-05-25 NOTE — Patient Instructions (Signed)
Medication Instructions:  Your physician recommends that you continue on your current medications as directed. Please refer to the Current Medication list given to you today.   Labwork: None today  Testing/Procedures: None today  Follow-Up: 1 year Dr.Branch  Any Other Special Instructions Will Be Listed Below (If Applicable).  If you need a refill on your cardiac medications before your next appointment, please call your pharmacy.

## 2023-06-01 ENCOUNTER — Other Ambulatory Visit: Payer: Self-pay | Admitting: Family Medicine

## 2023-06-01 DIAGNOSIS — I10 Essential (primary) hypertension: Secondary | ICD-10-CM

## 2023-06-01 NOTE — Telephone Encounter (Signed)
Patient needed before next appt.

## 2023-06-09 ENCOUNTER — Ambulatory Visit (INDEPENDENT_AMBULATORY_CARE_PROVIDER_SITE_OTHER): Payer: Medicare Other

## 2023-06-09 DIAGNOSIS — T63441D Toxic effect of venom of bees, accidental (unintentional), subsequent encounter: Secondary | ICD-10-CM

## 2023-06-15 DIAGNOSIS — D6869 Other thrombophilia: Secondary | ICD-10-CM | POA: Insufficient documentation

## 2023-06-15 NOTE — Progress Notes (Signed)
Chief Complaint  Patient presents with   Annual Exam    MWV fasting labs, no other issues, pt. Agreed to prevnar 20, no pelvic or rectal issues so breast exam only.     Monique James is a 82 y.o. female who presents for annual physical exam, Medicare wellness visit and follow-up on chronic medical conditions.    Anxiety--related to diagnosis of atrial fibrillation.  On citalopram and doing very well, no side effects. Not needing any alprazolam. (Had recurrent anxiety about her pulse/BP upon trial off SSRI in the past). Atrial fib has been more long-standing with no anxiety. She tried cutting the dose to 10mg  last year--took it for a month, but had more trouble sleeping and "didn't feel right".  She is doing well on the 20mg  citalopram now.   Paroxysmal atrial fibrillation--diagnosed 11/2014.  Last saw cardiologist last month, was in sinus rhythm. She continues on Diltiazem and Eliquis. She denies chest pain, palpitations, bleeding, bruising.  She cannot tell when she is in afib. Sometimes gets a little winded (especially in the heat), such as carrying in groceries up the stairs.. Rare palpitations.  Hypertension--She is compliant with losartan and diltiazem, and denies side effects. She denies headaches, dizziness, chest pain, palpitations or edema. She tries to follow low sodium diet. BPs are running 128-135/78-84 at home.  Doesn't check too often  BP Readings from Last 3 Encounters:  06/16/23 132/84  05/25/23 130/76  05/17/23 137/77   Hyperlipidemia: Controlled by diet, which remains the same. She tries to limit her cheese intake. Occasional Caesar dressing, mostly vinaigrettes (hasn't been having salads regularly). She mostly eats chicken and occasional pork chop (2x/month), infrequent beef. She eats a few eggs/month.   She has had excellent HDL. She denies any significant dietary changes. No atherosclerosis has been noted on any imaging studies.  Lab Results  Component Value Date    CHOL 228 (H) 06/08/2022   HDL 101 06/08/2022   LDLCALC 116 (H) 06/08/2022   TRIG 62 06/08/2022   CHOLHDL 2.3 06/08/2022   Vitamin D deficiency:  Last level was 28.2 in 06/2022, when taking 2000 IU daily.  She was advised to increase dose to 4000 IU daily.  She is currently taking 2000 IU daily, and occasionally missing a dose. She continues to take MVI daily, and 1 serving of Ca with D.  Allergies:  Takes Allegra and Benadryl when she goes for her shots, and just seasonally just prn (only rarely needs). She continues on allergy shots..    Colon polyps: On colonoscopy 05/2018, 10 polyps were found, one of which was 10mm.  Path revealed seven simple adenomas, one serrated adenoma, and 2 hyperplastic polyps. No advanced changes were seen.  GI reported that no repeat colonoscopy is needed (but that her family should have colonoscopies starting at the age of 86).  She denies any GI complaints.   Obesity: She has lost weight by cutting back on her snacking, switching to popcorn instead of crackers, chips. She continues to try and eat a lot of fruit/vegetables. Tries to limit her carbs.  Occasionally has a baked potato. No pasta. She tries to watch her portions.   She continues to go to water aerobics at the [facility] 2-3 times/week. She isn't in the pool for as long as in the past, due to them not heating it, and her feeling very cold (since on Eliquis). Artificial sweetener in iced tea. Drinks V8 sometimes, not daily (small can).   Immunization History  Administered Date(s)  Administered   Moderna Sars-Covid-2 Vaccination 06/24/2020, 07/25/2020, 01/17/2021   PNEUMOCOCCAL CONJUGATE-20 06/16/2023   Pneumococcal Conjugate-13 10/07/2015   Allergic to tetanus, flu shots. Last Pap smear: n/a, s/p hysterectomy   Last mammogram: 11/2022 Last colonoscopy: 05/2018 Last DEXA: 05/2019 T-1.4 at [facility], normal FRAX scores. (prior was 01/2014 at [facility].  T-1.3) Ophtho: yearly Dentist: twice yearly    Exercise: water aerobics 2-3x/week--pool is too cold, so she only gets about 30 mins of the class. Uses weights in the pool.   No other exercise.   Patient Care Team: Joselyn Arrow, MD as PCP - General (Family Medicine) Wyline Mood Dorothe Pea, MD as PCP - Cardiology (Cardiology) Jodelle Gross, NP as Nurse Practitioner (Nurse Practitioner) [place] Bali, MD (Inactive) as Consulting Physician (Gastroenterology) Dentist: Dr. Nedra Hai Ophtho: Dr. Charise Killian (at [facility]) Allergist:  Dr. Dellis Anes Dermatologist: Dr. Margo Aye   Depression Screening: Flowsheet Row Office Visit from 06/16/2023 in [facility]  PHQ-2 Total Score 0        Falls screen:     06/16/2023    9:22 AM 06/08/2022    9:44 AM 05/29/2021    1:18 PM 05/22/2020    1:47 PM 05/08/2019    8:51 AM  Fall Risk   Falls in the past year? 0 0 0 0 0  Number falls in past yr: 0 0 0    Injury with Fall? 0 0 0    Risk for fall due to : No Fall Risks No Fall Risks No Fall Risks    Follow up Falls evaluation completed Falls evaluation completed Falls evaluation completed       Functional Status Survey: Is the patient deaf or have difficulty hearing?: No Does the patient have difficulty seeing, even when wearing glasses/contacts?: No Does the patient have difficulty concentrating, remembering, or making decisions?: No Does the patient have difficulty walking or climbing stairs?: No Does the patient have difficulty dressing or bathing?: No Does the patient have difficulty doing errands alone such as visiting a doctor's office or shopping?: No  Mini-Cog Scoring: 5     End of Life Discussion:  Patient has a living will and medical power of attorney (scanned in 11/2019).   PMH, PSH, SH and FH were reviewed and updated.  Outpatient Encounter Medications as of 06/16/2023  Medication Sig Note   apixaban (ELIQUIS) 5 MG TABS tablet Take 1 tablet (5 mg total) by mouth 2 (two) times daily.    Calcium Citrate-Vitamin D (CALCIUM + D  PO) Take 2 tablets by mouth daily. 06/08/2022: 2 tablets = 1 serving, per pt   Cholecalciferol (VITAMIN D3) 2000 units TABS Take 2,000 Units by mouth daily.     citalopram (CELEXA) 20 MG tablet Take 1 tablet (20 mg total) by mouth daily.    diltiazem (CARDIZEM CD) 180 MG 24 hr capsule TAKE 1 CAPSULE BY MOUTH ONCE DAILY - MEDICATION TO CONTROL HEART RATE    fexofenadine (ALLEGRA) 180 MG tablet Take 180 mg by mouth daily as needed for allergies (ALLERGY VACINE).    losartan (COZAAR) 50 MG tablet Take 1 tablet by mouth once daily    Multiple Vitamins-Minerals (MULTIVITAMIN WITH MINERALS) tablet Take 1 tablet by mouth daily.    diphenhydrAMINE (BENADRYL) 25 MG tablet Take 25 mg by mouth every 6 (six) hours as needed for itching or allergies (ALLERGY VACCINE). (Patient not taking: Reported on 06/16/2023) 06/16/2023: Prn before getting shots   No facility-administered encounter medications on file as of 06/16/2023.  Allergies  Allergen Reactions   Bee Venom Anaphylaxis   Epinephrine Other (See Comments)    High pulse rate.   Sulfa Antibiotics Nausea And Vomiting   Tetanus Toxoids Swelling and Other (See Comments)    Entire arm swelled   Vancomycin Nausea And Vomiting   Adhesive [Tape] Rash and Other (See Comments)    Her skin peels off.   Influenza Vaccine Live Swelling and Rash   Ivp Dye [Iodinated Contrast Media] Rash   Zyloprim [Allopurinol] Rash    ROS: The patient denies anorexia, fever, headaches, vision changes, decreased hearing, ear pain, sore throat, breast concerns, chest pain, palpitations, dizziness, syncope, dyspnea on exertion, cough, swelling, nausea, vomiting, diarrhea,abdominal pain, melena, hematochezia, hematuria, incontinence, dysuria, vaginal bleeding, discharge, odor or itch, numbness, tingling, weakness, tremor, suspicious skin lesions, abnormal bleeding/bruising, or enlarged lymph nodes. +arthritis in hands, only occasionally painful (weather-dependent), R>L ring finger  getting crooked R knee, occasional discomfort with weather changes. No pain with activity. Rare heartburn with fatty foods (rare, relieved by Tums).  Had some recently after a hamburger. Some mild constipation, controlled with high fiber diet and stool softeners prn. Anxiety is well controlled, denies depression. Sees dermatologist   PHYSICAL EXAM:  BP 132/84   Pulse 90   Ht 5' 2.75" (1.594 m)   Wt 191 lb 3.2 oz (86.7 kg)   BMI 34.14 kg/m   Wt Readings from Last 3 Encounters:  06/16/23 191 lb 3.2 oz (86.7 kg)  05/25/23 193 lb (87.5 kg)  05/17/23 193 lb 9.6 oz (87.8 kg)   06/08/22 195 lb 3.2 oz (88.5 kg)  08/14/21 203 lb (92.1 kg)  05/29/21 204 lb 3.2 oz (92.6 kg)    General Appearance   Alert, cooperative, no distress, appears stated age. Some throat-clearing and dry cough during visit  Head:    Normocephalic, without obvious abnormality, atraumatic     Eyes:    PERRL, conjunctiva and sclera are clear. EOM's intact, fundi benign   Ears:    Normal TM's and external ear canals     Nose:    Normal,no sinus tenderness . Mild edema with some clear mucus  Throat:    Normal, no lesions  Neck:    Supple, no lymphadenopathy; thyroid: no enlargement/ tenderness/nodules; no carotid bruit or JVD     Back:    Spine nontender, no curvature, ROM normal, no CVA tenderness     Lungs:    Clear to auscultation bilaterally without wheezes, rales or ronchi; respirations unlabored     Chest Wall:    No tenderness or deformity     Heart:    Regular rate and rhythm, S1 and S2 normal, no murmur, rub or gallop. Occasional ectopy noted.  Breast Exam:    No tenderness, masses, or nipple discharge or inversion. No axillary lymphadenopathy     Abdomen:    Soft, non-tender, obese, nondistended, normoactive bowel sounds, no masses, no hepatosplenomegaly     Genitalia:    Not performed today  Rectal:    Not performed today  Extremities:    No clubbing, cyanosis or edema. Some bony abnormalities c/w  arthritis (R>L ring fingers, with ulnar deviation of the middle phalanges).  Pulses:    2+ and symmetric all extremities     Skin:    Skin color, texture, turgor normal. Purpura on L elbow, bilateral forearms. Varicose veins L>R, anteriorly on lower legs  Lymph nodes:    Cervical, supraclavicular, inguinal and axillary nodes normal  Neurologic:    Normal strength, sensation and gait; reflexes 2+ and symmetric throughout              Psych:   Normal mood, affect, hygiene and grooming     06/16/2023    9:28 AM 06/08/2022    9:44 AM  GAD 7 : Generalized Anxiety Score  Nervous, Anxious, on Edge 0 0  Control/stop worrying 0 0  Worry too much - different things 0 0  Trouble relaxing 0 0  Restless 0 0  Easily annoyed or irritable 0 0  Afraid - awful might happen 0 0  Total GAD 7 Score 0 0  Anxiety Difficulty Not difficult at all Not difficult at all    ASSESSMENT/PLAN:   Annual physical exam  Medicare annual wellness visit, subsequent  Generalized anxiety disorder Assessment & Plan: Well controlled on citalopram 20mg . Didn't do well when tried tapering dose last year.  Continue 20 mg.   Atrial fibrillation with rapid ventricular response (HCC) Assessment & Plan: Paroxysmal atrial fibrillation, under the care of cardiologist.  Anticoagulated with eliquis, and on diltiazem.  Doing well on this regimen.   Acquired thrombophilia (HCC) Assessment & Plan: Due to atrial fibrillation.  Continue anticoagulation with eliquis. No reports of bleeding. Due for CBC   Anticoagulant long-term use -     CBC with Differential/Platelet -     PT and PTT  Vitamin D deficiency Assessment & Plan: Slightly low last year, and didn't bump up the dose as recommended. Recheck today.  Encouraged her to take supplements every day (put in pill box), may need 4000 IU daily.  Orders: -     VITAMIN D 25 Hydroxy (Vit-D Deficiency, Fractures)  Pure hypercholesterolemia Assessment & Plan: Controlled by  diet. Reviewed low cholesterol diet.  No known CAD or atherosclerosis.  Last LDL 116. HDL 101. Not checking this year, just continue low cholesterol diet.   Osteopenia, unspecified location Assessment & Plan: Very mild.  Last DEXA 2020, T-1.4.  Consider recheck next year (5 year f/u).  Orders: -     VITAMIN D 25 Hydroxy (Vit-D Deficiency, Fractures)  Essential hypertension Assessment & Plan: Borderline control.  Reviewed low sodium diet, encouraged daily exercise and continued weight loss. Continue diltiazem and losartan.  Orders: -     CMP14+EGFR  Senile purpura (HCC) Assessment & Plan: Likely contributed by being on blood thinners, also due to age, thin skin.   Medication monitoring encounter -     CBC with Differential/Platelet -     CMP14+EGFR -     VITAMIN D 25 Hydroxy (Vit-D Deficiency, Fractures)  Need for vaccination against Streptococcus pneumoniae -     Pneumococcal conjugate vaccine 20-valent    Pt states cardiologist wanted "coag times" checked. Send labs to [facility], [place].   Discussed monthly self breast exams and yearly mammograms; at least 30 minutes of aerobic activity at least 5 days/week, weight-bearing exercise 2-3x/wk; proper sunscreen use reviewed; healthy diet, including goals of calcium and vitamin D intake and alcohol recommendations (less than or equal to 1 drink/day--encouraged again to cut back) reviewed; regular seatbelt use; changing batteries in smoke detectors. Immunization recommendations discussed--allergic to flu shots. Shingrix recommended from pharmacy. Prevnar-20 given today. Recommended getting RSV from pharmacy in the Fall. Updated COVID booster recommended when available in the Fall Colonoscopy no longer recommended based on age, not having GI concerns.   MOST form reviewed, unchanged. Full Code, Full Care  F/u 1 year, sooner prn if elevated BP's  or other concerns    Medicare Attestation I have personally reviewed: The  patient's medical and social history Their use of alcohol, tobacco or illicit drugs Their current medications and supplements The patient's functional ability including ADLs,fall risks, home safety risks, cognitive, and hearing and visual impairment Diet and physical activities Evidence for depression or mood disorders  The patient's weight, height, BMI have been recorded in the chart.  I have made referrals, counseling, and provided education to the patient based on review of the above and I have provided the patient with a written personalized care plan for preventive services.

## 2023-06-15 NOTE — Patient Instructions (Incomplete)
HEALTH MAINTENANCE RECOMMENDATIONS:  It is recommended that you get at least 30 minutes of aerobic exercise at least 5 days/week (for weight loss, you may need as much as 60-90 minutes). This can be any activity that gets your heart rate up. This can be divided in 10-15 minute intervals if needed, but try and build up your endurance at least once a week.  Weight bearing exercise is also recommended twice weekly.  Eat a healthy diet with lots of vegetables, fruits and fiber.  "Colorful" foods have a lot of vitamins (ie green vegetables, tomatoes, red peppers, etc).  Limit sweet tea, regular sodas and alcoholic beverages, all of which has a lot of calories and sugar.  Up to 1 alcoholic drink daily may be beneficial for women (unless trying to lose weight, watch sugars).  Drink a lot of water.  Calcium recommendations are 1200-1500 mg daily (1500 mg for postmenopausal women or women without ovaries), and vitamin D 1000 IU daily.  This should be obtained from diet and/or supplements (vitamins), and calcium should not be taken all at once, but in divided doses.  Monthly self breast exams and yearly mammograms for women over the age of 63 is recommended.  Sunscreen of at least SPF 30 should be used on all sun-exposed parts of the skin when outside between the hours of 10 am and 4 pm (not just when at beach or pool, but even with exercise, golf, tennis, and yard work!)  Use a sunscreen that says "broad spectrum" so it covers both UVA and UVB rays, and make sure to reapply every 1-2 hours.  Remember to change the batteries in your smoke detectors when changing your clock times in the spring and fall. Carbon monoxide detectors are recommended for your home.  Use your seat belt every time you are in a car, and please drive safely and not be distracted with cell phones and texting while driving.   [name] , Thank you for taking time to come for your Medicare Wellness Visit. I appreciate your ongoing  commitment to your health goals. Please review the following plan we discussed and let me know if I can assist you in the future.   This is a list of the screening recommended for you and due dates:  Health Maintenance  Topic Date Due   DTaP/Tdap/Td vaccine (1 - Tdap) Never done   Zoster (Shingles) Vaccine (1 of 2) Never done   Pneumonia Vaccine (2 of 2 - PPSV23 or PCV20) 10/06/2016   COVID-19 Vaccine (4 - 2023-24 season) 07/03/2022   Medicare Annual Wellness Visit  06/15/2024   DEXA scan (bone density measurement)  Completed   HPV Vaccine  Aged Out    COVID vaccine is recommended (newly updated one coming out next month). Shingles vaccine also recommended, as previously discussed (need to get from pharmacy). RSV vaccine is recommended in the Fall (need to get from the pharmacy).  Goal blood pressure is <130/80. Try and exercise every day, as this will help blood pressure. Let us know if the blood pressure is consistently over 130-135/80-85 (or address with your cardiologist).  Please try and cut back on alcohol intake. Please increase your exercise, as discussed (daily). If you have any worsening of your getting winded with activities, please follow up here or with your cardiologist.  Same if you start having any more bleeding, bruising, fatigue.  Often I check CBC's twice yearly when on blood thinning agents.  If yours is stable and no symptoms,  we don't necessarily need to (but if your cardiologist wants that, they can arrange for it).

## 2023-06-16 ENCOUNTER — Ambulatory Visit (INDEPENDENT_AMBULATORY_CARE_PROVIDER_SITE_OTHER): Payer: Medicare Other | Admitting: Family Medicine

## 2023-06-16 ENCOUNTER — Encounter: Payer: Self-pay | Admitting: Family Medicine

## 2023-06-16 VITALS — BP 132/84 | HR 90 | Ht 62.75 in | Wt 191.2 lb

## 2023-06-16 DIAGNOSIS — E7800 Pure hypercholesterolemia, unspecified: Secondary | ICD-10-CM | POA: Diagnosis not present

## 2023-06-16 DIAGNOSIS — M8580 Other specified disorders of bone density and structure, unspecified site: Secondary | ICD-10-CM

## 2023-06-16 DIAGNOSIS — I4891 Unspecified atrial fibrillation: Secondary | ICD-10-CM

## 2023-06-16 DIAGNOSIS — Z23 Encounter for immunization: Secondary | ICD-10-CM | POA: Diagnosis not present

## 2023-06-16 DIAGNOSIS — Z0000 Encounter for general adult medical examination without abnormal findings: Secondary | ICD-10-CM | POA: Diagnosis not present

## 2023-06-16 DIAGNOSIS — D692 Other nonthrombocytopenic purpura: Secondary | ICD-10-CM

## 2023-06-16 DIAGNOSIS — Z7901 Long term (current) use of anticoagulants: Secondary | ICD-10-CM | POA: Diagnosis not present

## 2023-06-16 DIAGNOSIS — E559 Vitamin D deficiency, unspecified: Secondary | ICD-10-CM | POA: Diagnosis not present

## 2023-06-16 DIAGNOSIS — Z5181 Encounter for therapeutic drug level monitoring: Secondary | ICD-10-CM

## 2023-06-16 DIAGNOSIS — D6869 Other thrombophilia: Secondary | ICD-10-CM

## 2023-06-16 DIAGNOSIS — F411 Generalized anxiety disorder: Secondary | ICD-10-CM

## 2023-06-16 DIAGNOSIS — I10 Essential (primary) hypertension: Secondary | ICD-10-CM | POA: Diagnosis not present

## 2023-06-17 LAB — CMP14+EGFR
ALT: 8 IU/L (ref 0–32)
AST: 10 IU/L (ref 0–40)
Albumin: 4.4 g/dL (ref 3.7–4.7)
Alkaline Phosphatase: 69 IU/L (ref 44–121)
BUN/Creatinine Ratio: 13 (ref 12–28)
BUN: 11 mg/dL (ref 8–27)
Bilirubin Total: 0.5 mg/dL (ref 0.0–1.2)
CO2: 24 mmol/L (ref 20–29)
Calcium: 10 mg/dL (ref 8.7–10.3)
Chloride: 94 mmol/L — ABNORMAL LOW (ref 96–106)
Creatinine, Ser: 0.83 mg/dL (ref 0.57–1.00)
Globulin, Total: 2.6 g/dL (ref 1.5–4.5)
Glucose: 98 mg/dL (ref 70–99)
Potassium: 4.4 mmol/L (ref 3.5–5.2)
Sodium: 133 mmol/L — ABNORMAL LOW (ref 134–144)
Total Protein: 7 g/dL (ref 6.0–8.5)
eGFR: 70 mL/min/{1.73_m2} (ref >59)

## 2023-06-17 LAB — CBC WITH DIFFERENTIAL/PLATELET
Basophils Absolute: 0 10*3/uL (ref 0.0–0.2)
Basos: 1 %
EOS (ABSOLUTE): 0 10*3/uL (ref 0.0–0.4)
Eos: 0 %
Hematocrit: 44.2 % (ref 34.0–46.6)
Hemoglobin: 14.9 g/dL (ref 11.1–15.9)
Immature Grans (Abs): 0 10*3/uL (ref 0.0–0.1)
Immature Granulocytes: 0 %
Lymphocytes Absolute: 2.1 10*3/uL (ref 0.7–3.1)
Lymphs: 29 %
MCH: 33 pg (ref 26.6–33.0)
MCHC: 33.7 g/dL (ref 31.5–35.7)
MCV: 98 fL — ABNORMAL HIGH (ref 79–97)
Monocytes Absolute: 0.7 10*3/uL (ref 0.1–0.9)
Monocytes: 10 %
Neutrophils Absolute: 4.5 10*3/uL (ref 1.4–7.0)
Neutrophils: 60 %
Platelets: 306 10*3/uL (ref 150–450)
RBC: 4.52 x10E6/uL (ref 3.77–5.28)
RDW: 12 % (ref 11.7–15.4)
WBC: 7.4 10*3/uL (ref 3.4–10.8)

## 2023-06-17 LAB — VITAMIN D 25 HYDROXY (VIT D DEFICIENCY, FRACTURES): Vit D, 25-Hydroxy: 29.9 ng/mL — ABNORMAL LOW (ref 30.0–100.0)

## 2023-06-17 LAB — PT AND PTT
INR: 1 (ref 0.9–1.2)
Prothrombin Time: 11.5 s (ref 9.1–12.0)
aPTT: 31 s (ref 24–33)

## 2023-06-18 DIAGNOSIS — M8580 Other specified disorders of bone density and structure, unspecified site: Secondary | ICD-10-CM | POA: Insufficient documentation

## 2023-06-18 DIAGNOSIS — D692 Other nonthrombocytopenic purpura: Secondary | ICD-10-CM | POA: Insufficient documentation

## 2023-06-18 DIAGNOSIS — E559 Vitamin D deficiency, unspecified: Secondary | ICD-10-CM | POA: Insufficient documentation

## 2023-06-18 NOTE — Assessment & Plan Note (Signed)
Borderline control.  Reviewed low sodium diet, encouraged daily exercise and continued weight loss. Continue diltiazem and losartan.

## 2023-06-18 NOTE — Assessment & Plan Note (Signed)
Likely contributed by being on blood thinners, also due to age, thin skin.

## 2023-06-18 NOTE — Assessment & Plan Note (Signed)
Well controlled on citalopram 20mg . Didn't do well when tried tapering dose last year.  Continue 20 mg.

## 2023-06-18 NOTE — Assessment & Plan Note (Signed)
Slightly low last year, and didn't bump up the dose as recommended. Recheck today.  Encouraged her to take supplements every day (put in pill box), may need 4000 IU daily.

## 2023-06-18 NOTE — Assessment & Plan Note (Signed)
Very mild.  Last DEXA 2020, T-1.4.  Consider recheck next year (5 year f/u).

## 2023-06-18 NOTE — Assessment & Plan Note (Signed)
Paroxysmal atrial fibrillation, under the care of cardiologist.  Anticoagulated with eliquis, and on diltiazem.  Doing well on this regimen.

## 2023-06-18 NOTE — Assessment & Plan Note (Signed)
Controlled by diet. Reviewed low cholesterol diet.  No known CAD or atherosclerosis.  Last LDL 116. HDL 101. Not checking this year, just continue low cholesterol diet.

## 2023-06-18 NOTE — Assessment & Plan Note (Signed)
Due to atrial fibrillation.  Continue anticoagulation with eliquis. No reports of bleeding. Due for CBC

## 2023-08-02 ENCOUNTER — Ambulatory Visit: Payer: Medicare Other

## 2023-08-04 ENCOUNTER — Ambulatory Visit (INDEPENDENT_AMBULATORY_CARE_PROVIDER_SITE_OTHER): Payer: Medicare Other

## 2023-08-04 DIAGNOSIS — T63441D Toxic effect of venom of bees, accidental (unintentional), subsequent encounter: Secondary | ICD-10-CM | POA: Diagnosis not present

## 2023-08-17 ENCOUNTER — Other Ambulatory Visit: Payer: Self-pay | Admitting: Family Medicine

## 2023-08-17 DIAGNOSIS — F411 Generalized anxiety disorder: Secondary | ICD-10-CM

## 2023-08-31 ENCOUNTER — Other Ambulatory Visit: Payer: Self-pay | Admitting: Family Medicine

## 2023-08-31 DIAGNOSIS — I10 Essential (primary) hypertension: Secondary | ICD-10-CM

## 2023-09-29 ENCOUNTER — Ambulatory Visit (INDEPENDENT_AMBULATORY_CARE_PROVIDER_SITE_OTHER): Payer: Medicare Other | Admitting: *Deleted

## 2023-09-29 ENCOUNTER — Other Ambulatory Visit: Payer: Self-pay | Admitting: *Deleted

## 2023-09-29 DIAGNOSIS — T63441D Toxic effect of venom of bees, accidental (unintentional), subsequent encounter: Secondary | ICD-10-CM

## 2023-09-29 MED ORDER — EPINEPHRINE 0.3 MG/0.3ML IJ SOAJ: 0.3000 mg | INTRAMUSCULAR | 1 refills | Status: DC | PRN

## 2023-10-11 DIAGNOSIS — D225 Melanocytic nevi of trunk: Secondary | ICD-10-CM | POA: Diagnosis not present

## 2023-10-11 DIAGNOSIS — Z1283 Encounter for screening for malignant neoplasm of skin: Secondary | ICD-10-CM | POA: Diagnosis not present

## 2023-10-11 DIAGNOSIS — X32XXXD Exposure to sunlight, subsequent encounter: Secondary | ICD-10-CM | POA: Diagnosis not present

## 2023-10-11 DIAGNOSIS — D485 Neoplasm of uncertain behavior of skin: Secondary | ICD-10-CM | POA: Diagnosis not present

## 2023-10-11 DIAGNOSIS — L570 Actinic keratosis: Secondary | ICD-10-CM | POA: Diagnosis not present

## 2023-11-08 ENCOUNTER — Other Ambulatory Visit (HOSPITAL_COMMUNITY): Payer: Self-pay | Admitting: Family Medicine

## 2023-11-08 DIAGNOSIS — Z1231 Encounter for screening mammogram for malignant neoplasm of breast: Secondary | ICD-10-CM

## 2023-11-24 ENCOUNTER — Ambulatory Visit (INDEPENDENT_AMBULATORY_CARE_PROVIDER_SITE_OTHER): Payer: Medicare Other

## 2023-11-24 DIAGNOSIS — T63441D Toxic effect of venom of bees, accidental (unintentional), subsequent encounter: Secondary | ICD-10-CM

## 2023-12-01 ENCOUNTER — Ambulatory Visit (HOSPITAL_COMMUNITY)
Admission: RE | Admit: 2023-12-01 | Discharge: 2023-12-01 | Disposition: A | Discharge status: Home / Self Care | Payer: Medicare Other | Source: Ambulatory Visit | Attending: Family Medicine | Admitting: Family Medicine

## 2023-12-01 DIAGNOSIS — Z1231 Encounter for screening mammogram for malignant neoplasm of breast: Secondary | ICD-10-CM | POA: Diagnosis not present

## 2023-12-24 ENCOUNTER — Encounter: Payer: Self-pay | Admitting: Allergy & Immunology

## 2023-12-24 ENCOUNTER — Ambulatory Visit: Payer: Medicare Other | Admitting: Allergy & Immunology

## 2023-12-24 VITALS — BP 138/70 | HR 97 | Temp 97.7°F | Resp 14 | Ht 62.6 in | Wt 188.2 lb

## 2023-12-24 DIAGNOSIS — T63441D Toxic effect of venom of bees, accidental (unintentional), subsequent encounter: Secondary | ICD-10-CM | POA: Diagnosis not present

## 2023-12-24 NOTE — Progress Notes (Signed)
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 Venom hypersensitivity  - Continue with allergy shots at the same schedule. - EpiPen is up to date. - It seems that you are doing very well with the current schedule.  - We can always space out to every 12 weeks if you want to.   2.  Follow-up in 1 year or earlier if needed.  Subjective:   Monique James is a 83 y.o. 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  Discussed the use of AI scribe software for clinical note transcription with the patient and/or guardian, who gave verbal consent to proceed.  Monique James is a 83 y.o. female presenting for a follow up visit.  She was last seen in February 2024.  At that time, she was doing well on her immunotherapy therapy.  We refilled her EpiPen.  She was not having any reactions to it that of immunotherapy.  Since last visit, she has done well.  She is currently receiving venom  immunotherapy every eight weeks and is comfortable with this schedule.  She receives mixed vespid and wasp.  She has been on this regimen for a while and does not wish to extend the interval between shots.  She has a history of venom allergy. The last sting occurred the summer before last, resulting in significant hand swelling managed with two Benadryl, without the need for her Epipen as there was no anaphylaxis.  She does have a doctor.  She does not change her daily activities based on her hypersensitivity.  No recent ear infections, sinus infections, or pneumonia. Vaccinations are up to date.   She lives alone with her dog and used to enjoy mowing her lawn but no longer does so due to the heat. She has a daughter who plans to visit her for her upcoming 83th birthday.     Otherwise, there have been no changes to her past medical history, surgical history, family history, or social history.    Review of systems otherwise negative other than that mentioned in the HPI.    Objective:   Blood pressure 138/70, pulse 97, temperature 97.7 F (36.5 C), resp. rate 14, height 5' 2.6" (1.59 m), weight 188 lb 4 oz (85.4 kg), SpO2 92%. Body mass index is 33.78 kg/m.    Physical Exam Vitals reviewed.  Constitutional:      Appearance: She is well-developed.     Comments: Pleasant and interactive.  Cooperative with the exam.  HENT:  Head: Normocephalic and atraumatic.     Right Ear: Tympanic membrane, ear canal and external ear normal.     Left Ear: Tympanic membrane, ear canal and external ear normal.     Nose: No nasal deformity, septal deviation, mucosal edema or rhinorrhea.     Right Turbinates: Swollen and pale. Not enlarged.     Left Turbinates: Swollen and pale. Not enlarged.     Right Sinus: No maxillary sinus tenderness or frontal sinus tenderness.     Left Sinus: No maxillary sinus tenderness or frontal sinus tenderness.     Mouth/Throat:     Mouth: Mucous membranes are not  pale and not dry.     Pharynx: Uvula midline.  Eyes:     General: Lids are normal. No allergic shiner.       Right eye: No discharge.        Left eye: No discharge.     Conjunctiva/sclera: Conjunctivae normal.     Right eye: Right conjunctiva is not injected. No chemosis.    Left eye: Left conjunctiva is not injected. No chemosis.    Pupils: Pupils are equal, round, and reactive to light.  Cardiovascular:     Rate and Rhythm: Normal rate and regular rhythm.     Heart sounds: Normal heart sounds.  Pulmonary:     Effort: Pulmonary effort is normal. No tachypnea, accessory muscle usage or respiratory distress.     Breath sounds: Normal breath sounds. No wheezing, rhonchi or rales.  Chest:     Chest wall: No tenderness.  Lymphadenopathy:     Cervical: No cervical adenopathy.  Skin:    Coloration: Skin is not pale.     Findings: No abrasion, erythema, petechiae or rash. Rash is not papular, urticarial or vesicular.  Neurological:     Mental Status: She is alert.  Psychiatric:        Behavior: Behavior is cooperative.      Diagnostic studies: none     Malachi Bonds, MD  [facility]

## 2023-12-24 NOTE — Patient Instructions (Addendum)
1. Venom hypersensitivity  - Continue with allergy shots at the same schedule. - EpiPen is up to date. - It seems that you are doing very well with the current schedule.  - We can always space out to every 12 weeks if you want to.   2. Return in about 1 year (around 12/23/2024). You can have the follow up appointment with Dr. Dellis Anes or a Nurse Practicioner (our Nurse Practitioners are excellent and always have Physician oversight!).    Please inform us of any Emergency Department visits, hospitalizations, or changes in symptoms. Call us before going to the ED for breathing or allergy symptoms since we might be able to fit you in for a sick visit. Feel free to contact us anytime with any questions, problems, or concerns.  It was a pleasure to see you again today!  Websites that have reliable patient information: 1. American Academy of Asthma, Allergy, and Immunology: www.aaaai.org 2. Food Allergy Research and Education (FARE): foodallergy.org 3. Mothers of Asthmatics: http://www.asthmacommunitynetwork.org 4. American College of Allergy, Asthma, and Immunology: www.acaai.org      "Like" [place] on Facebook and Instagram for our latest updates!      A healthy democracy works best when [employer] participate! Make sure you are registered to vote! If you have moved or changed any of your contact information, you will need to get this updated before voting! Scan the QR codes below to learn more!

## 2024-01-19 ENCOUNTER — Ambulatory Visit (INDEPENDENT_AMBULATORY_CARE_PROVIDER_SITE_OTHER): Payer: Medicare Other

## 2024-01-19 DIAGNOSIS — T63441D Toxic effect of venom of bees, accidental (unintentional), subsequent encounter: Secondary | ICD-10-CM | POA: Diagnosis not present

## 2024-01-19 MED ORDER — EPINEPHRINE 0.3 MG/0.3ML IJ SOAJ: 0.3000 mg | INTRAMUSCULAR | 1 refills | Status: AC | PRN

## 2024-02-01 ENCOUNTER — Other Ambulatory Visit: Payer: Self-pay | Admitting: Cardiology

## 2024-02-02 NOTE — Telephone Encounter (Signed)
 Prescription refill request for Eliquis received. Indication:afib Last office visit:7/24 Scr:0.83  8/24 Age: 83 Weight:85.4  kg  Prescription refilled

## 2024-03-13 ENCOUNTER — Ambulatory Visit (INDEPENDENT_AMBULATORY_CARE_PROVIDER_SITE_OTHER)

## 2024-03-13 DIAGNOSIS — T63441D Toxic effect of venom of bees, accidental (unintentional), subsequent encounter: Secondary | ICD-10-CM

## 2024-04-06 DIAGNOSIS — D225 Melanocytic nevi of trunk: Secondary | ICD-10-CM | POA: Diagnosis not present

## 2024-04-06 DIAGNOSIS — L821 Other seborrheic keratosis: Secondary | ICD-10-CM | POA: Diagnosis not present

## 2024-04-06 DIAGNOSIS — Z1283 Encounter for screening for malignant neoplasm of skin: Secondary | ICD-10-CM | POA: Diagnosis not present

## 2024-05-08 ENCOUNTER — Ambulatory Visit (INDEPENDENT_AMBULATORY_CARE_PROVIDER_SITE_OTHER)

## 2024-05-08 DIAGNOSIS — T63441D Toxic effect of venom of bees, accidental (unintentional), subsequent encounter: Secondary | ICD-10-CM

## 2024-05-11 DIAGNOSIS — H43393 Other vitreous opacities, bilateral: Secondary | ICD-10-CM | POA: Diagnosis not present

## 2024-05-13 ENCOUNTER — Other Ambulatory Visit: Payer: Self-pay | Admitting: Family Medicine

## 2024-05-13 DIAGNOSIS — F411 Generalized anxiety disorder: Secondary | ICD-10-CM

## 2024-05-28 ENCOUNTER — Other Ambulatory Visit: Payer: Self-pay | Admitting: Family Medicine

## 2024-05-28 DIAGNOSIS — I10 Essential (primary) hypertension: Secondary | ICD-10-CM

## 2024-06-16 ENCOUNTER — Other Ambulatory Visit: Payer: Self-pay | Admitting: Physician Assistant

## 2024-06-26 ENCOUNTER — Ambulatory Visit (INDEPENDENT_AMBULATORY_CARE_PROVIDER_SITE_OTHER)

## 2024-06-26 ENCOUNTER — Ambulatory Visit: Payer: Medicare Other | Admitting: Family Medicine

## 2024-06-26 DIAGNOSIS — T63441D Toxic effect of venom of bees, accidental (unintentional), subsequent encounter: Secondary | ICD-10-CM

## 2024-07-05 ENCOUNTER — Ambulatory Visit: Admitting: Cardiology

## 2024-07-06 ENCOUNTER — Ambulatory Visit: Attending: Cardiology | Admitting: Cardiology

## 2024-07-06 ENCOUNTER — Encounter: Payer: Self-pay | Admitting: Cardiology

## 2024-07-06 VITALS — BP 136/80 | HR 88 | Ht 64.0 in | Wt 181.0 lb

## 2024-07-06 DIAGNOSIS — D6869 Other thrombophilia: Secondary | ICD-10-CM

## 2024-07-06 DIAGNOSIS — I10 Essential (primary) hypertension: Secondary | ICD-10-CM

## 2024-07-06 DIAGNOSIS — I480 Paroxysmal atrial fibrillation: Secondary | ICD-10-CM | POA: Diagnosis not present

## 2024-07-06 NOTE — Patient Instructions (Signed)
Medication Instructions:  Your physician recommends that you continue on your current medications as directed. Please refer to the Current Medication list given to you today.   Labwork: None today  Testing/Procedures: None today  Follow-Up: 1 year Dr.Branch  Any Other Special Instructions Will Be Listed Below (If Applicable).  If you need a refill on your cardiac medications before your next appointment, please call your pharmacy.

## 2024-07-06 NOTE — Progress Notes (Signed)
 Clinical Summary Ms. Loveridge is a 83 y.o.female seen today for follow up of the following medical problems.   1. PAF -denies any palpitations - compliant with meds. No bleeding on eliquis     2. HTN -compliant with meds - home bp's 120s-130s/70s-80s     Labs followed by pcp    SH: loves dogs, has lost 2 dogs within the last few years but got a new dog about 2 years ago. 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 Asa)   Colon polyps    Dr. Celestia   Colon polyps    path was small leiomyoma; tubular adenoma 12/2017   Constipation    Gout    history of ([id number])   Hemorrhoids    History of IBS    Hx of pelvic mass    complex, benign pathology   Hyperlipidemia    elevated trigs and LDL   Hypertension    Hyponatremia    mild   Osteopenia    mild (T-1.3 in 01/2014)   Shingles 02/2015     Allergies  Allergen Reactions   Bee Venom Anaphylaxis   Epinephrine  Other (See Comments)    High pulse rate.   Sulfa Antibiotics Nausea And Vomiting   Tetanus Toxoids Swelling and Other (See Comments)    Entire arm swelled   Vancomycin Nausea And Vomiting   Adhesive [Tape] Rash and Other (See Comments)    Her skin peels off.   Influenza Vaccine Live Swelling and Rash   Ivp Dye [Iodinated Contrast Media] Rash   Zyloprim [Allopurinol] Rash     Current Outpatient Medications  Medication Sig Dispense Refill   apixaban  (ELIQUIS ) 5 MG TABS tablet Take 1 tablet by mouth twice daily 180 tablet 1   Calcium Citrate-Vitamin D  (CALCIUM + D PO) Take 2 tablets by mouth daily.     Cholecalciferol (VITAMIN D3) 2000 units TABS Take 2,000 Units by mouth daily.      citalopram  (CELEXA ) 20 MG tablet Take 1 tablet by mouth once daily 90 tablet 0   diltiazem  (CARDIZEM  CD) 180 MG 24 hr capsule TAKE 1 CAPSULE BY MOUTH ONCE DAILY TO   HELP  CONTROL  HEART  RATE. 30 capsule 0   diphenhydrAMINE (BENADRYL) 25 MG tablet Take 25 mg by mouth every 6 (six) hours as needed for itching or allergies (ALLERGY VACCINE).     EPINEPHrine  (EPIPEN  2-PAK) 0.3 mg/0.3 mL IJ SOAJ injection Inject 0.3 mg into the muscle as needed for anaphylaxis. 0.3 mL 1   fexofenadine (ALLEGRA) 180 MG tablet Take 180 mg by mouth daily as needed for allergies (ALLERGY VACINE).     losartan  (COZAAR ) 50 MG tablet Take 1 tablet by mouth once daily 90 tablet 0   Multiple Vitamins-Minerals (MULTIVITAMIN WITH MINERALS) tablet Take 1 tablet by mouth daily.     No current facility-administered medications for this visit.     Past Surgical History:  Procedure Laterality Date   ABDOMINAL HYSTERECTOMY  1970   for perforated uterus from IUD   BILATERAL SALPINGOOPHORECTOMY  2005   benign tumor/cyst (Dr. Gretta Sierra)   COLONOSCOPY N/A 12/31/2017   Procedure: COLONOSCOPY;  Surgeon: Harvey Margo CROME, MD;  [facility];  Service: Endoscopy;  Laterality: N/A;  10:30am   COLONOSCOPY N/A 05/23/2018  Procedure: COLONOSCOPY;  Surgeon: Harvey Margo CROME, MD;  Location: [facility];  Service: Endoscopy;  Laterality: N/A;  8:30am   POLYPECTOMY  05/23/2018   Procedure: POLYPECTOMY;  Surgeon: Harvey Margo CROME, MD;  Location: [facility];  Service: Endoscopy;;  ascending,hepatic, sigmoid,transverse     Allergies  Allergen Reactions   Bee Venom Anaphylaxis   Epinephrine  Other (See Comments)    High pulse rate.   Sulfa Antibiotics Nausea And Vomiting   Tetanus Toxoids Swelling and Other (See Comments)    Entire arm swelled   Vancomycin Nausea And Vomiting   Adhesive [Tape] Rash and Other (See Comments)    Her skin peels off.   Influenza Vaccine Live Swelling and Rash   Ivp Dye [Iodinated Contrast Media] Rash   Zyloprim [Allopurinol] Rash      Family History  Problem Relation Age of Onset   Hypertension Mother    Heart disease Mother        MI   Dementia  Mother    Cancer Father        stomach, spread to colon   Cancer Brother        multiple myeloma   Healthy Daughter    Healthy Daughter    Healthy Daughter    Diabetes Neg Hx    Breast cancer Neg Hx    Colon cancer Neg Hx      Social History Ms. Hehl reports that she has never smoked. She has never used smokeless tobacco. Ms. Semidey reports current alcohol use.     Physical Examination Today's Vitals   07/06/24 0907  BP: 136/80  Pulse: 88  SpO2: 98%  Weight: 181 lb (82.1 kg)  Height: 5' 4 (1.626 m)   Body mass index is 31.07 kg/m.  Gen: resting comfortably, no acute distress HEENT: no scleral icterus, pupils equal round and reactive, no palptable cervical adenopathy,  CV: RRR, no mrg, no jvd Resp: Clear to auscultation bilaterally GI: abdomen is soft, non-tender, non-distended, normal bowel sounds, no hepatosplenomegaly MSK: extremities are warm, no edema.  Skin: warm, no rash Neuro:  no focal deficits Psych: appropriate affect   Diagnostic Studies  Jan 2016 echo Study Conclusions  - Left ventricle: The cavity size was normal. Wall thickness was   increased in a pattern of moderate LVH. Systolic function was   vigorous. The estimated ejection fraction was in the range of 65%   to 70%. Diastolic function is abnormal, indeterminant grade. Wall   motion was normal; there were no regional wall motion   abnormalities. - Aortic valve: Mildly calcified annulus. Trileaflet; mildly   thickened leaflets. - Mitral valve: Mildly calcified annulus. Mildly thickened leaflets     Assessment and Plan   1. Afib/acquried thrombophilia - no symptoms - EKG today shows NSR - continue current meds including eliquis  for stroke prevention     2. HTN - bp is at goal, continue current meds    Dorn PHEBE Ross, M.D.

## 2024-07-18 NOTE — Progress Notes (Unsigned)
 No chief complaint on file.  Monique James is a 83 y.o. female who presents for annual physical exam, Medicare wellness visit and follow-up on chronic medical conditions.    Anxiety--related to diagnosis of atrial fibrillation.  On citalopram  and doing very well, no side effects. Not needing any alprazolam . (Had recurrent anxiety about her pulse/BP upon trial off SSRI in the past). She tried cutting the dose to 10mg  in the past, now that the afib has been long-standing and doesn't trigger anxiety.  She took the lower dose for a month, but had more trouble sleeping and didn't feel right.  She continues to do well on the 20mg  citalopram , declines changes.   Paroxysmal atrial fibrillation--diagnosed 11/2014.  Last saw cardiologist earlier this month, was in sinus rhythm. She continues on Diltiazem  and Eliquis . She denies chest pain, palpitations, bleeding, bruising.  She cannot tell when she is in afib. Sometimes gets a little winded (especially in the heat), such as carrying in groceries up the stairs.. Rare palpitations.  Hypertension--She is compliant with losartan  and diltiazem , and denies side effects. She denies headaches, dizziness, chest pain, palpitations or edema. She tries to follow low sodium diet. BPs are running ***   BP Readings from Last 3 Encounters:  07/06/24 136/80  12/24/23 138/70  06/16/23 132/84   Hyperlipidemia: Controlled by diet, which remains the same. She tries to limit her cheese intake. Occasional Caesar dressing, mostly vinaigrettes (hasn't been having salads regularly). She mostly eats chicken and occasional pork chop (2x/month), infrequent beef. She eats a few eggs/month.   She has had excellent HDL. She denies any significant dietary changes. No atherosclerosis has been noted on any imaging studies.  Lab Results  Component Value Date   CHOL 228 (H) 06/08/2022   HDL 101 06/08/2022   LDLCALC 116 (H) 06/08/2022   TRIG 62 06/08/2022   CHOLHDL 2.3  06/08/2022   Vitamin D  deficiency:  Last level was 29.9, when taking 2000 IU daily, with occasional missed dose.  It had been low on the same dose a year prior, and it had been recommended for her to take 4000 IU daily. She is currently taking **** 4000??  She continues to take MVI daily, and Ca with D once daily.  Component Ref Range & Units (hover) 1 yr ago 2 yr ago 4 yr ago 5 yr ago 6 yr ago 7 yr ago 8 yr ago  Vit D, 25-Hydroxy 29.9 Low  28.2 Low  CM 32.3 CM 30.2 CM 26.3 Low  CM 30 R, CM 28 Low  R, CM    Allergies:  Takes Allegra and Benadryl when she goes for her shots, and just seasonally just prn (only rarely needs). She continues on allergy shots..    Colon polyps: On colonoscopy 05/2018, 10 polyps were found, one of which was 10mm.  Path revealed seven simple adenomas, one serrated adenoma, and 2 hyperplastic polyps. No advanced changes were seen.  GI reported that no repeat colonoscopy is needed (but that her family should have colonoscopies starting at the age of 39).  She denies any GI complaints.   Obesity: She has lost weight by cutting back on her snacking, switching to popcorn instead of crackers, chips. She continues to try and eat a lot of fruit/vegetables. Tries to limit her carbs.  Occasionally has a baked potato. No pasta. She tries to watch her portions.   She continues to go to water  aerobics at [facility] 2-3 times/week. She isn't in the pool for  as long as in the past, due to them not heating it, and her feeling very cold (since on Eliquis ). She uses artificial sweetener in iced tea. Drinks V8 sometimes, not daily (small can).     Immunization History  Administered Date(s) Administered   Moderna Sars-Covid-2 Vaccination 06/24/2020, 07/25/2020, 01/17/2021   PNEUMOCOCCAL CONJUGATE-20 06/16/2023   Pneumococcal Conjugate-13 10/07/2015   Allergic to tetanus, flu shots. Last Pap smear: n/a, s/p hysterectomy   Last mammogram: 11/2023 Last colonoscopy: 05/2018 Last DEXA:  05/2019 T-1.4 at [facility], normal FRAX scores. (prior was 01/2014 at [facility].  T-1.3) Ophtho: yearly Dentist: twice yearly   Exercise:   water  aerobics 2-3x/week--pool is too cold, so she only gets about 30 mins of the class. Uses weights in the pool.   No other exercise.   Patient Care Team: Randol Dawes, MD as PCP - General (Family Medicine) Alvan, Dorn FALCON, MD as PCP - Cardiology (Cardiology) Jerilynn Lamarr HERO, NP as Nurse Practitioner (Nurse Practitioner) Harvey Margo CROME, MD (Inactive) as Consulting Physician (Gastroenterology) Dentist: Dr. Jama Ophtho: Dr. Darroll (at [facility]) Allergist:  Dr. Iva Dermatologist: Dr. Shona   Depression Screening: Flowsheet Row Office Visit from 06/16/2023 in [facility]  PHQ-2 Total Score 0     Falls screen:     06/16/2023    9:22 AM 06/08/2022    9:44 AM 05/29/2021    1:18 PM 05/22/2020    1:47 PM 05/08/2019    8:51 AM  Fall Risk   Falls in the past year? 0 0 0 0 0   Number falls in past yr: 0 0 0    Injury with Fall? 0 0 0    Risk for fall due to : No Fall Risks No Fall Risks No Fall Risks    Follow up Falls evaluation completed Falls evaluation completed  Falls evaluation completed        Data saved with a previous flowsheet row definition     Functional Status Survey:          End of Life Discussion:  Patient has a living will and medical power of attorney (scanned in 11/2019).   PMH, PSH, SH and FH were reviewed and updated.     ROS: The patient denies anorexia, fever, headaches, vision changes, decreased hearing, ear pain, sore throat, breast concerns, chest pain, palpitations, dizziness, syncope, dyspnea on exertion, cough, swelling, nausea, vomiting, diarrhea,abdominal pain, melena, hematochezia, hematuria, incontinence, dysuria, vaginal bleeding, discharge, odor or itch, numbness, tingling, weakness, tremor, suspicious skin lesions, abnormal bleeding/bruising, or enlarged lymph nodes.  +arthritis  in hands, only occasionally painful (weather-dependent), R>L ring finger getting crooked R knee, occasional discomfort with weather changes. No pain with activity. Rare heartburn with fatty foods (rare, relieved by Tums).  Had some recently after a hamburger. Some mild constipation, controlled with high fiber diet and stool softeners prn. Anxiety is well controlled, denies depression. Sees dermatologist. +intentional weight loss (10# in the last year)   PHYSICAL EXAM:  There were no vitals taken for this visit.  Wt Readings from Last 3 Encounters:  07/06/24 181 lb (82.1 kg)  12/24/23 188 lb 4 oz (85.4 kg)  06/16/23 191 lb 3.2 oz (86.7 kg)   06/16/23 191 lb 3.2 oz (86.7 kg)   06/08/22 195 lb 3.2 oz (88.5 kg)  08/14/21 203 lb (92.1 kg)  05/29/21 204 lb 3.2 oz (92.6 kg)    General Appearance   Alert, cooperative, no distress, appears stated age.  Head:    Normocephalic, without obvious abnormality, atraumatic     Eyes:    PERRL, conjunctiva and sclera are clear. EOM's intact, fundi benign   Ears:    Normal TM's and external ear canals     Nose:    No drainage or sinus tenderness .  Throat:    Normal, no lesions  Neck:    Supple, no lymphadenopathy; thyroid : no enlargement/ tenderness/nodules; no carotid bruit or JVD     Back:    Spine nontender, no curvature, ROM normal, no CVA tenderness     Lungs:    Clear to auscultation bilaterally without wheezes, rales or ronchi; respirations unlabored     Chest Wall:    No tenderness or deformity     Heart:    Regular rate and rhythm, S1 and S2 normal, no murmur, rub or gallop. Occasional ectopy noted.  Breast Exam:    No tenderness, masses, or nipple discharge or inversion. No axillary lymphadenopathy     Abdomen:    Soft, non-tender, obese, nondistended, normoactive bowel sounds, no masses, no hepatosplenomegaly     Genitalia:    Not performed today ***  Rectal:    Not performed today ***  Extremities:    No clubbing, cyanosis or  edema. Some bony abnormalities c/w arthritis (R>L ring fingers, with ulnar deviation of the middle phalanges).  Pulses:    2+ and symmetric all extremities     Skin:    Skin color, texture, turgor normal.  Varicose veins L>R, anteriorly on lower legs. Purpura ***  Lymph nodes:    Cervical, supraclavicular, inguinal and axillary nodes normal     Neurologic:    Normal strength, sensation and gait; reflexes 2+ and symmetric throughout              Psych:   Normal mood, affect, hygiene and grooming  ***THROAT clearing/dry cough?? Occ ectopy?UPDATE extremities UPDATE skin--purpura, VV UPDATE IF GU/RECTAL DONE  From 06/2022: Genitalia:    Normal external genitalia without lesions, mild atrophic changes. BUS and vagina normal; No abnormal vaginal discharge. Bimanual exam was normal, without any masses appreciable, nontender     Rectal:    Normal sphincter tone, no masses. Heme negative stool        06/16/2023    9:28 AM 06/08/2022    9:44 AM  GAD 7 : Generalized Anxiety Score  Nervous, Anxious, on Edge 0 0  Control/stop worrying 0 0  Worry too much - different things 0 0  Trouble relaxing 0 0  Restless 0 0  Easily annoyed or irritable 0 0  Afraid - awful might happen 0 0  Total GAD 7 Score 0 0  Anxiety Difficulty Not difficult at all Not difficult at all    ASSESSMENT/PLAN:   Gad-7 No pelvic done last year, last done in 2023.  Recommended exam today, but pt can decline if no symptoms/concerns.  Any COVID vaccines or RSV?   ***UPDATE below if she cut back on ETOH  Discussed monthly self breast exams and yearly mammograms; at least 30 minutes of aerobic activity at least 5 days/week, weight-bearing exercise 2-3x/wk; proper sunscreen use reviewed; healthy diet, including goals of calcium and vitamin D  intake and alcohol recommendations (less than or equal to 1 drink/day--encouraged again to cut back) reviewed; regular seatbelt use; changing batteries in smoke detectors. Immunization  recommendations discussed--allergic to flu shots. Shingrix recommended from pharmacy. Recommended getting RSV from pharmacy. COVID booster recommended. Colonoscopy no longer recommended based on age,  not having GI concerns.   MOST form reviewed, unchanged. Full Code, Full Care  F/u 1 year, sooner prn if elevated BP's or other concerns    Medicare Attestation I have personally reviewed: The patient's medical and social history Their use of alcohol, tobacco or illicit drugs Their current medications and supplements The patient's functional ability including ADLs,fall risks, home safety risks, cognitive, and hearing and visual impairment Diet and physical activities Evidence for depression or mood disorders  The patient's weight, height, BMI have been recorded in the chart.  I have made referrals, counseling, and provided education to the patient based on review of the above and I have provided the patient with a written personalized care plan for preventive services.

## 2024-07-18 NOTE — Patient Instructions (Incomplete)
 HEALTH MAINTENANCE RECOMMENDATIONS:  It is recommended that you get at least 30 minutes of aerobic exercise at least 5 days/week (for weight loss, you may need as much as 60-90 minutes). This can be any activity that gets your heart rate up. This can be divided in 10-15 minute intervals if needed, but try and build up your endurance at least once a week.  Weight bearing exercise is also recommended twice weekly.  Eat a healthy diet with lots of vegetables, fruits and fiber.  Colorful foods have a lot of vitamins (ie green vegetables, tomatoes, red peppers, etc).  Limit sweet tea, regular sodas and alcoholic beverages, all of which has a lot of calories and sugar.  Up to 1 alcoholic drink daily may be beneficial for women (unless trying to lose weight, watch sugars).  Drink a lot of water .  Calcium recommendations are 1200-1500 mg daily (1500 mg for postmenopausal women or women without ovaries), and vitamin D  1000 IU daily.  This should be obtained from diet and/or supplements (vitamins), and calcium should not be taken all at once, but in divided doses.  Monthly self breast exams and yearly mammograms for women over the age of 20 is recommended.  Sunscreen of at least SPF 30 should be used on all sun-exposed parts of the skin when outside between the hours of 10 am and 4 pm (not just when at beach or pool, but even with exercise, golf, tennis, and yard work!)  Use a sunscreen that says broad spectrum so it covers both UVA and UVB rays, and make sure to reapply every 1-2 hours.  Remember to change the batteries in your smoke detectors when changing your clock times in the spring and fall. Carbon monoxide detectors are recommended for your home.  Use your seat belt every time you are in a car, and please drive safely and not be distracted with cell phones and texting while driving.   Monique James , Thank you for taking time to come for your Medicare Wellness Visit. I appreciate your ongoing  commitment to your health goals. Please review the following plan we discussed and let me know if I can assist you in the future.   This is a list of the screening recommended for you and due dates:  Health Maintenance  Topic Date Due   DTaP/Tdap/Td vaccine (1 - Tdap) Never done   COVID-19 Vaccine (4 - 2025-26 season) 08/03/2024*   Zoster (Shingles) Vaccine (1 of 2) 10/17/2024*   Medicare Annual Wellness Visit  07/19/2025   Pneumococcal Vaccine for age over 62  Completed   DEXA scan (bone density measurement)  Completed   HPV Vaccine  Aged Out   Meningitis B Vaccine  Aged Out  *Topic was postponed. The date shown is not the original due date.   We put in an order for another bone density test to be done at [facility]. If nobody contact you about this, call them to get it scheduled.  RSV vaccine is recommended; you need to get this vaccine from the pharmacy. COVID vaccine is recommended. Shingrix is recommended (series of 2 vaccines given 2 doses aprt), to get from the pharmacy.  We discussed that if you'd like to try tapering down the citalopram  dose, you likely should try to taper it more slowly than the last time you tried.  Don't jump to 1/2 tablet daily all at once, take it slower (1/2 tablet once a week, with a full tablet the rest of the time,  and then increasing the frequency of the 1/2 tablet gradually, if tolerated, until you are on 1/2 pill every day, if you can).  It is important that you take the vitamin D  every day (we have said 4000-5000 IU daily, since 2000 hasn't gotten your levels up adequately in the past).  Let's wait to see today's results, but I'm guessing I'll have the same recommendation.  Consider using a pill box (and getting gel tablets; you can switch to a 5000 IU dose to take fewer pills). Double up if you forget (not taking any more than 10,000 IU on a given today).  Since you are no longer going to water  aerobics classes, please try and make sure that you get  weight-bearing exercise at least 2x/week. I encourage you to look into their Silver Sneaker classes, and try and get some cardio daily (even if walking in place at home).  I encourage you to please cut back on alcohol intake (max of 1 glass of wine daily, but ideally very little).

## 2024-07-19 ENCOUNTER — Ambulatory Visit (INDEPENDENT_AMBULATORY_CARE_PROVIDER_SITE_OTHER): Admitting: Family Medicine

## 2024-07-19 ENCOUNTER — Encounter: Payer: Self-pay | Admitting: Family Medicine

## 2024-07-19 VITALS — BP 120/70 | HR 64 | Ht 62.5 in | Wt 180.8 lb

## 2024-07-19 DIAGNOSIS — Z6832 Body mass index (BMI) 32.0-32.9, adult: Secondary | ICD-10-CM | POA: Diagnosis not present

## 2024-07-19 DIAGNOSIS — I10 Essential (primary) hypertension: Secondary | ICD-10-CM | POA: Diagnosis not present

## 2024-07-19 DIAGNOSIS — F411 Generalized anxiety disorder: Secondary | ICD-10-CM | POA: Diagnosis not present

## 2024-07-19 DIAGNOSIS — Z0000 Encounter for general adult medical examination without abnormal findings: Secondary | ICD-10-CM

## 2024-07-19 DIAGNOSIS — Z5181 Encounter for therapeutic drug level monitoring: Secondary | ICD-10-CM

## 2024-07-19 DIAGNOSIS — E559 Vitamin D deficiency, unspecified: Secondary | ICD-10-CM | POA: Diagnosis not present

## 2024-07-19 DIAGNOSIS — Z7901 Long term (current) use of anticoagulants: Secondary | ICD-10-CM | POA: Diagnosis not present

## 2024-07-19 DIAGNOSIS — E66811 Obesity, class 1: Secondary | ICD-10-CM | POA: Diagnosis not present

## 2024-07-19 DIAGNOSIS — M8580 Other specified disorders of bone density and structure, unspecified site: Secondary | ICD-10-CM

## 2024-07-19 DIAGNOSIS — E7800 Pure hypercholesterolemia, unspecified: Secondary | ICD-10-CM | POA: Diagnosis not present

## 2024-07-19 DIAGNOSIS — E6609 Other obesity due to excess calories: Secondary | ICD-10-CM | POA: Diagnosis not present

## 2024-07-19 DIAGNOSIS — D6869 Other thrombophilia: Secondary | ICD-10-CM | POA: Diagnosis not present

## 2024-07-19 DIAGNOSIS — I4891 Unspecified atrial fibrillation: Secondary | ICD-10-CM

## 2024-07-19 NOTE — Assessment & Plan Note (Signed)
 Has been consistently mildly low when taking 2000 IU daily (with occasionally missing a pill).  Currently taking 4000 IU 4x/week (can't remember to take daily). Recheck level and adjust as indicated. Discussed using pill box, getting 5000 IU pill to minimize pills (she likes the chews she is taking), doubling up after a missed dose.

## 2024-07-19 NOTE — Assessment & Plan Note (Signed)
 Well controlled on citalopram  20mg . Didn't do well when tried tapering dose 2 years ago. Discussed possibility of doing a slower taper (1/2 tab just once a week to start, gradually weaning), if desired.

## 2024-07-19 NOTE — Assessment & Plan Note (Signed)
 Well controlled. Continue low sodium diet, encouraged daily exercise and continued weight loss. Continue diltiazem  and losartan .

## 2024-07-19 NOTE — Assessment & Plan Note (Signed)
Paroxysmal atrial fibrillation, under the care of cardiologist.  Anticoagulated with eliquis, and on diltiazem.  Doing well on this regimen.

## 2024-07-19 NOTE — Assessment & Plan Note (Addendum)
 BMI 32.54 today. Congratulated on weight loss by cutting back on snacking, eating less.   Discussed the need for ongoing physical activity, and alternatives to water  aerobics. Recommended 150 minutes of cardio weekly (classes, exercise bike, walking, dancing), and weight-bearing exercise at least 2x/week. Encouraged high fiber diet, high protein intake, proper portions. Discussed waist circumference and need to lose further from abdomen.

## 2024-07-19 NOTE — Assessment & Plan Note (Signed)
 This has been mild.  Recommend recheck DEXA, as last was 5 years ago. Reviewed calcium recommendations, vitamin D , and weight-bearing exercise.  Encouraged her to do silver sneaker classes vs other weight-bearing exercise, since she is no longer going to water  aerobics classes

## 2024-07-19 NOTE — Assessment & Plan Note (Signed)
Due to atrial fibrillation.  Continue anticoagulation with eliquis. No reports of bleeding. Due for CBC

## 2024-07-19 NOTE — Assessment & Plan Note (Signed)
 Controlled by diet, which hasn't changed. Reviewed low cholesterol diet.  No known CAD or atherosclerosis.  Last LDL 116. HDL 101. Declines recheck this year, continue low cholesterol diet.

## 2024-07-20 ENCOUNTER — Ambulatory Visit: Payer: Self-pay | Admitting: Family Medicine

## 2024-07-20 LAB — CBC WITH DIFFERENTIAL/PLATELET
Basophils Absolute: 0 x10E3/uL (ref 0.0–0.2)
Basos: 1 %
EOS (ABSOLUTE): 0.2 x10E3/uL (ref 0.0–0.4)
Eos: 2 %
Hematocrit: 44.5 % (ref 34.0–46.6)
Hemoglobin: 14.8 g/dL (ref 11.1–15.9)
Immature Grans (Abs): 0 x10E3/uL (ref 0.0–0.1)
Immature Granulocytes: 0 %
Lymphocytes Absolute: 2.2 x10E3/uL (ref 0.7–3.1)
Lymphs: 33 %
MCH: 33 pg (ref 26.6–33.0)
MCHC: 33.3 g/dL (ref 31.5–35.7)
MCV: 99 fL — ABNORMAL HIGH (ref 79–97)
Monocytes Absolute: 0.6 x10E3/uL (ref 0.1–0.9)
Monocytes: 10 %
Neutrophils Absolute: 3.6 x10E3/uL (ref 1.4–7.0)
Neutrophils: 54 %
Platelets: 335 x10E3/uL (ref 150–450)
RBC: 4.49 x10E6/uL (ref 3.77–5.28)
RDW: 12 % (ref 11.7–15.4)
WBC: 6.6 x10E3/uL (ref 3.4–10.8)

## 2024-07-20 LAB — COMPREHENSIVE METABOLIC PANEL WITH GFR
ALT: 10 IU/L (ref 0–32)
AST: 18 IU/L (ref 0–40)
Albumin: 4.2 g/dL (ref 3.7–4.7)
Alkaline Phosphatase: 77 IU/L (ref 48–129)
BUN/Creatinine Ratio: 11 — ABNORMAL LOW (ref 12–28)
BUN: 9 mg/dL (ref 8–27)
Bilirubin Total: 0.5 mg/dL (ref 0.0–1.2)
CO2: 22 mmol/L (ref 20–29)
Calcium: 9.9 mg/dL (ref 8.7–10.3)
Chloride: 96 mmol/L (ref 96–106)
Creatinine, Ser: 0.8 mg/dL (ref 0.57–1.00)
Globulin, Total: 2.8 g/dL (ref 1.5–4.5)
Glucose: 90 mg/dL (ref 70–99)
Potassium: 4.7 mmol/L (ref 3.5–5.2)
Sodium: 134 mmol/L (ref 134–144)
Total Protein: 7 g/dL (ref 6.0–8.5)
eGFR: 73 mL/min/1.73 (ref >59)

## 2024-07-20 LAB — VITAMIN D 25 HYDROXY (VIT D DEFICIENCY, FRACTURES): Vit D, 25-Hydroxy: 30.5 ng/mL (ref 30.0–100.0)

## 2024-07-24 ENCOUNTER — Ambulatory Visit (HOSPITAL_COMMUNITY)
Admission: RE | Admit: 2024-07-24 | Discharge: 2024-07-24 | Disposition: A | Discharge status: Home / Self Care | Source: Ambulatory Visit | Attending: Family Medicine | Admitting: Family Medicine

## 2024-07-24 DIAGNOSIS — Z13820 Encounter for screening for osteoporosis: Secondary | ICD-10-CM | POA: Insufficient documentation

## 2024-07-24 DIAGNOSIS — M8589 Other specified disorders of bone density and structure, multiple sites: Secondary | ICD-10-CM | POA: Diagnosis not present

## 2024-07-24 DIAGNOSIS — M8580 Other specified disorders of bone density and structure, unspecified site: Secondary | ICD-10-CM | POA: Diagnosis present

## 2024-08-01 ENCOUNTER — Other Ambulatory Visit: Payer: Self-pay | Admitting: Cardiology

## 2024-08-01 NOTE — Telephone Encounter (Signed)
 Prescription refill request for Eliquis  received. Indication: PAF Last office visit: 07/06/24  JINNY Ross MD Scr: 0.80 on 07/19/24  Epic Age: 83 Weight: 82.1kg  Based on above findings Eliquis  5mg  twice daily is the appropriate dose.  Refill approved.

## 2024-08-06 NOTE — Progress Notes (Unsigned)
 No chief complaint on file.  Patient presents to discuss recent bone density results. DEXA 07/2024 showed osteopenia with elevated FRAX.  T-2.3 at L femoral neck, -1.4 at L total hip. T-1.8 at R femoral neck, -1.3 at R total hip T-1.9 at L forearm radius   There was decline from prior DEXA   FRAX 10-YEAR PROBABILITY OF FRACTURE:  Major osteoporotic fracture: 16.7% Hip fracture: 5.5%    Vitamin D  deficiency:  Last level was 30.5, at her recent physical, when taking MVI daily, and Ca with D once daily.  Prior level had been 29.9 the year prior, when taking 2000 IU daily, with occasional missed dose.  She reports taking 4000 IU about 4x/week, forgets about 3x/week. She has a chew that tastes good. She continues to take MVI daily, and Ca with D once daily.  Component Ref Range & Units (hover) 2 wk ago 1 yr ago 2 yr ago 4 yr ago 5 yr ago 6 yr ago 7 yr ago  Vit D, 25-Hydroxy 30.5 29.9 Low  CM 28.2 Low  CM 32.3 CM 30.2 CM 26.3 Low  CM 30 R, CM      PMH, PSH, SH reviewed   ROS:    PHYSICAL EXAM:  There were no vitals taken for this visit.  Wt Readings from Last 3 Encounters:  07/19/24 180 lb 12.8 oz (82 kg)  07/06/24 181 lb (82.1 kg)  12/24/23 188 lb 4 oz (85.4 kg)       ASSESSMENT/PLAN:   Discussed osteopenia, with increased risk of hip fracture (10 year risk) based on FRAX. Discussed bisphosphonates--weekly, monthly, yearly infusion. Discussed how to take, risks, side effects, length of treatment.  Discussed the importance of adequate calcium intake, vitamin D , and weight-bearing exercise, in addition to any medications.  Last D level was WNL, on low end when taking 4000 IU 4x/week, plus  D from her Ca that she takes once daily and her MVI. Advised she needs to take 4000 IU every day.   Recheck DEXA in 2 years.

## 2024-08-07 ENCOUNTER — Encounter: Payer: Self-pay | Admitting: Family Medicine

## 2024-08-07 ENCOUNTER — Ambulatory Visit (INDEPENDENT_AMBULATORY_CARE_PROVIDER_SITE_OTHER): Admitting: Family Medicine

## 2024-08-07 VITALS — BP 130/70 | HR 64 | Ht 62.5 in | Wt 180.0 lb

## 2024-08-07 DIAGNOSIS — Z9189 Other specified personal risk factors, not elsewhere classified: Secondary | ICD-10-CM | POA: Diagnosis not present

## 2024-08-07 DIAGNOSIS — E559 Vitamin D deficiency, unspecified: Secondary | ICD-10-CM | POA: Diagnosis not present

## 2024-08-07 DIAGNOSIS — M8589 Other specified disorders of bone density and structure, multiple sites: Secondary | ICD-10-CM | POA: Diagnosis not present

## 2024-08-07 MED ORDER — ALENDRONATE SODIUM 70 MG PO TABS: ORAL_TABLET | ORAL | 11 refills | Status: AC

## 2024-08-07 NOTE — Patient Instructions (Addendum)
 Please be sure to take the 4000 IU of vitamin D3 EVERY DAY, in addition to your multivitamin and Calcium with D. This hopefully will get your vitamin D  levels a little higher (currently borderline), which will protect your bones.  We discussed bisphosphonate medications in detail today (alendronate (fosamax weekly), or Actonel or Boniva (monthly) vs Reclast (once yearly infusion). We discussed the importance of getting adequate calcium intake (through diet and supplements, as previously discussed), vitamin D , and weight-bearing exercise at least 2x/week.  We are going to start you on the once weekly alendronate.  Remember to take this with a full glass of water , remain upright (don't lay down or recline) and don't eat or drink anything else for 30 minutes.  If you have significant issues with heartburn, chest pain, or other issues, please contact us --we can then decide if we want to change to a once monthly pill, vs a yearly infusion.  Contact us  if you develop any jaw or hip pain. Contact us  if you develop significant bony/joint pains.  Be sure to let your dentist know that you are on this medication (we may need to stop it for a while if you need significant dental work such as extractions or implants).

## 2024-08-15 ENCOUNTER — Other Ambulatory Visit: Payer: Self-pay | Admitting: Physician Assistant

## 2024-08-15 ENCOUNTER — Other Ambulatory Visit: Payer: Self-pay | Admitting: Family Medicine

## 2024-08-15 DIAGNOSIS — F411 Generalized anxiety disorder: Secondary | ICD-10-CM

## 2024-08-21 ENCOUNTER — Ambulatory Visit

## 2024-08-21 DIAGNOSIS — T63441D Toxic effect of venom of bees, accidental (unintentional), subsequent encounter: Secondary | ICD-10-CM

## 2024-08-23 ENCOUNTER — Other Ambulatory Visit: Payer: Self-pay | Admitting: Family Medicine

## 2024-08-23 DIAGNOSIS — I10 Essential (primary) hypertension: Secondary | ICD-10-CM

## 2024-08-28 DIAGNOSIS — B351 Tinea unguium: Secondary | ICD-10-CM | POA: Diagnosis not present

## 2024-10-16 ENCOUNTER — Ambulatory Visit (INDEPENDENT_AMBULATORY_CARE_PROVIDER_SITE_OTHER)

## 2024-10-16 DIAGNOSIS — T63441D Toxic effect of venom of bees, accidental (unintentional), subsequent encounter: Secondary | ICD-10-CM

## 2024-10-16 NOTE — Progress Notes (Deleted)
 W

## 2024-10-27 ENCOUNTER — Other Ambulatory Visit (HOSPITAL_COMMUNITY): Payer: Self-pay | Admitting: Family Medicine

## 2024-10-27 DIAGNOSIS — Z1231 Encounter for screening mammogram for malignant neoplasm of breast: Secondary | ICD-10-CM

## 2024-12-04 ENCOUNTER — Ambulatory Visit (HOSPITAL_COMMUNITY)

## 2024-12-11 ENCOUNTER — Ambulatory Visit

## 2024-12-13 ENCOUNTER — Ambulatory Visit (HOSPITAL_COMMUNITY)

## 2024-12-27 ENCOUNTER — Ambulatory Visit: Payer: Medicare Other | Admitting: Allergy & Immunology

## 2025-08-08 ENCOUNTER — Encounter: Admitting: Family Medicine

## 2025-08-16 ENCOUNTER — Ambulatory Visit: Payer: Self-pay | Admitting: Family Medicine
